# Patient Record
Sex: Male | Born: 1950 | Race: White | Hispanic: No | Marital: Married | Smoking: Former smoker
Health system: Southern US, Community
[De-identification: ages and names within clinical notes are randomized; demographics above are authoritative.]

## PROBLEM LIST (undated history)

## (undated) DIAGNOSIS — I714 Abdominal aortic aneurysm, without rupture, unspecified: Secondary | ICD-10-CM

## (undated) DIAGNOSIS — I2510 Atherosclerotic heart disease of native coronary artery without angina pectoris: Secondary | ICD-10-CM

## (undated) DIAGNOSIS — I10 Essential (primary) hypertension: Secondary | ICD-10-CM

## (undated) DIAGNOSIS — K7460 Unspecified cirrhosis of liver: Secondary | ICD-10-CM

## (undated) DIAGNOSIS — F329 Major depressive disorder, single episode, unspecified: Secondary | ICD-10-CM

## (undated) DIAGNOSIS — K859 Acute pancreatitis without necrosis or infection, unspecified: Secondary | ICD-10-CM

## (undated) DIAGNOSIS — F32A Depression, unspecified: Secondary | ICD-10-CM

## (undated) HISTORY — PX: CORONARY ARTERY BYPASS GRAFT: SHX141

## (undated) HISTORY — PX: HERNIA REPAIR: SHX51

---

## 2014-12-16 ENCOUNTER — Inpatient Hospital Stay (HOSPITAL_COMMUNITY): Payer: BLUE CROSS/BLUE SHIELD | Admitting: Critical Care Medicine

## 2014-12-16 ENCOUNTER — Inpatient Hospital Stay (HOSPITAL_COMMUNITY): Payer: BLUE CROSS/BLUE SHIELD

## 2014-12-16 ENCOUNTER — Encounter (HOSPITAL_COMMUNITY): Admission: EM | Disposition: E | Payer: Self-pay | Source: Other Acute Inpatient Hospital | Attending: Pulmonary Disease

## 2014-12-16 ENCOUNTER — Encounter (HOSPITAL_COMMUNITY): Payer: Self-pay | Admitting: Adult Health

## 2014-12-16 ENCOUNTER — Other Ambulatory Visit: Payer: Self-pay | Admitting: *Deleted

## 2014-12-16 ENCOUNTER — Inpatient Hospital Stay (HOSPITAL_COMMUNITY)
Admission: EM | Admit: 2014-12-16 | Discharge: 2015-02-04 | DRG: 853 | Disposition: E | Payer: BLUE CROSS/BLUE SHIELD | Source: Other Acute Inpatient Hospital | Attending: Pulmonary Disease | Admitting: Pulmonary Disease

## 2014-12-16 DIAGNOSIS — D689 Coagulation defect, unspecified: Secondary | ICD-10-CM | POA: Diagnosis present

## 2014-12-16 DIAGNOSIS — R6521 Severe sepsis with septic shock: Secondary | ICD-10-CM | POA: Diagnosis present

## 2014-12-16 DIAGNOSIS — E872 Acidosis: Secondary | ICD-10-CM | POA: Diagnosis present

## 2014-12-16 DIAGNOSIS — R0602 Shortness of breath: Secondary | ICD-10-CM

## 2014-12-16 DIAGNOSIS — K767 Hepatorenal syndrome: Secondary | ICD-10-CM | POA: Diagnosis present

## 2014-12-16 DIAGNOSIS — E785 Hyperlipidemia, unspecified: Secondary | ICD-10-CM | POA: Diagnosis present

## 2014-12-16 DIAGNOSIS — F329 Major depressive disorder, single episode, unspecified: Secondary | ICD-10-CM | POA: Diagnosis present

## 2014-12-16 DIAGNOSIS — R109 Unspecified abdominal pain: Secondary | ICD-10-CM | POA: Diagnosis present

## 2014-12-16 DIAGNOSIS — G9340 Encephalopathy, unspecified: Secondary | ICD-10-CM | POA: Diagnosis not present

## 2014-12-16 DIAGNOSIS — A419 Sepsis, unspecified organism: Principal | ICD-10-CM | POA: Diagnosis present

## 2014-12-16 DIAGNOSIS — N179 Acute kidney failure, unspecified: Secondary | ICD-10-CM | POA: Diagnosis present

## 2014-12-16 DIAGNOSIS — E43 Unspecified severe protein-calorie malnutrition: Secondary | ICD-10-CM | POA: Diagnosis present

## 2014-12-16 DIAGNOSIS — R451 Restlessness and agitation: Secondary | ICD-10-CM | POA: Diagnosis not present

## 2014-12-16 DIAGNOSIS — K550 Acute vascular disorders of intestine: Secondary | ICD-10-CM | POA: Diagnosis present

## 2014-12-16 DIAGNOSIS — G9341 Metabolic encephalopathy: Secondary | ICD-10-CM | POA: Diagnosis not present

## 2014-12-16 DIAGNOSIS — E870 Hyperosmolality and hypernatremia: Secondary | ICD-10-CM | POA: Diagnosis not present

## 2014-12-16 DIAGNOSIS — T8144XA Sepsis following a procedure, initial encounter: Secondary | ICD-10-CM

## 2014-12-16 DIAGNOSIS — I9581 Postprocedural hypotension: Secondary | ICD-10-CM | POA: Diagnosis not present

## 2014-12-16 DIAGNOSIS — Z452 Encounter for adjustment and management of vascular access device: Secondary | ICD-10-CM

## 2014-12-16 DIAGNOSIS — Z885 Allergy status to narcotic agent status: Secondary | ICD-10-CM | POA: Diagnosis not present

## 2014-12-16 DIAGNOSIS — K913 Postprocedural intestinal obstruction: Secondary | ICD-10-CM | POA: Diagnosis not present

## 2014-12-16 DIAGNOSIS — I129 Hypertensive chronic kidney disease with stage 1 through stage 4 chronic kidney disease, or unspecified chronic kidney disease: Secondary | ICD-10-CM | POA: Diagnosis present

## 2014-12-16 DIAGNOSIS — D62 Acute posthemorrhagic anemia: Secondary | ICD-10-CM | POA: Diagnosis not present

## 2014-12-16 DIAGNOSIS — T8132XA Disruption of internal operation (surgical) wound, not elsewhere classified, initial encounter: Secondary | ICD-10-CM | POA: Diagnosis not present

## 2014-12-16 DIAGNOSIS — R188 Other ascites: Secondary | ICD-10-CM | POA: Diagnosis present

## 2014-12-16 DIAGNOSIS — E871 Hypo-osmolality and hyponatremia: Secondary | ICD-10-CM | POA: Diagnosis not present

## 2014-12-16 DIAGNOSIS — G8918 Other acute postprocedural pain: Secondary | ICD-10-CM | POA: Diagnosis not present

## 2014-12-16 DIAGNOSIS — R5381 Other malaise: Secondary | ICD-10-CM | POA: Diagnosis not present

## 2014-12-16 DIAGNOSIS — R0600 Dyspnea, unspecified: Secondary | ICD-10-CM

## 2014-12-16 DIAGNOSIS — N183 Chronic kidney disease, stage 3 (moderate): Secondary | ICD-10-CM | POA: Diagnosis present

## 2014-12-16 DIAGNOSIS — R1310 Dysphagia, unspecified: Secondary | ICD-10-CM | POA: Diagnosis not present

## 2014-12-16 DIAGNOSIS — J9811 Atelectasis: Secondary | ICD-10-CM | POA: Diagnosis not present

## 2014-12-16 DIAGNOSIS — K7460 Unspecified cirrhosis of liver: Secondary | ICD-10-CM | POA: Diagnosis present

## 2014-12-16 DIAGNOSIS — Z951 Presence of aortocoronary bypass graft: Secondary | ICD-10-CM

## 2014-12-16 DIAGNOSIS — D638 Anemia in other chronic diseases classified elsewhere: Secondary | ICD-10-CM | POA: Diagnosis present

## 2014-12-16 DIAGNOSIS — J9690 Respiratory failure, unspecified, unspecified whether with hypoxia or hypercapnia: Secondary | ICD-10-CM

## 2014-12-16 DIAGNOSIS — I2510 Atherosclerotic heart disease of native coronary artery without angina pectoris: Secondary | ICD-10-CM | POA: Diagnosis present

## 2014-12-16 DIAGNOSIS — Z87891 Personal history of nicotine dependence: Secondary | ICD-10-CM | POA: Diagnosis not present

## 2014-12-16 DIAGNOSIS — I480 Paroxysmal atrial fibrillation: Secondary | ICD-10-CM | POA: Diagnosis not present

## 2014-12-16 DIAGNOSIS — I714 Abdominal aortic aneurysm, without rupture, unspecified: Secondary | ICD-10-CM

## 2014-12-16 DIAGNOSIS — K7581 Nonalcoholic steatohepatitis (NASH): Secondary | ICD-10-CM | POA: Diagnosis present

## 2014-12-16 DIAGNOSIS — G4733 Obstructive sleep apnea (adult) (pediatric): Secondary | ICD-10-CM | POA: Diagnosis present

## 2014-12-16 DIAGNOSIS — Z4659 Encounter for fitting and adjustment of other gastrointestinal appliance and device: Secondary | ICD-10-CM

## 2014-12-16 DIAGNOSIS — E611 Iron deficiency: Secondary | ICD-10-CM | POA: Diagnosis present

## 2014-12-16 DIAGNOSIS — Z66 Do not resuscitate: Secondary | ICD-10-CM | POA: Diagnosis not present

## 2014-12-16 DIAGNOSIS — R680 Hypothermia, not associated with low environmental temperature: Secondary | ICD-10-CM | POA: Diagnosis not present

## 2014-12-16 DIAGNOSIS — K7290 Hepatic failure, unspecified without coma: Secondary | ICD-10-CM | POA: Diagnosis not present

## 2014-12-16 DIAGNOSIS — D696 Thrombocytopenia, unspecified: Secondary | ICD-10-CM | POA: Diagnosis present

## 2014-12-16 DIAGNOSIS — J9601 Acute respiratory failure with hypoxia: Secondary | ICD-10-CM | POA: Diagnosis not present

## 2014-12-16 DIAGNOSIS — Y848 Other medical procedures as the cause of abnormal reaction of the patient, or of later complication, without mention of misadventure at the time of the procedure: Secondary | ICD-10-CM | POA: Diagnosis not present

## 2014-12-16 DIAGNOSIS — E1165 Type 2 diabetes mellitus with hyperglycemia: Secondary | ICD-10-CM | POA: Diagnosis present

## 2014-12-16 DIAGNOSIS — J9600 Acute respiratory failure, unspecified whether with hypoxia or hypercapnia: Secondary | ICD-10-CM

## 2014-12-16 DIAGNOSIS — K7031 Alcoholic cirrhosis of liver with ascites: Secondary | ICD-10-CM | POA: Diagnosis not present

## 2014-12-16 DIAGNOSIS — K659 Peritonitis, unspecified: Secondary | ICD-10-CM | POA: Diagnosis not present

## 2014-12-16 DIAGNOSIS — K559 Vascular disorder of intestine, unspecified: Secondary | ICD-10-CM | POA: Diagnosis not present

## 2014-12-16 DIAGNOSIS — K6389 Other specified diseases of intestine: Secondary | ICD-10-CM | POA: Diagnosis present

## 2014-12-16 DIAGNOSIS — I4891 Unspecified atrial fibrillation: Secondary | ICD-10-CM | POA: Diagnosis not present

## 2014-12-16 DIAGNOSIS — I4892 Unspecified atrial flutter: Secondary | ICD-10-CM | POA: Diagnosis not present

## 2014-12-16 DIAGNOSIS — Z781 Physical restraint status: Secondary | ICD-10-CM | POA: Diagnosis not present

## 2014-12-16 DIAGNOSIS — I959 Hypotension, unspecified: Secondary | ICD-10-CM | POA: Diagnosis not present

## 2014-12-16 HISTORY — DX: Abdominal aortic aneurysm, without rupture: I71.4

## 2014-12-16 HISTORY — DX: Acute pancreatitis without necrosis or infection, unspecified: K85.90

## 2014-12-16 HISTORY — DX: Unspecified cirrhosis of liver: K74.60

## 2014-12-16 HISTORY — DX: Atherosclerotic heart disease of native coronary artery without angina pectoris: I25.10

## 2014-12-16 HISTORY — PX: LAPAROTOMY: SHX154

## 2014-12-16 HISTORY — DX: Depression, unspecified: F32.A

## 2014-12-16 HISTORY — PX: BOWEL RESECTION: SHX1257

## 2014-12-16 HISTORY — DX: Essential (primary) hypertension: I10

## 2014-12-16 HISTORY — PX: APPLICATION OF WOUND VAC: SHX5189

## 2014-12-16 HISTORY — DX: Major depressive disorder, single episode, unspecified: F32.9

## 2014-12-16 HISTORY — DX: Abdominal aortic aneurysm, without rupture, unspecified: I71.40

## 2014-12-16 LAB — BASIC METABOLIC PANEL
Anion gap: 14 (ref 5–15)
BUN: 34 mg/dL — AB (ref 6–23)
CHLORIDE: 102 mmol/L (ref 96–112)
CO2: 18 mmol/L — AB (ref 19–32)
Calcium: 8.2 mg/dL — ABNORMAL LOW (ref 8.4–10.5)
Creatinine, Ser: 1.75 mg/dL — ABNORMAL HIGH (ref 0.50–1.35)
GFR calc Af Amer: 46 mL/min — ABNORMAL LOW (ref >90)
GFR calc non Af Amer: 39 mL/min — ABNORMAL LOW (ref >90)
Glucose, Bld: 163 mg/dL — ABNORMAL HIGH (ref 70–99)
POTASSIUM: 4.5 mmol/L (ref 3.5–5.1)
SODIUM: 134 mmol/L — AB (ref 135–145)

## 2014-12-16 LAB — GLUCOSE, CAPILLARY
GLUCOSE-CAPILLARY: 107 mg/dL — AB (ref 70–99)
Glucose-Capillary: 102 mg/dL — ABNORMAL HIGH (ref 70–99)
Glucose-Capillary: 139 mg/dL — ABNORMAL HIGH (ref 70–99)
Glucose-Capillary: 142 mg/dL — ABNORMAL HIGH (ref 70–99)

## 2014-12-16 LAB — AMYLASE: Amylase: 112 U/L — ABNORMAL HIGH (ref 0–105)

## 2014-12-16 LAB — CBC
HEMATOCRIT: 31.2 % — AB (ref 39.0–52.0)
HEMOGLOBIN: 10.5 g/dL — AB (ref 13.0–17.0)
MCH: 35.7 pg — AB (ref 26.0–34.0)
MCHC: 33.7 g/dL (ref 30.0–36.0)
MCV: 106.1 fL — AB (ref 78.0–100.0)
Platelets: 85 10*3/uL — ABNORMAL LOW (ref 150–400)
RBC: 2.94 MIL/uL — ABNORMAL LOW (ref 4.22–5.81)
RDW: 16 % — ABNORMAL HIGH (ref 11.5–15.5)
WBC: 9.3 10*3/uL (ref 4.0–10.5)

## 2014-12-16 LAB — PROTIME-INR
INR: 1.53 — ABNORMAL HIGH (ref 0.00–1.49)
PROTHROMBIN TIME: 18.5 s — AB (ref 11.6–15.2)

## 2014-12-16 LAB — COMPREHENSIVE METABOLIC PANEL
ALK PHOS: 110 U/L (ref 39–117)
ALT: 26 U/L (ref 0–53)
ANION GAP: 17 — AB (ref 5–15)
AST: 74 U/L — ABNORMAL HIGH (ref 0–37)
Albumin: 2.6 g/dL — ABNORMAL LOW (ref 3.5–5.2)
BUN: 29 mg/dL — AB (ref 6–23)
CALCIUM: 8.7 mg/dL (ref 8.4–10.5)
CO2: 18 mmol/L — ABNORMAL LOW (ref 19–32)
CREATININE: 1.58 mg/dL — AB (ref 0.50–1.35)
Chloride: 104 mmol/L (ref 96–112)
GFR calc non Af Amer: 45 mL/min — ABNORMAL LOW (ref >90)
GFR, EST AFRICAN AMERICAN: 52 mL/min — AB (ref >90)
GLUCOSE: 111 mg/dL — AB (ref 70–99)
POTASSIUM: 4.4 mmol/L (ref 3.5–5.1)
SODIUM: 139 mmol/L (ref 135–145)
TOTAL PROTEIN: 5.5 g/dL — AB (ref 6.0–8.3)
Total Bilirubin: 2.9 mg/dL — ABNORMAL HIGH (ref 0.3–1.2)

## 2014-12-16 LAB — URINALYSIS, ROUTINE W REFLEX MICROSCOPIC
Bilirubin Urine: NEGATIVE
GLUCOSE, UA: NEGATIVE mg/dL
HGB URINE DIPSTICK: NEGATIVE
Ketones, ur: NEGATIVE mg/dL
Leukocytes, UA: NEGATIVE
Nitrite: NEGATIVE
PROTEIN: NEGATIVE mg/dL
SPECIFIC GRAVITY, URINE: 1.02 (ref 1.005–1.030)
Urobilinogen, UA: 0.2 mg/dL (ref 0.0–1.0)
pH: 5 (ref 5.0–8.0)

## 2014-12-16 LAB — POCT I-STAT 3, ART BLOOD GAS (G3+)
Acid-base deficit: 11 mmol/L — ABNORMAL HIGH (ref 0.0–2.0)
Bicarbonate: 16.1 mEq/L — ABNORMAL LOW (ref 20.0–24.0)
O2 Saturation: 92 %
TCO2: 17 mmol/L (ref 0–100)
pCO2 arterial: 41.5 mmHg (ref 35.0–45.0)
pH, Arterial: 7.196 — CL (ref 7.350–7.450)
pO2, Arterial: 79 mmHg — ABNORMAL LOW (ref 80.0–100.0)

## 2014-12-16 LAB — TRIGLYCERIDES: Triglycerides: 48 mg/dL (ref <150)

## 2014-12-16 LAB — PROCALCITONIN: Procalcitonin: 4.9 ng/mL

## 2014-12-16 LAB — SURGICAL PCR SCREEN
MRSA, PCR: NEGATIVE
Staphylococcus aureus: NEGATIVE

## 2014-12-16 LAB — LACTIC ACID, PLASMA
LACTIC ACID, VENOUS: 7.2 mmol/L — AB (ref 0.5–2.0)
Lactic Acid, Venous: 6.4 mmol/L (ref 0.5–2.0)

## 2014-12-16 LAB — ABO/RH: ABO/RH(D): O POS

## 2014-12-16 LAB — LIPASE, BLOOD: LIPASE: 51 U/L (ref 11–59)

## 2014-12-16 SURGERY — LAPAROTOMY, EXPLORATORY
Anesthesia: General | Site: Abdomen

## 2014-12-16 MED ORDER — SODIUM CHLORIDE 0.9 % IV BOLUS (SEPSIS)
500.0000 mL | Freq: Once | INTRAVENOUS | Status: AC
  Administered 2014-12-16: 500 mL via INTRAVENOUS

## 2014-12-16 MED ORDER — ETOMIDATE 2 MG/ML IV SOLN
INTRAVENOUS | Status: DC | PRN
  Administered 2014-12-16: 20 mg via INTRAVENOUS

## 2014-12-16 MED ORDER — LIDOCAINE HCL (CARDIAC) 20 MG/ML IV SOLN
INTRAVENOUS | Status: AC
  Filled 2014-12-16: qty 5

## 2014-12-16 MED ORDER — SODIUM CHLORIDE 0.9 % IV SOLN
Freq: Once | INTRAVENOUS | Status: DC
Start: 2014-12-16 — End: 2014-12-20

## 2014-12-16 MED ORDER — LIDOCAINE HCL (CARDIAC) 20 MG/ML IV SOLN
INTRAVENOUS | Status: DC | PRN
  Administered 2014-12-16: 70 mg via INTRAVENOUS

## 2014-12-16 MED ORDER — PHENYLEPHRINE HCL 10 MG/ML IJ SOLN
10.0000 mg | INTRAMUSCULAR | Status: DC | PRN
  Administered 2014-12-16: 50 ug/min via INTRAVENOUS

## 2014-12-16 MED ORDER — PHENYLEPHRINE HCL 10 MG/ML IJ SOLN
0.0000 ug/min | INTRAVENOUS | Status: DC
  Administered 2014-12-16: 80 ug/min via INTRAVENOUS
  Administered 2014-12-17: 200 ug/min via INTRAVENOUS
  Administered 2014-12-17: 65 ug/min via INTRAVENOUS
  Administered 2014-12-17: 200 ug/min via INTRAVENOUS
  Administered 2014-12-18: 45 ug/min via INTRAVENOUS
  Administered 2014-12-18 (×2): 200 ug/min via INTRAVENOUS
  Administered 2014-12-19: 130 ug/min via INTRAVENOUS
  Administered 2014-12-19: 120 ug/min via INTRAVENOUS
  Administered 2014-12-20: 5 ug/min via INTRAVENOUS
  Administered 2014-12-20: 120 ug/min via INTRAVENOUS
  Administered 2014-12-20: 115 ug/min via INTRAVENOUS
  Filled 2014-12-16 (×16): qty 4

## 2014-12-16 MED ORDER — FENTANYL CITRATE 0.05 MG/ML IJ SOLN
INTRAMUSCULAR | Status: AC
  Filled 2014-12-16: qty 5

## 2014-12-16 MED ORDER — FENTANYL CITRATE 0.05 MG/ML IJ SOLN
25.0000 ug | INTRAMUSCULAR | Status: DC | PRN
  Administered 2014-12-16 – 2014-12-17 (×5): 100 ug via INTRAVENOUS
  Administered 2014-12-20 – 2014-12-21 (×3): 50 ug via INTRAVENOUS
  Administered 2014-12-21 – 2014-12-23 (×4): 100 ug via INTRAVENOUS
  Administered 2014-12-23 (×2): 25 ug via INTRAVENOUS
  Administered 2014-12-23: 50 ug via INTRAVENOUS
  Administered 2014-12-24 – 2014-12-30 (×22): 100 ug via INTRAVENOUS
  Filled 2014-12-16 (×37): qty 2

## 2014-12-16 MED ORDER — ONDANSETRON HCL 4 MG/2ML IJ SOLN: 4.0000 mg | Freq: Four times a day (QID) | INTRAMUSCULAR | Status: DC | PRN

## 2014-12-16 MED ORDER — FENTANYL CITRATE 0.05 MG/ML IJ SOLN: 100.0000 ug | INTRAMUSCULAR | Status: DC | PRN

## 2014-12-16 MED ORDER — CETYLPYRIDINIUM CHLORIDE 0.05 % MT LIQD: 7.0000 mL | Freq: Four times a day (QID) | OROMUCOSAL | Status: DC

## 2014-12-16 MED ORDER — NOREPINEPHRINE BITARTRATE 1 MG/ML IV SOLN
2.0000 ug/min | INTRAVENOUS | Status: DC
  Administered 2014-12-16: 5 ug/min via INTRAVENOUS
  Administered 2014-12-17: 25 ug/min via INTRAVENOUS
  Filled 2014-12-16 (×3): qty 16

## 2014-12-16 MED ORDER — SODIUM CHLORIDE 0.9 % IV SOLN: 250.0000 mL | INTRAVENOUS | Status: DC | PRN

## 2014-12-16 MED ORDER — SODIUM CHLORIDE 0.9 % IV SOLN
INTRAVENOUS | Status: DC | PRN
  Administered 2014-12-16: 17:00:00 via INTRAVENOUS

## 2014-12-16 MED ORDER — CHLORHEXIDINE GLUCONATE 0.12 % MT SOLN: 15.0000 mL | Freq: Two times a day (BID) | OROMUCOSAL | Status: DC

## 2014-12-16 MED ORDER — ROCURONIUM BROMIDE 100 MG/10ML IV SOLN
INTRAVENOUS | Status: DC | PRN
  Administered 2014-12-16: 10 mg via INTRAVENOUS
  Administered 2014-12-16: 20 mg via INTRAVENOUS

## 2014-12-16 MED ORDER — NOREPINEPHRINE BITARTRATE 1 MG/ML IV SOLN: 2.0000 ug/min | INTRAVENOUS | Status: DC

## 2014-12-16 MED ORDER — SUCCINYLCHOLINE CHLORIDE 20 MG/ML IJ SOLN
INTRAMUSCULAR | Status: AC
  Filled 2014-12-16: qty 1

## 2014-12-16 MED ORDER — PIPERACILLIN-TAZOBACTAM 3.375 G IVPB
3.3750 g | Freq: Three times a day (TID) | INTRAVENOUS | Status: AC
  Administered 2014-12-16 – 2014-12-23 (×23): 3.375 g via INTRAVENOUS
  Filled 2014-12-16 (×24): qty 50

## 2014-12-16 MED ORDER — 0.9 % SODIUM CHLORIDE (POUR BTL) OPTIME
TOPICAL | Status: DC | PRN
  Administered 2014-12-16: 2000 mL

## 2014-12-16 MED ORDER — ARTIFICIAL TEARS OP OINT
TOPICAL_OINTMENT | OPHTHALMIC | Status: DC | PRN
  Administered 2014-12-16: 1 via OPHTHALMIC

## 2014-12-16 MED ORDER — LACTATED RINGERS IV SOLN
INTRAVENOUS | Status: DC | PRN
  Administered 2014-12-16: 16:00:00 via INTRAVENOUS

## 2014-12-16 MED ORDER — INSULIN ASPART 100 UNIT/ML ~~LOC~~ SOLN
0.0000 [IU] | SUBCUTANEOUS | Status: DC
  Administered 2014-12-16 – 2014-12-19 (×12): 1 [IU] via SUBCUTANEOUS
  Administered 2014-12-19 (×2): 2 [IU] via SUBCUTANEOUS
  Administered 2014-12-19: 1 [IU] via SUBCUTANEOUS
  Administered 2014-12-19: 2 [IU] via SUBCUTANEOUS
  Administered 2014-12-20 – 2014-12-21 (×7): 1 [IU] via SUBCUTANEOUS
  Administered 2014-12-21: 2 [IU] via SUBCUTANEOUS
  Administered 2014-12-21 – 2014-12-22 (×4): 1 [IU] via SUBCUTANEOUS
  Administered 2014-12-22: 2 [IU] via SUBCUTANEOUS
  Administered 2014-12-22: 1 [IU] via SUBCUTANEOUS
  Administered 2014-12-23: 2 [IU] via SUBCUTANEOUS
  Administered 2014-12-23 (×2): 1 [IU] via SUBCUTANEOUS

## 2014-12-16 MED ORDER — SODIUM CHLORIDE 0.9 % IV BOLUS (SEPSIS)
500.0000 mL | INTRAVENOUS | Status: DC | PRN
  Administered 2014-12-16 – 2014-12-18 (×7): 500 mL via INTRAVENOUS
  Filled 2014-12-16 (×7): qty 500

## 2014-12-16 MED ORDER — ALBUTEROL SULFATE (2.5 MG/3ML) 0.083% IN NEBU: 2.5000 mg | INHALATION_SOLUTION | RESPIRATORY_TRACT | Status: DC | PRN

## 2014-12-16 MED ORDER — PROPOFOL 10 MG/ML IV BOLUS
INTRAVENOUS | Status: AC
  Filled 2014-12-16: qty 20

## 2014-12-16 MED ORDER — FENTANYL CITRATE 0.05 MG/ML IJ SOLN
INTRAMUSCULAR | Status: DC | PRN
  Administered 2014-12-16 (×2): 50 ug via INTRAVENOUS

## 2014-12-16 MED ORDER — ARTIFICIAL TEARS OP OINT
TOPICAL_OINTMENT | OPHTHALMIC | Status: AC
  Filled 2014-12-16: qty 3.5

## 2014-12-16 MED ORDER — KCL IN DEXTROSE-NACL 20-5-0.45 MEQ/L-%-% IV SOLN
INTRAVENOUS | Status: DC
  Administered 2014-12-16: 15:00:00 via INTRAVENOUS
  Administered 2014-12-17: 100 mL via INTRAVENOUS
  Filled 2014-12-16 (×5): qty 1000

## 2014-12-16 MED ORDER — SUCCINYLCHOLINE CHLORIDE 20 MG/ML IJ SOLN
INTRAMUSCULAR | Status: DC | PRN
  Administered 2014-12-16: 120 mg via INTRAVENOUS

## 2014-12-16 MED ORDER — LACTATED RINGERS IV SOLN
INTRAVENOUS | Status: DC | PRN
  Administered 2014-12-16: 17:00:00 via INTRAVENOUS

## 2014-12-16 MED ORDER — PANTOPRAZOLE SODIUM 40 MG IV SOLR
40.0000 mg | INTRAVENOUS | Status: DC
Start: 2014-12-16 — End: 2014-12-23
  Administered 2014-12-16 – 2014-12-22 (×7): 40 mg via INTRAVENOUS
  Filled 2014-12-16 (×10): qty 40

## 2014-12-16 MED ORDER — HYDROMORPHONE HCL 1 MG/ML IJ SOLN: 0.2500 mg | INTRAMUSCULAR | Status: DC | PRN

## 2014-12-16 MED ORDER — SODIUM CHLORIDE 0.9 % IV BOLUS (SEPSIS)
1000.0000 mL | Freq: Once | INTRAVENOUS | Status: AC
  Administered 2014-12-16: 1000 mL via INTRAVENOUS

## 2014-12-16 MED ORDER — PROPOFOL 10 MG/ML IV EMUL
0.0000 ug/kg/min | INTRAVENOUS | Status: DC
  Administered 2014-12-16: 10 ug/kg/min via INTRAVENOUS
  Administered 2014-12-17: 20 ug/kg/min via INTRAVENOUS
  Filled 2014-12-16 (×4): qty 100

## 2014-12-16 MED ORDER — ROCURONIUM BROMIDE 50 MG/5ML IV SOLN
INTRAVENOUS | Status: AC
  Filled 2014-12-16: qty 1

## 2014-12-16 SURGICAL SUPPLY — 54 items
BLADE SURG ROTATE 9660 (MISCELLANEOUS) IMPLANT
CANISTER SUCTION 2500CC (MISCELLANEOUS) ×3 IMPLANT
CANISTER WOUND CARE 500ML ATS (WOUND CARE) ×3 IMPLANT
CHLORAPREP W/TINT 26ML (MISCELLANEOUS) ×3 IMPLANT
COVER MAYO STAND STRL (DRAPES) IMPLANT
COVER SURGICAL LIGHT HANDLE (MISCELLANEOUS) ×3 IMPLANT
DRAPE LAPAROSCOPIC ABDOMINAL (DRAPES) ×3 IMPLANT
DRAPE PROXIMA HALF (DRAPES) IMPLANT
DRAPE UTILITY XL STRL (DRAPES) IMPLANT
DRAPE WARM FLUID 44X44 (DRAPE) ×3 IMPLANT
DRSG OPSITE POSTOP 4X10 (GAUZE/BANDAGES/DRESSINGS) IMPLANT
DRSG OPSITE POSTOP 4X8 (GAUZE/BANDAGES/DRESSINGS) IMPLANT
ELECT BLADE 6.5 EXT (BLADE) IMPLANT
ELECT CAUTERY BLADE 6.4 (BLADE) ×3 IMPLANT
ELECT REM PT RETURN 9FT ADLT (ELECTROSURGICAL) ×3
ELECTRODE REM PT RTRN 9FT ADLT (ELECTROSURGICAL) ×1 IMPLANT
GEL ULTRASOUND 20GR AQUASONIC (MISCELLANEOUS) ×3 IMPLANT
GLOVE BIO SURGEON STRL SZ8 (GLOVE) ×3 IMPLANT
GLOVE BIOGEL PI IND STRL 6.5 (GLOVE) ×1 IMPLANT
GLOVE BIOGEL PI IND STRL 8 (GLOVE) ×1 IMPLANT
GLOVE BIOGEL PI INDICATOR 6.5 (GLOVE) ×2
GLOVE BIOGEL PI INDICATOR 8 (GLOVE) ×2
GLOVE ECLIPSE 6.5 STRL STRAW (GLOVE) ×3 IMPLANT
GOWN STRL REUS W/ TWL LRG LVL3 (GOWN DISPOSABLE) ×1 IMPLANT
GOWN STRL REUS W/ TWL XL LVL3 (GOWN DISPOSABLE) ×1 IMPLANT
GOWN STRL REUS W/TWL LRG LVL3 (GOWN DISPOSABLE) ×2
GOWN STRL REUS W/TWL XL LVL3 (GOWN DISPOSABLE) ×2
KIT BASIN OR (CUSTOM PROCEDURE TRAY) ×3 IMPLANT
KIT ROOM TURNOVER OR (KITS) ×3 IMPLANT
LIGASURE IMPACT 36 18CM CVD LR (INSTRUMENTS) ×3 IMPLANT
NS IRRIG 1000ML POUR BTL (IV SOLUTION) ×6 IMPLANT
PACK GENERAL/GYN (CUSTOM PROCEDURE TRAY) ×3 IMPLANT
PAD ARMBOARD 7.5X6 YLW CONV (MISCELLANEOUS) ×3 IMPLANT
PENCIL BUTTON HOLSTER BLD 10FT (ELECTRODE) IMPLANT
RELOAD PROXIMATE 75MM BLUE (ENDOMECHANICALS) ×3 IMPLANT
SPECIMEN JAR LARGE (MISCELLANEOUS) IMPLANT
SPONGE ABDOMINAL VAC ABTHERA (MISCELLANEOUS) ×3 IMPLANT
SPONGE LAP 18X18 X RAY DECT (DISPOSABLE) ×6 IMPLANT
STAPLER PROXIMATE 75MM BLUE (STAPLE) ×3 IMPLANT
STAPLER VISISTAT 35W (STAPLE) ×3 IMPLANT
SUCTION POOLE TIP (SUCTIONS) ×3 IMPLANT
SUT PDS AB 1 TP1 96 (SUTURE) IMPLANT
SUT VIC AB 2-0 CT1 27 (SUTURE) ×2
SUT VIC AB 2-0 CT1 TAPERPNT 27 (SUTURE) ×1 IMPLANT
SUT VIC AB 2-0 SH 18 (SUTURE) ×3 IMPLANT
SUT VIC AB 3-0 SH 18 (SUTURE) ×3 IMPLANT
SUT VICRYL AB 2 0 TIES (SUTURE) ×3 IMPLANT
SUT VICRYL AB 3 0 TIES (SUTURE) ×3 IMPLANT
TOWEL OR 17X24 6PK STRL BLUE (TOWEL DISPOSABLE) ×3 IMPLANT
TOWEL OR 17X26 10 PK STRL BLUE (TOWEL DISPOSABLE) ×3 IMPLANT
TRAY FOLEY CATH 16FRSI W/METER (SET/KITS/TRAYS/PACK) ×3 IMPLANT
TUBE CONNECTING 12'X1/4 (SUCTIONS)
TUBE CONNECTING 12X1/4 (SUCTIONS) IMPLANT
YANKAUER SUCT BULB TIP NO VENT (SUCTIONS) IMPLANT

## 2014-12-16 NOTE — H&P (Signed)
PULMONARY / CRITICAL CARE MEDICINE   Name: Kyle West MRN: [id number] DOB: [birth year]    ADMISSION DATE:  12/30/2014   REFERRING MD :  OSH  CHIEF COMPLAINT:  abd pain   INITIAL PRESENTATION: 64yo male with hx HTN, CAD s/p CABG, cirrhosis of unclear etiology (followed at [facility]), reported recent hx pancreatitis.  Presented to OSH 2/11 with 2 day hx worsening abd pain, nausea and vomiting.  CT at outside hospital revealed pneumatosis and infrarenal AAA.  He was tx to [facility] for further eval with concern for ischemic bowel.    STUDIES:  CT abd/pelvis 2/11 (osh)>>> dilation and pneumatosis of mid and distal small bowel with moderate ascites.  Air in smv and mpv and extending into hepatic veins.  No gi obstruction.  Infrarenal AAA.  Cirrhotic changes in liver.    SIGNIFICANT EVENTS:    HISTORY OF PRESENT ILLNESS:  64yo male with hx HTN, CAD s/p CABG, cirrhosis, reported recent hx pancreatitis.  Presented to OSH 2/11 with 2 day hx worsening abd pain, nausea and vomiting.  CT at outside hospital revealed pneumatosis and infrarenal AAA.  He was tx to [facility] for further eval with concern for ischemic bowel.  Has had intermittent generalized abdominal pain, lasting about 1.5 hours each time, then would resolve on its own associated with nausea and vomiting.    PAST MEDICAL HISTORY :   has a past medical history of Cirrhosis; CAD (coronary artery disease); Hypertension; Pancreatitis; Depression; and AAA (abdominal aortic aneurysm) without rupture.  has past surgical history that includes Hernia repair and Coronary artery bypass graft. Prior to Admission medications   Not on File   Allergies  Allergen Reactions  . Oxycodone Hives and Other (See Comments)    Hallucinations    FAMILY HISTORY:  has no family status information on file.  SOCIAL HISTORY:  reports that he has quit smoking. His smoking use included Cigarettes. He smoked 1.50 packs per day. He quit smokeless tobacco use about 33  years ago. He reports that he does not drink alcohol.  REVIEW OF SYSTEMS:  As per HPI - All other systems reviewed and were neg.    SUBJECTIVE:   VITAL SIGNS: Pulse Rate:  [103-116] 107 (02/11 1400) Resp:  [23-26] 25 (02/11 1400) BP: (91-116)/(55-62) 91/61 mmHg (02/11 1400) SpO2:  [90 %-95 %] 95 % (02/11 1400) Weight:  [233 lb 11 oz (106 kg)] 233 lb 11 oz (106 kg) (02/11 1200) HEMODYNAMICS:   VENTILATOR SETTINGS:   INTAKE / OUTPUT:  Intake/Output Summary (Last 24 hours) at 12/07/2014 1441 Last data filed at 12/18/2014 1430  Gross per 24 hour  Intake      0 ml  Output    100 ml  Net   -100 ml    PHYSICAL EXAMINATION: General:  Pleasant, chronically ill appearing male, NAD  Neuro:  Awake, alert, appropriate, MAE  HEENT:  Mm moist, no JVD  Cardiovascular:  s1s2 rrr Lungs:  resps even non labored on [place], diminished bases  Abdomen:  Round, mildly distended, hypoactive BS, only mildly tender diffusely and more so in RUQ  Musculoskeletal:  Warm and dry, no edema   LABS:  CBC No results for input(s): WBC, HGB, HCT, PLT in the last 168 hours. Coag's  Recent Labs Lab 12/15/2014 1325  INR 1.53*   BMET  Recent Labs Lab 12/31/2014 1325  NA 139  K 4.4  CL 104  CO2 18*  BUN 29*  CREATININE 1.58*  GLUCOSE 111*   Electrolytes  Recent Labs Lab 02/05/2015 1325  CALCIUM 8.7   Sepsis Markers No results for input(s): LATICACIDVEN, PROCALCITON, O2SATVEN in the last 168 hours. ABG No results for input(s): PHART, PCO2ART, PO2ART in the last 168 hours. Liver Enzymes  Recent Labs Lab 02/05/2015 1325  AST 74*  ALT 26  ALKPHOS 110  BILITOT 2.9*  ALBUMIN 2.6*   Cardiac Enzymes No results for input(s): TROPONINI, PROBNP in the last 168 hours. Glucose No results for input(s): GLUCAP in the last 168 hours.  Imaging No results found.   ASSESSMENT / PLAN:   Cirrhosis  Small bowel pneumatosis  Portal venous and mesenteric vein gas  Ascites   Plan:  Surgery following   No acute indication for urgent surgical intervention  NPO  IV abx  - Zosyn  LFT's pending  F/u coags  F/u lactate, pct  Holding all PO's for now - need to resume rifaximin home med once able  Cbc, bmet in am    AAA - 8cm(length) x4cm x4cm -  Plan:  Seen by [facility] - doubt aneurysm is contributing to clinical picture or abd pain, suspect chronic. No evidence rupture outpt f/u    AKI - mild.  ??Baseline Scr.  Plan:  Gentle volume  Chem pending now  Chem in am   Hx HTN  CAD s/p CABG  Hyperlipidemia  Plan:  Hold home lopressor, lasix, aldactone, statin    DM Plan:  SSI     FAMILY  - Updates: no family available 2/11.  Pt updated at length   - Inter-disciplinary family meet or Palliative Care meeting due by:  2/18   Will monitor overnight in ICU, trend lactate, wbc and watch BP and abd exam.  If remains stable could likely tx out in am and back to [facility] (who originally accepted pt in transfer).   Dirk DressKaty Whiteheart, NP 07-18-15  2:41 PM Pager: [phone] or [phone]    PCCM ATTENDING: I have reviewed pt's initial presentation, consultants notes and hospital database in detail.  The above assessment and plan was formulated under my direction.  In summary: Bowel ischemia/infarction AAA Initially, he was admitted with a plan for expectant mgmt but with rising lactate and was taken to OR for ex lap PCCM will see post op  Billy Fischeravid Simonds, MD;  PCCM service; [place] [phone]

## 2014-12-16 NOTE — Progress Notes (Signed)
CRITICAL VALUE ALERT  Critical value received:  Lactic Acid 7.2  Date of notification:  12/16/13  Time of notification:  1450  Critical value read back:Yes.    Nurse who received alert:  Viviano SimasLauren Ladye Macnaughton RN  MD notified (1st page):  Simonds  Time of first page:  1450  MD notified (2nd page):  Time of second page:  Responding MD:  Sung AmabileSimonds  Time MD responded:  1455

## 2014-12-16 NOTE — Progress Notes (Signed)
Patient ID: Kyle West, male   DOB: 02-19-1951, 64 y.o.   MRN: [id number]  Increased abdominal pain, lactic acid 7.2.  To OR urgently for exploration.  Risks of surgery discussed with the patient, son and wife.  These include infection, bleeding, injury to surrounding structures, possible ostomy.  Verbalizes understanding and wishes to proceed.  Type and screen Type and hold 2 units of FFP On zosyn No chemical anticoagulation. INR 1.5  Lavina Resor, ANP-BC

## 2014-12-16 NOTE — H&P (View-Only) (Signed)
Patient ID: Kyle West, male   DOB: 10/08/1951, 63 y.o.   MRN: [id number]  Increased abdominal pain, lactic acid 7.2.  To OR urgently for exploration.  Risks of surgery discussed with the patient, son and wife.  These include infection, bleeding, injury to surrounding structures, possible ostomy.  Verbalizes understanding and wishes to proceed.  Type and screen Type and hold 2 units of FFP On zosyn No chemical anticoagulation. INR 1.5  Khyran Riera, ANP-BC  

## 2014-12-16 NOTE — Progress Notes (Signed)
Called by RN for increasing abd pain, borderline hypotension and worsening lactate (2.4->7.2).  Surgery notified.  Fluid bolus ordered.  Anticipate OR for exp lap.  Will f/u post op.    Katy July Nickson, NP PaDirk Dressger: [phone] or [phone]) [phone]

## 2014-12-16 NOTE — Interval H&P Note (Signed)
History and Physical Interval Note:  12/17/2014 3:54 PM  Kyle West  has presented today for surgery, with the diagnosis of ischemic bowel  The various methods of treatment have been discussed with the patient and family. After consideration of risks, benefits and other options for treatment, the patient has consented to  Procedure(s): EXPLORATORY LAPAROTOMY (N/A) as a surgical intervention .  The patient's history has been reviewed, patient examined, no change in status, stable for surgery.  I have reviewed the patient's chart and labs.  Questions were answered to the patient's satisfaction.   Pt with worsening metabolic acidosis and lactic acidosis and worsen organ failure.  Recommend ex lap to evaluated and potentially treat intraabdominal pathology.  May need extensive small bowel resection. The procedure has been discussed with the patient.  Alternative therapies have been discussed with the patient. Bowel resection,  Ostomy.   Operative risks include bleeding,  Infection,  Organ injury,  Nerve injury,  Blood vessel injury,  DVT,  Pulmonary embolism,  Death,  And possible reoperation.  Medical management risks include worsening of present situation.  The success of the procedure is 50 -90 % at treating patients symptoms.  The patient understands and agrees to proceed.  Gerritt Galentine A.

## 2014-12-16 NOTE — Consult Note (Signed)
Consult Note  Patient name: Kyle West MRN: [id number] DOB: 30-Jan-1951 Sex: male  Consulting Physician:  Critical care medicine  Reason for Consult: No chief complaint on file.   HISTORY OF PRESENT ILLNESS: This is a 64 year old gentleman who arrives in transfer from [facility].  He had a CT scan which revealed portal venous gas as well as pneumatosis.  In addition, there was reports of a 8 cm aneurysm.  Has been having intermittent abdominal pain for a prolonged period of time but it got worse yesterday.  This was associated with nausea and vomiting.  Patient has a history of cirrhosis.  He has a hepatologist at [facility]  The patient is medically managed for hypertension and coronary artery disease.  He is a former smoker.      Past Medical History  Diagnosis Date  . Cirrhosis   . CAD (coronary artery disease)   . Hypertension   . Pancreatitis   . Depression     Past Surgical History  Procedure Laterality Date  . Hernia repair    . Coronary artery bypass graft      History   Social History  . Marital Status: Married    Spouse Name: N/A  . Number of Children: N/A  . Years of Education: N/A   Occupational History  . Not on file.   Social History Main Topics  . Smoking status: Former Smoker -- 1.50 packs/day    Types: Cigarettes  . Smokeless tobacco: Former Neurosurgeon    Quit date: 12/16/1981  . Alcohol Use: No  . Drug Use: Not on file  . Sexual Activity: Not on file   Other Topics Concern  . Not on file   Social History Narrative  . No narrative on file    History reviewed. No pertinent family history.  Allergies as of Dec 25, 2014 - Review Complete 12/25/14  Allergen Reaction Noted  . Oxycodone Hives and Other (See Comments) Dec 25, 2014    No current facility-administered medications on file prior to encounter.   No current outpatient prescriptions on file prior to encounter.     REVIEW OF SYSTEMS: Cardiovascular: No chest pain,  chest pressure, palpitations, orthopnea, or dyspnea on exertion. No claudication or rest pain,  No history of DVT or phlebitis. Pulmonary: No productive cough, asthma or wheezing. Neurologic: No weakness, paresthesias, aphasia, or amaurosis. No dizziness. Hematologic: No bleeding problems or clotting disorders. Musculoskeletal: No joint pain or joint swelling. Gastrointestinal:  right upper quadrant pain  Genitourinary: No dysuria or hematuria. Psychiatric:: No history of major depression. Integumentary: No rashes or ulcers. Constitutional: No fever or chills.  PHYSICAL EXAMINATION: General: The patient appears their stated age.  Vital signs are BP 103/57 mmHg  Pulse 103  Resp 26  Ht  (1.854 m)  Wt 233 lb 11 oz (106 kg)  BMI 30.84 kg/m2  SpO2 93% Pulmonary: Respirations are non-labored HEENT:  No gross abnormalities Abdomen:  mild discomfort with deep palpation particularly in the right upper quadrant.   Musculoskeletal: There are no major deformities.   Neurologic: No focal weakness or paresthesias are detected, Skin: There are no ulcer or rashes noted. Psychiatric: The patient has normal affect. Cardiovascular: There is a regular rate and rhythm without significant murmur appreciated. palpable femoral pulses.  I cannot palpate pedal pulses   Diagnostic Studies: I have personally reviewed his outside CT scan.  The vascular findings include a 4.3 cm infrarenal abdominal aortic aneurysm with no signs of  rupture or pending rupture.    Assessment:  Abdominal aortic aneurysm Plan I do not think that the patient's aneurysm is contributing to the patient's symptoms or current clinical picture.  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  There is no evidence of an embolic process within the mesenteric vascular, distally.  Should the patient require exploration for the pneumatosis and portal venous  gas, I would be available to evaluate blood flow to his intestines.  However with the patient's cirrhosis, major vascular reconstruction would likely have significant morbidity.  Please contact me if I can be of further assistance.  I will schedule the patient for follow-up evaluation of his aneurysm in 6 months, however I suppose it would be best for him to have this followed closer to home.     Jorge NyV. Wells Burgandy Hackworth IV, M.D. [facility] Office: [phone] Pager:  [phone]

## 2014-12-16 NOTE — Progress Notes (Signed)
PCCM Progress Note  Pt back from OR after ex-lap with small bowel resection and application of wound vac.  Still on ventilator and now starting to wake up.  Vent and sedation orders entered.  Central line placed as pt currently on vasopressors through PIV.   Rutherford Guysahul Joie Hipps, [place] - C [facility] Pgr: [phone]  or [phone] 07-06-15, 6:27 PM

## 2014-12-16 NOTE — Consult Note (Signed)
Reason for Consult: pneumatosis, portal venous and mesenteric vein gas Referring Physician: Dr. Billy Fischer   HPI: This is a 64 year old male with a history of CAD s/p CABG, DM II, hypertension, right inguinal hernia repair, necrotizing pancreatitis and cirrhosis who was transferred from outside facility with reported pneumatosis, moderate ascites, hepatic portal venous gas and infrarenal AAA.  We have been asked to evaluate for above CT findings and concerns for ischemic bowel.  The patient reports intermittent abdominal pain at HS that would last about 1.5 hours and would resolve on its own.  Yesterday, he developed worsening abdominal pain associated with nausea and vomiting.  Location is general.  No aggravating or alleviating factors.  Modifying factors include; rolaids.  He has reported pancreatitis, however, etiology is unclear and the patient cannot tell us much more about that.  His work up shows; CT of abdomen and pelvis shows small bowel pneumatosis and portal venous and mesenteric vein gas along with a 8cm(in length) x4x4cm AAA, moderate amount of ascites with cirrhotic changes noted, otherwise, unremarkable.  White count is normal.  Lactic acid is mildly elevated at 2.4.  LFTs are mildly elevated likely 2/2 his cirrhosis.    He has a hepatologist at [facility], etiology of cirrhosis is unclear.    Past Medical History  Diagnosis Date  . Cirrhosis   . CAD (coronary artery disease)   . Hypertension   . Pancreatitis   . Depression     Past Surgical History  Procedure Laterality Date  . Hernia repair    . Coronary artery bypass graft      History reviewed. No pertinent family history.  Social History:  reports that he has quit smoking. His smoking use included Cigarettes. He smoked 1.50 packs per day. He quit smokeless tobacco use about 33 years ago. He reports that he does not drink alcohol. His drug history is not on file.  Allergies:  Allergies  Allergen Reactions  . Oxycodone      Medications:  Prior to Admission medications   Not on File     No results found for this or any previous visit (from the past 48 hour(s)).  No results found.  Review of Systems  All other systems reviewed and are negative.  Blood pressure 103/57, pulse 103, resp. rate 26, height  (1.854 m), weight 233 lb 11 oz (106 kg), SpO2 93 %. Physical Exam  Constitutional: He is oriented to person, place, and time. He appears well-developed and well-nourished. No distress.  HENT:  Head: Normocephalic and atraumatic.  Eyes: Scleral icterus is present.  Cardiovascular: Normal rate, regular rhythm, normal heart sounds and intact distal pulses.  Exam reveals no gallop and no friction rub.   No murmur heard. Respiratory: Effort normal and breath sounds normal. No respiratory distress. He has no wheezes. He has no rales. He exhibits no tenderness.  GI: Soft. Bowel sounds are normal. He exhibits no distension and no mass. There is no rebound and no guarding.  Mild TTP to RUQ  Musculoskeletal: Normal range of motion. He exhibits no edema or tenderness.  Neurological: He is alert and oriented to person, place, and time.  Skin: Skin is warm and dry. He is not diaphoretic.  Psychiatric: He has a normal mood and affect. His behavior is normal. Judgment and thought content normal.    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  He does not have an acute abdomen and no surgical indications.  Recommend NPO, IV antibiotics.  We will closely follow his abdominal exam.  If he worsens, he may need an operation.  Thank you for the consult.  We will follow along.    Hazelgrace Bonham ANP-BC 09-03-15, 12:35 PM

## 2014-12-16 NOTE — Anesthesia Preprocedure Evaluation (Addendum)
Anesthesia Evaluation  Patient identified by MRN, date of birth, ID band Patient awake    Reviewed: Allergy & Precautions, NPO status , Patient's Chart, lab work & pertinent test results  Airway Mallampati: II   Neck ROM: full    Dental  (+) Dental Advisory Given   Pulmonary former smoker,  breath sounds clear to auscultation        Cardiovascular hypertension, + CAD, + CABG and + Peripheral Vascular Disease Rhythm:regular Rate:Normal     Neuro/Psych Depression    GI/Hepatic   Endo/Other    Renal/GU      Musculoskeletal   Abdominal   Peds  Hematology   Anesthesia Other Findings   Reproductive/Obstetrics                            Anesthesia Physical Anesthesia Plan  ASA: III and emergent  Anesthesia Plan: General   Post-op Pain Management:    Induction: Intravenous  Airway Management Planned: Oral ETT  Additional Equipment:   Intra-op Plan:   Post-operative Plan: Possible Post-op intubation/ventilation  Informed Consent: I have reviewed the patients History and Physical, chart, labs and discussed the procedure including the risks, benefits and alternatives for the proposed anesthesia with the patient or authorized representative who has indicated his/her understanding and acceptance.   Dental advisory given  Plan Discussed with: Anesthesiologist and Surgeon  Anesthesia Plan Comments:         Anesthesia Quick Evaluation

## 2014-12-16 NOTE — Progress Notes (Signed)
CRITICAL VALUE ALERT  Critical value received:  Lactic acid 6.4  Date of notification:  12/23/2014  Time of notification:  2230  Critical value read back:Yes.    Nurse who received alert:  Gennie AlmaHeather Keldric Poyer  MD notified (1st page):  Simonds  Time of first page:  2250

## 2014-12-16 NOTE — Brief Op Note (Signed)
01/01/2015  5:38 PM  PATIENT:  Kyle West  64 y.o. male  PRE-OPERATIVE DIAGNOSIS:  ischemic bowel  POST-OPERATIVE DIAGNOSIS:  ischemic bowel  PROCEDURE:  Procedure(s): EXPLORATORY LAPAROTOMY (N/A) SMALL BOWEL RESECTION (N/A) APPLICATION OF WOUND VAC (N/A)  SURGEON:  Surgeon(s) and Role:    * Harriette Bouillonhomas Anabel Lykins, MD - Primary      ANESTHESIA:   general  EBL:  Total I/O In: 232 [Blood:232] Out: 450 [Urine:450]  BLOOD ADMINISTERED:2 u FFP  DRAINS: vac pack dressing   LOCAL MEDICATIONS USED:  NONE  SPECIMEN:  Source of Specimen:  ileum   DISPOSITION OF SPECIMEN:  PATHOLOGY  COUNTS:  YES  TOURNIQUET:  * No tourniquets in log *  DICTATION: .Other Dictation: Dictation Number [id number]  PLAN OF CARE: Admit to inpatient   PATIENT DISPOSITION:  ICU - intubated and hemodynamically stable.   Delay start of Pharmacological VTE agent (>24hrs) due to surgical blood loss or risk of bleeding: no

## 2014-12-16 NOTE — Transfer of Care (Signed)
Immediate Anesthesia Transfer of Care Note  Patient: Kyle West  Procedure(s) Performed: Procedure(s): EXPLORATORY LAPAROTOMY (N/A) SMALL BOWEL RESECTION (N/A) APPLICATION OF WOUND VAC (N/A)  Patient Location: PACU and SICU  Anesthesia Type:General  Level of Consciousness: Patient remains intubated per anesthesia plan  Airway & Oxygen Therapy: Patient remains intubated per anesthesia plan and Patient placed on Ventilator (see vital sign flow sheet for setting)  Post-op Assessment: Report given to RN and Post -op Vital signs reviewed and stable  Post vital signs: Reviewed and stable  Last Vitals:  Filed Vitals:   12/15/2014 1803  BP: 158/68  Pulse: 83  Temp:   Resp: 20    Complications: No apparent anesthesia complications

## 2014-12-16 NOTE — Procedures (Signed)
Central Venous Catheter Insertion Procedure Note Kyle West [id number] [birth year]  Procedure: Insertion of Central Venous Catheter Indications: Assessment of intravascular volume, Drug and/or fluid administration and Frequent blood sampling  Procedure Details Consent: Risks of procedure as well as the alternatives and risks of each were explained to the (patient/caregiver).  Consent for procedure obtained. Time Out: Verified patient identification, verified procedure, site/side was marked, verified correct patient position, special equipment/implants available, medications/allergies/relevent history reviewed, required imaging and test results available.  Performed  Maximum sterile technique was used including antiseptics, cap, gloves, gown, hand hygiene, mask and sheet. Skin prep: Chlorhexidine; local anesthetic administered A antimicrobial bonded/coated triple lumen catheter was placed in the left internal jugular vein using the Seldinger technique.  Evaluation Blood flow good Complications: No apparent complications Patient did tolerate procedure well. Chest X-ray ordered to verify placement.  CXR: pending.  Procedure performed under direct ultrasound guidance for real time vessel cannulation.      Kyle West, [place] - C [place] Pgr: [phone]  or [phone] 12/07/2014, 6:58 PM

## 2014-12-16 NOTE — Progress Notes (Signed)
Dr. Sung AmabileSimonds notified of decreased SBP in the 80's, output from the abd wound vac 650cc [place] received, will continue to monitor.

## 2014-12-17 ENCOUNTER — Encounter (HOSPITAL_COMMUNITY): Payer: Self-pay | Admitting: Surgery

## 2014-12-17 ENCOUNTER — Inpatient Hospital Stay (HOSPITAL_COMMUNITY): Payer: BLUE CROSS/BLUE SHIELD

## 2014-12-17 ENCOUNTER — Telehealth: Payer: Self-pay | Admitting: Surgery

## 2014-12-17 DIAGNOSIS — K559 Vascular disorder of intestine, unspecified: Secondary | ICD-10-CM | POA: Diagnosis present

## 2014-12-17 LAB — PREPARE FRESH FROZEN PLASMA
Unit division: 0
Unit division: 0

## 2014-12-17 LAB — CBC
HCT: 32.4 % — ABNORMAL LOW (ref 39.0–52.0)
Hemoglobin: 11 g/dL — ABNORMAL LOW (ref 13.0–17.0)
MCH: 36.5 pg — AB (ref 26.0–34.0)
MCHC: 34 g/dL (ref 30.0–36.0)
MCV: 107.6 fL — AB (ref 78.0–100.0)
PLATELETS: 101 10*3/uL — AB (ref 150–400)
RBC: 3.01 MIL/uL — ABNORMAL LOW (ref 4.22–5.81)
RDW: 16 % — AB (ref 11.5–15.5)
WBC: 15.3 10*3/uL — ABNORMAL HIGH (ref 4.0–10.5)

## 2014-12-17 LAB — LACTIC ACID, PLASMA
Lactic Acid, Venous: 3.7 mmol/L (ref 0.5–2.0)
Lactic Acid, Venous: 1.6 mmol/L (ref 0.5–2.0)

## 2014-12-17 LAB — BLOOD GAS, ARTERIAL
ACID-BASE DEFICIT: 4.9 mmol/L — AB (ref 0.0–2.0)
Bicarbonate: 19.6 mEq/L — ABNORMAL LOW (ref 20.0–24.0)
Drawn by: 31996
FIO2: 0.4 %
O2 SAT: 97.3 %
PATIENT TEMPERATURE: 98.6
PEEP: 5 cmH2O
RATE: 15 resp/min
TCO2: 20.7 mmol/L (ref 0–100)
VT: 600 mL
pCO2 arterial: 35.9 mmHg (ref 35.0–45.0)
pH, Arterial: 7.357 (ref 7.350–7.450)
pO2, Arterial: 100 mmHg (ref 80.0–100.0)

## 2014-12-17 LAB — GLUCOSE, CAPILLARY
GLUCOSE-CAPILLARY: 141 mg/dL — AB (ref 70–99)
GLUCOSE-CAPILLARY: 127 mg/dL — AB (ref 70–99)
Glucose-Capillary: 136 mg/dL — ABNORMAL HIGH (ref 70–99)
Glucose-Capillary: 132 mg/dL — ABNORMAL HIGH (ref 70–99)
Glucose-Capillary: 133 mg/dL — ABNORMAL HIGH (ref 70–99)

## 2014-12-17 LAB — BASIC METABOLIC PANEL
Anion gap: 8 (ref 5–15)
Anion gap: 3 — ABNORMAL LOW (ref 5–15)
BUN: 36 mg/dL — ABNORMAL HIGH (ref 6–23)
BUN: 34 mg/dL — ABNORMAL HIGH (ref 6–23)
CALCIUM: 7.7 mg/dL — AB (ref 8.4–10.5)
CALCIUM: 7.3 mg/dL — AB (ref 8.4–10.5)
CO2: 19 mmol/L (ref 19–32)
CO2: 24 mmol/L (ref 19–32)
CREATININE: 1.48 mg/dL — AB (ref 0.50–1.35)
Chloride: 106 mmol/L (ref 96–112)
Chloride: 105 mmol/L (ref 96–112)
Creatinine, Ser: 1.25 mg/dL (ref 0.50–1.35)
GFR calc Af Amer: 69 mL/min — ABNORMAL LOW (ref >90)
GFR calc non Af Amer: 48 mL/min — ABNORMAL LOW (ref >90)
GFR, EST AFRICAN AMERICAN: 56 mL/min — AB (ref >90)
GFR, EST NON AFRICAN AMERICAN: 59 mL/min — AB (ref >90)
Glucose, Bld: 143 mg/dL — ABNORMAL HIGH (ref 70–99)
Glucose, Bld: 122 mg/dL — ABNORMAL HIGH (ref 70–99)
Potassium: 4.8 mmol/L (ref 3.5–5.1)
Potassium: 4.4 mmol/L (ref 3.5–5.1)
SODIUM: 133 mmol/L — AB (ref 135–145)
Sodium: 132 mmol/L — ABNORMAL LOW (ref 135–145)

## 2014-12-17 MED ORDER — ALBUMIN HUMAN 5 % IV SOLN
INTRAVENOUS | Status: AC
  Filled 2014-12-17: qty 250

## 2014-12-17 MED ORDER — AMIODARONE LOAD VIA INFUSION
150.0000 mg | Freq: Once | INTRAVENOUS | Status: AC
  Administered 2014-12-17: 150 mg via INTRAVENOUS

## 2014-12-17 MED ORDER — ALBUMIN HUMAN 5 % IV SOLN
25.0000 g | Freq: Once | INTRAVENOUS | Status: AC
  Administered 2014-12-17: 25 g via INTRAVENOUS
  Filled 2014-12-17: qty 500

## 2014-12-17 MED ORDER — AMIODARONE HCL IN DEXTROSE 360-4.14 MG/200ML-% IV SOLN
60.0000 mg/h | INTRAVENOUS | Status: AC
  Administered 2014-12-17 (×2): 60 mg/h via INTRAVENOUS
  Filled 2014-12-17: qty 200

## 2014-12-17 MED ORDER — ALBUMIN HUMAN 5 % IV SOLN
12.5000 g | Freq: Once | INTRAVENOUS | Status: AC
  Administered 2014-12-17: 12.5 g via INTRAVENOUS

## 2014-12-17 MED ORDER — AMIODARONE HCL IN DEXTROSE 360-4.14 MG/200ML-% IV SOLN
INTRAVENOUS | Status: AC
Start: 2014-12-17 — End: 2014-12-17
  Administered 2014-12-17: 60 mg/h via INTRAVENOUS
  Filled 2014-12-17: qty 200

## 2014-12-17 MED ORDER — KCL IN DEXTROSE-NACL 20-5-0.9 MEQ/L-%-% IV SOLN
INTRAVENOUS | Status: DC
  Filled 2014-12-17 (×2): qty 1000

## 2014-12-17 MED ORDER — TRACE MINERALS CR-CU-F-FE-I-MN-MO-SE-ZN IV SOLN
INTRAVENOUS | Status: AC
  Administered 2014-12-17: 17:00:00 via INTRAVENOUS
  Filled 2014-12-17: qty 960

## 2014-12-17 MED ORDER — METOPROLOL TARTRATE 1 MG/ML IV SOLN
2.5000 mg | INTRAVENOUS | Status: DC | PRN
  Administered 2014-12-17 (×2): 2.5 mg via INTRAVENOUS
  Filled 2014-12-17: qty 5

## 2014-12-17 MED ORDER — AMIODARONE HCL IN DEXTROSE 360-4.14 MG/200ML-% IV SOLN
30.0000 mg/h | INTRAVENOUS | Status: DC
  Administered 2014-12-18 – 2014-12-20 (×6): 30 mg/h via INTRAVENOUS
  Filled 2014-12-17 (×16): qty 200

## 2014-12-17 MED ORDER — METOPROLOL TARTRATE 1 MG/ML IV SOLN
INTRAVENOUS | Status: AC
  Administered 2014-12-17: 2.5 mg via INTRAVENOUS
  Filled 2014-12-17: qty 5

## 2014-12-17 MED ORDER — ALBUMIN HUMAN 5 % IV SOLN
INTRAVENOUS | Status: AC
Start: 2014-12-17 — End: 2014-12-17
  Filled 2014-12-17: qty 250

## 2014-12-17 MED ORDER — CHLORHEXIDINE GLUCONATE 0.12 % MT SOLN
15.0000 mL | Freq: Two times a day (BID) | OROMUCOSAL | Status: DC
  Administered 2014-12-17 – 2014-12-31 (×28): 15 mL via OROMUCOSAL
  Filled 2014-12-17 (×32): qty 15

## 2014-12-17 MED ORDER — KCL IN DEXTROSE-NACL 20-5-0.9 MEQ/L-%-% IV SOLN
INTRAVENOUS | Status: DC
  Administered 2014-12-17: 11:00:00 via INTRAVENOUS
  Filled 2014-12-17 (×2): qty 1000

## 2014-12-17 MED ORDER — FENTANYL CITRATE 0.05 MG/ML IJ SOLN
0.0000 ug/h | INTRAMUSCULAR | Status: DC
  Administered 2014-12-17: 50 ug/h via INTRAVENOUS
  Administered 2014-12-18: 200 ug/h via INTRAVENOUS
  Administered 2014-12-18: 100 ug/h via INTRAVENOUS
  Administered 2014-12-19 (×2): 125 ug/h via INTRAVENOUS
  Filled 2014-12-17 (×5): qty 50

## 2014-12-17 MED ORDER — ALBUMIN HUMAN 5 % IV SOLN
INTRAVENOUS | Status: AC
Start: 2014-12-17 — End: 2014-12-17
  Administered 2014-12-17: 12.5 g via INTRAVENOUS
  Filled 2014-12-17: qty 250

## 2014-12-17 NOTE — Op Note (Signed)
NAMESHAMIR, TUZZOLINO NO.:  [id number]  MEDICAL RECORD NO.:  [id number]  LOCATION:  2S16C                        FACILITY:  [facility]  PHYSICIAN:  Maisie Fus A. Khai Torbert, M.D.DATE OF BIRTH:  06-20-1951  DATE OF PROCEDURE:  12/08/2014 DATE OF DISCHARGE:                              OPERATIVE REPORT   PREOPERATIVE DIAGNOSIS:  Worsening metabolic acidosis, concerning for mesenteric ischemia.  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  PROCEDURES: 1. Exploratory laparotomy with resection of ileum. 2. Placement of abdominal vacuum pack dressing.  SURGEON:  Maisie Fus A. Deshaun Weisinger, M.D.  ANESTHESIA:  General endotracheal anesthesia.  ESTIMATED BLOOD LOSS:  About 100 mL.  IV FLUIDS:  1 L crystalloid and 2 units of FFP administered.  SPECIMEN:  Ileum to Pathology.  INDICATIONS FOR PROCEDURE:  The patient is a 64 year old male, [place] [facility], and accepted by the medical service due to a one-day history of abdominal pain.  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  He had a normal white count at initial evaluation he arrived here and a relatively normal abdominal examination, though the next couple of hours, he developed more abdominal pain and a worsening metabolic acidosis.  We recommended exploratory laparotomy to exclude ischemic bowel disease.  Risk, benefits, and alternative therapies discussed and are outlined in my History and Physical.  Given the critical nature of the process, he wished to proceed as soon as possible.  DESCRIPTION OF PROCEDURE:  The patient was brought to the operating room and placed supine on the OR table.  After induction of general anesthesia, his abdomen was prepped and draped in a sterile fashion and a Foley catheter was placed.  He was already on preoperative Zosyn. Time-out was  done.  A midline incision was made.  Dissection was carried down to the midline.  This was opened.  The abdominal cavity was entered.  We suctioned out about 2.5 L of ascites.  We put our retractor in and ran the small bowel from the ligament of Treitz, where it was relatively a normal jejunum.  As we ran the small bowel and got to the ileum, there were multiple ischemic, almost embolic changes to the ileum, which persisted within 15 cm of the ileocecal valve.  This appeared viable.  A GIA stapler was used to resect the ischemic appearing bowel from the distal jejunum until we were then 15 cm of the ileocecal valve.  This was all ischemic but not necrotic yet.  A Doppler was used to verify a very strong triphasic signal in the superior mesenteric artery and even the smaller branches had relatively good signals throughout.  The mesentery was taken down with a LigaSure. Given the condition of the patient, I felt best at this point to replace the small bowel and do a second-look operation in 48 hours.  I have elected to place an abdominal vacuum pack dressing in the standard fashion.  The patient was taken intubated back to the ICU in critical but stable condition.  All final counts were found to be correct.  Delshon Blanchfield A. Vershawn Westrup, M.D.     TAC/MEDQ  D:  12/06/2014  T:  12/17/2014  Job:  [id number]

## 2014-12-17 NOTE — Progress Notes (Signed)
INITIAL NUTRITION ASSESSMENT  DOCUMENTATION CODES Per approved criteria  -Obesity Unspecified   INTERVENTION: TPN per pharmacy; see energy/protein recommendations  Diet advancement per MD RD to continue to monitor  NUTRITION DIAGNOSIS: Inadequate oral intake related to inability to eat as evidenced by recent bowel surgery and NPO status.   Goal: Enteral nutrition to provide 60-70% of estimated calorie needs (22-25 kcals/kg ideal body weight) and 100% of estimated protein needs, based on ASPEN guidelines for hypocaloric, high protein feeding in critically ill obese individuals   Monitor:  Vent status, TPN initiation/rate, weight trend, labs  Reason for Assessment: Vent  64 y.o. male  Admitting Dx: <principal problem not specified>  ASSESSMENT: 64yo male with hx HTN, CAD s/p CABG, cirrhosis, reported recent hx pancreatitis. Presented with 2 day hx worsening abd pain, nausea and vomiting.  EXPLORATORY LAPAROTOMY (N/A) SMALL BOWEL RESECTION (N/A) APPLICATION OF WOUND VAC (N/A)   Wound Vac output was 350 ml at time of visit. NGT to suction. Pt appears well-nourished. Per MD note, plan to start TNA.   Patient is currently intubated on ventilator support MV: 10.7 L/min Temp (24hrs), Avg:98.6 F (37 C), Min:97.4 F (36.3 C), Max:99.8 F (37.7 C)  Propofol: 12.7 ml/hr (provides 335 kcal per 24 hours)  Height: Ht Readings from Last 1 Encounters:  12/07/2014 6\' 1"  (1.854 m)    Weight: Wt Readings from Last 1 Encounters:  12/17/14 235 lb 14.3 oz (107 kg)    Ideal Body Weight: 184 lbs  % Ideal Body Weight: 128%  Wt Readings from Last 10 Encounters:  12/17/14 235 lb 14.3 oz (107 kg)    Usual Body Weight: unknown  % Usual Body Weight: NA  BMI:  Body mass index is 31.13 kg/(m^2).  (Obese)  Estimated Nutritional Needs while on Vent: Kcal: 1515-1840  Protein: >/=167 grams Fluid: 2.8 L/day  Estimated nutritional needs when extubated: Kcal:  2200-2400 Protein:  120-135 grams Fluids: 2.8 L/day  Skin: abdominal incision with negative pressure wound therapy  Diet Order: Diet NPO time specified  EDUCATION NEEDS: -No education needs identified at this time   Intake/Output Summary (Last 24 hours) at 12/17/14 1229 Last data filed at 12/17/14 1200  Gross per 24 hour  Intake 7294.27 ml  Output   4815 ml  Net 2479.27 ml    Last BM: PTA   Labs:   Recent Labs Lab 12/19/2014 1325 12/28/2014 2120 12/17/14 0440  NA 139 134* 133*  K 4.4 4.5 4.8  CL 104 102 106  CO2 18* 18* 19  BUN 29* 34* 36*  CREATININE 1.58* 1.75* 1.48*  CALCIUM 8.7 8.2* 7.7*  GLUCOSE 111* 163* 143*    CBG (last 3)   Recent Labs  12/12/2014 2337 12/17/14 0333 12/17/14 0710  GLUCAP 139* 136* 132*    Scheduled Meds: . sodium chloride   Intravenous Once  . chlorhexidine  15 mL Mouth Rinse BID  . insulin aspart  0-9 Units Subcutaneous 6 times per day  . pantoprazole (PROTONIX) IV  40 mg Intravenous Q24H  . piperacillin-tazobactam (ZOSYN)  IV  3.375 g Intravenous 3 times per day    Continuous Infusions: . dextrose 5 % and 0.9 % NaCl with KCl 20 mEq/L 100 mL/hr at 12/17/14 1000  . norepinephrine (LEVOPHED) Adult infusion Stopped (12/17/14 1200)  . phenylephrine (NEO-SYNEPHRINE) Adult infusion 180 mcg/min (12/17/14 1200)  . propofol 20 mcg/kg/min (12/17/14 1200)    Past Medical History  Diagnosis Date  . Cirrhosis   . CAD (coronary artery disease)   .  Hypertension   . Pancreatitis   . Depression   . AAA (abdominal aortic aneurysm) without rupture     Past Surgical History  Procedure Laterality Date  . Hernia repair    . Coronary artery bypass graft    . Laparotomy N/A 12/10/2014    Procedure: EXPLORATORY LAPAROTOMY;  Surgeon: Harriette Bouillon, MD;  Location: [facility] OR;  Service: General;  Laterality: N/A;  . Bowel resection N/A 12/15/2014    Procedure: SMALL BOWEL RESECTION;  Surgeon: Harriette Bouillon, MD;  Location: [facility] OR;  Service: General;  Laterality: N/A;   . Application of wound vac N/A 12/09/2014    Procedure: APPLICATION OF WOUND VAC;  Surgeon: Harriette Bouillon, MD;  Location: [facility] OR;  Service: General;  Laterality: N/A;    Ian Malkin RD, LDN Inpatient Clinical Dietitian Pager: [phone] After Hours Pager: [phone]

## 2014-12-17 NOTE — Progress Notes (Signed)
eLink Physician-Brief Progress Note Patient Name: Kyle West DOB: Jun 25, 1951 MRN: [id number]   Date of Service  12/17/2014  HPI/Events of Note  Septic shock, ARDS,  Ex lap  eICU Interventions  F/u lactate and bmet>>lactate now < 2.0 improved , bmet oK     Intervention Category Major Interventions: Acid-Base disturbance - evaluation and management;Sepsis - evaluation and management  Shan Levansatrick Faelynn Wynder 12/17/2014, 11:34 PM

## 2014-12-17 NOTE — Anesthesia Postprocedure Evaluation (Signed)
  Anesthesia Post-op Note  Patient: Kyle West  Procedure(s) Performed: Procedure(s): EXPLORATORY LAPAROTOMY (N/A) SMALL BOWEL RESECTION (N/A) APPLICATION OF WOUND VAC (N/A)  Patient Location: ICU  Anesthesia Type:General  Level of Consciousness: sedated and Patient remains intubated per anesthesia plan  Airway and Oxygen Therapy: Patient remains intubated per anesthesia plan and Patient placed on Ventilator (see vital sign flow sheet for setting)  Post-op Pain: none  Post-op Assessment: Post-op Vital signs reviewed  Post-op Vital Signs: Reviewed  Last Vitals:  Filed Vitals:   12/17/14 0700  BP: 140/40  Pulse: 50  Temp:   Resp: 21    Complications: No apparent anesthesia complications

## 2014-12-17 NOTE — Progress Notes (Signed)
Patient ID: Kyle West, male   DOB: 11-Mar-1951, 65 y.o.   MRN: [id number]     [address] [place], [place] [address]    Phone: [phone] FAX: [phone]     Subjective: Neo and levo for low BP.  Awake.    Objective:  Vital signs:  Filed Vitals:   12/17/14 0600 12/17/14 0630 12/17/14 0700 12/17/14 0713  BP: 148/48 131/44 140/40   Pulse: 88  50   Temp:    98.3 F (36.8 C)  TempSrc:    Axillary  Resp: 19 16 21    Height:      Weight:      SpO2: 100% 100% 100%        Intake/Output   Yesterday:  02/11 0701 - 02/12 0700 In: 5892.7 [I.V.:3010.7; Blood:232; IV XBMWUXLKG:4010] Out: 2725 [Urine:1280; Emesis/NG output:600; Drains:1550; Blood:150] This shift:     Physical Exam: General: Pt awake on vent Abdomen: Soft.  Nondistended.  Wound vac to mid abdomen--draining serosanguinous output.  No evidence of peritonitis.  No incarcerated hernias. Ext:  SCDs BLE.  No mjr edema.  No cyanosis Skin: No petechiae / purpura   Problem List:   Active Problems:   Abdominal pain    Results:   Labs: Results for orders placed or performed during the hospital encounter of 12/28/2014 (from the past 48 hour(s))  Triglycerides     Status: None   Collection Time: 12/17/2014 10:57 AM  Result Value Ref Range   Triglycerides 48 <150 mg/dL  Urinalysis, Routine w reflex microscopic     Status: None   Collection Time: 12/22/2014  1:03 PM  Result Value Ref Range   Color, Urine YELLOW YELLOW   APPearance CLEAR CLEAR   Specific Gravity, Urine 1.020 1.005 - 1.030   pH 5.0 5.0 - 8.0   Glucose, UA NEGATIVE NEGATIVE mg/dL   Hgb urine dipstick NEGATIVE NEGATIVE   Bilirubin Urine NEGATIVE NEGATIVE   Ketones, ur NEGATIVE NEGATIVE mg/dL   Protein, ur NEGATIVE NEGATIVE mg/dL   Urobilinogen, UA 0.2 0.0 - 1.0 mg/dL   Nitrite NEGATIVE NEGATIVE   Leukocytes, UA NEGATIVE NEGATIVE    Comment: MICROSCOPIC NOT DONE ON URINES WITH  NEGATIVE PROTEIN, BLOOD, LEUKOCYTES, NITRITE, OR GLUCOSE <1000 mg/dL.  Amylase     Status: Abnormal   Collection Time: 12/15/2014  1:25 PM  Result Value Ref Range   Amylase 112 (H) 0 - 105 U/L  Lipase, blood     Status: None   Collection Time: 12/15/2014  1:25 PM  Result Value Ref Range   Lipase 51 11 - 59 U/L  Procalcitonin     Status: None   Collection Time: 12/15/2014  1:25 PM  Result Value Ref Range   Procalcitonin 4.90 ng/mL    Comment:        Interpretation: PCT > 2 ng/mL: Systemic infection (sepsis) is likely, unless other causes are known. (NOTE)         ICU PCT Algorithm               Non ICU PCT Algorithm    ----------------------------     ------------------------------         PCT < 0.25 ng/mL                 PCT < 0.1 ng/mL     Stopping of antibiotics            Stopping of  antibiotics       strongly encouraged.               strongly encouraged.    ----------------------------     ------------------------------       PCT level decrease by               PCT < 0.25 ng/mL       >= 80% from peak PCT       OR PCT 0.25 - 0.5 ng/mL          Stopping of antibiotics                                             encouraged.     Stopping of antibiotics           encouraged.    ----------------------------     ------------------------------       PCT level decrease by              PCT >= 0.25 ng/mL       < 80% from peak PCT        AND PCT >= 0.5 ng/mL            Continuing antibiotics                                               encouraged.       Continuing antibiotics            encouraged.    ----------------------------     ------------------------------     PCT level increase compared          PCT > 0.5 ng/mL         with peak PCT AND          PCT >= 0.5 ng/mL             Escalation of antibiotics                                          strongly encouraged.      Escalation of antibiotics        strongly encouraged.   Protime-INR     Status: Abnormal   Collection Time:  12/08/2014  1:25 PM  Result Value Ref Range   Prothrombin Time 18.5 (H) 11.6 - 15.2 seconds   INR 1.53 (H) 0.00 - 1.49  Comprehensive metabolic panel     Status: Abnormal   Collection Time: 12/22/2014  1:25 PM  Result Value Ref Range   Sodium 139 135 - 145 mmol/L   Potassium 4.4 3.5 - 5.1 mmol/L    Comment: HEMOLYSIS AT THIS LEVEL MAY AFFECT RESULT   Chloride 104 96 - 112 mmol/L   CO2 18 (L) 19 - 32 mmol/L   Glucose, Bld 111 (H) 70 - 99 mg/dL   BUN 29 (H) 6 - 23 mg/dL   Creatinine, Ser 1.58 (H) 0.50 - 1.35 mg/dL   Calcium 8.7 8.4 - 10.5 mg/dL   Total Protein 5.5 (L) 6.0 - 8.3 g/dL   Albumin 2.6 (L) 3.5 - 5.2 g/dL   AST 74 (H) 0 -  37 U/L   ALT 26 0 - 53 U/L   Alkaline Phosphatase 110 39 - 117 U/L   Total Bilirubin 2.9 (H) 0.3 - 1.2 mg/dL   GFR calc non Af Amer 45 (L) >90 mL/min   GFR calc Af Amer 52 (L) >90 mL/min    Comment: (NOTE) The eGFR has been calculated using the CKD EPI equation. This calculation has not been validated in all clinical situations. eGFR's persistently <90 mL/min signify possible Chronic Kidney Disease.    Anion gap 17 (H) 5 - 15  Lactic acid, plasma     Status: Abnormal   Collection Time: 12/22/2014  1:35 PM  Result Value Ref Range   Lactic Acid, Venous 7.2 (HH) 0.5 - 2.0 mmol/L    Comment: REPEATED TO VERIFY CRITICAL RESULT CALLED TO, READ BACK BY AND VERIFIED WITH: L ROBERTSON,RN 1448 12/25/2014 WBOND   Glucose, capillary     Status: Abnormal   Collection Time: 12/22/2014  1:42 PM  Result Value Ref Range   Glucose-Capillary 107 (H) 70 - 99 mg/dL  Type and screen     Status: None   Collection Time: 12/08/2014  3:08 PM  Result Value Ref Range   ABO/RH(D) O POS    Antibody Screen NEG    Sample Expiration 12/19/2014   Prepare fresh frozen plasma     Status: None   Collection Time: 12/07/2014  3:12 PM  Result Value Ref Range   Unit Number [id number]    Blood Component Type THW PLS APHR    Unit division 00    Status of Unit ISSUED,FINAL    Transfusion  Status OK TO TRANSFUSE    Unit Number [id number]    Blood Component Type THAWED PLASMA    Unit division 00    Status of Unit ISSUED,FINAL    Transfusion Status OK TO TRANSFUSE   Surgical pcr screen     Status: None   Collection Time: 01/02/2015  3:13 PM  Result Value Ref Range   MRSA, PCR NEGATIVE NEGATIVE   Staphylococcus aureus NEGATIVE NEGATIVE    Comment:        The Xpert SA Assay (FDA approved for NASAL specimens in patients over 59 years of age), is one component of a comprehensive surveillance program.  Test performance has been validated by [facility] for patients greater than or equal to 80 year old. It is not intended to diagnose infection nor to guide or monitor treatment.   Glucose, capillary     Status: Abnormal   Collection Time: 01/01/2015  3:22 PM  Result Value Ref Range   Glucose-Capillary 102 (H) 70 - 99 mg/dL   Comment 1 Notify RN   ABO/Rh     Status: None   Collection Time: 12/14/2014  4:05 PM  Result Value Ref Range   ABO/RH(D) O POS   Glucose, capillary     Status: Abnormal   Collection Time: 01/01/2015  7:06 PM  Result Value Ref Range   Glucose-Capillary 142 (H) 70 - 99 mg/dL   Comment 1 Capillary Specimen    Comment 2 Notify RN    Comment 3 Document in Chart   I-STAT 3, arterial blood gas (G3+)     Status: Abnormal   Collection Time: 01/01/2015  7:17 PM  Result Value Ref Range   pH, Arterial 7.196 (LL) 7.350 - 7.450   pCO2 arterial 41.5 35.0 - 45.0 mmHg   pO2, Arterial 79.0 (L) 80.0 - 100.0 mmHg   Bicarbonate 16.1 (L) 20.0 -  24.0 mEq/L   TCO2 17 0 - 100 mmol/L   O2 Saturation 92.0 %   Acid-base deficit 11.0 (H) 0.0 - 2.0 mmol/L   Collection site RADIAL, ALLEN'S TEST ACCEPTABLE    Sample type ARTERIAL   CBC     Status: Abnormal   Collection Time: 01/01/2015  9:20 PM  Result Value Ref Range   WBC 9.3 4.0 - 10.5 K/uL   RBC 2.94 (L) 4.22 - 5.81 MIL/uL   Hemoglobin 10.5 (L) 13.0 - 17.0 g/dL   HCT 31.2 (L) 39.0 - 52.0 %   MCV 106.1 (H) 78.0 - 100.0  fL   MCH 35.7 (H) 26.0 - 34.0 pg   MCHC 33.7 30.0 - 36.0 g/dL   RDW 16.0 (H) 11.5 - 15.5 %   Platelets 85 (L) 150 - 400 K/uL    Comment: PLATELET COUNT CONFIRMED BY SMEAR REPEATED TO VERIFY SPECIMEN CHECKED FOR CLOTS   Basic metabolic panel     Status: Abnormal   Collection Time: 12/27/2014  9:20 PM  Result Value Ref Range   Sodium 134 (L) 135 - 145 mmol/L   Potassium 4.5 3.5 - 5.1 mmol/L   Chloride 102 96 - 112 mmol/L   CO2 18 (L) 19 - 32 mmol/L   Glucose, Bld 163 (H) 70 - 99 mg/dL   BUN 34 (H) 6 - 23 mg/dL   Creatinine, Ser 1.75 (H) 0.50 - 1.35 mg/dL   Calcium 8.2 (L) 8.4 - 10.5 mg/dL   GFR calc non Af Amer 39 (L) >90 mL/min   GFR calc Af Amer 46 (L) >90 mL/min    Comment: (NOTE) The eGFR has been calculated using the CKD EPI equation. This calculation has not been validated in all clinical situations. eGFR's persistently <90 mL/min signify possible Chronic Kidney Disease.    Anion gap 14 5 - 15  Lactic acid, plasma     Status: Abnormal   Collection Time: 12/27/2014  9:45 PM  Result Value Ref Range   Lactic Acid, Venous 6.4 (HH) 0.5 - 2.0 mmol/L    Comment: REPEATED TO VERIFY CRITICAL RESULT CALLED TO, READ BACK BY AND VERIFIED WITH: H WOLF,RN 2230 12/22/2014 WBOND   Glucose, capillary     Status: Abnormal   Collection Time: 12/22/2014 11:37 PM  Result Value Ref Range   Glucose-Capillary 139 (H) 70 - 99 mg/dL   Comment 1 Capillary Specimen    Comment 2 Notify RN    Comment 3 Document in Chart   Glucose, capillary     Status: Abnormal   Collection Time: 12/17/14  3:33 AM  Result Value Ref Range   Glucose-Capillary 136 (H) 70 - 99 mg/dL   Comment 1 Capillary Specimen    Comment 2 Notify RN    Comment 3 Document in Chart   CBC     Status: Abnormal   Collection Time: 12/17/14  4:40 AM  Result Value Ref Range   WBC 15.3 (H) 4.0 - 10.5 K/uL   RBC 3.01 (L) 4.22 - 5.81 MIL/uL   Hemoglobin 11.0 (L) 13.0 - 17.0 g/dL   HCT 32.4 (L) 39.0 - 52.0 %   MCV 107.6 (H) 78.0 - 100.0  fL   MCH 36.5 (H) 26.0 - 34.0 pg   MCHC 34.0 30.0 - 36.0 g/dL   RDW 16.0 (H) 11.5 - 15.5 %   Platelets 101 (L) 150 - 400 K/uL    Comment: CONSISTENT WITH PREVIOUS RESULT  Basic metabolic panel     Status: Abnormal  Collection Time: 12/17/14  4:40 AM  Result Value Ref Range   Sodium 133 (L) 135 - 145 mmol/L   Potassium 4.8 3.5 - 5.1 mmol/L   Chloride 106 96 - 112 mmol/L   CO2 19 19 - 32 mmol/L   Glucose, Bld 143 (H) 70 - 99 mg/dL   BUN 36 (H) 6 - 23 mg/dL   Creatinine, Ser 1.48 (H) 0.50 - 1.35 mg/dL   Calcium 7.7 (L) 8.4 - 10.5 mg/dL   GFR calc non Af Amer 48 (L) >90 mL/min   GFR calc Af Amer 56 (L) >90 mL/min    Comment: (NOTE) The eGFR has been calculated using the CKD EPI equation. This calculation has not been validated in all clinical situations. eGFR's persistently <90 mL/min signify possible Chronic Kidney Disease.    Anion gap 8 5 - 15  Lactic acid, plasma     Status: Abnormal   Collection Time: 12/17/14  4:40 AM  Result Value Ref Range   Lactic Acid, Venous 3.7 (HH) 0.5 - 2.0 mmol/L    Comment: CRITICAL RESULT CALLED TO, READ BACK BY AND VERIFIED WITH: [facility] RN 12/17/2014 0601 JORDANS REPEATED TO VERIFY     Imaging / Studies: Dg Chest Port 1 View  12/17/2014   CLINICAL DATA:  Respiratory failure.  EXAM: PORTABLE CHEST - 1 VIEW  COMPARISON:  December 16, 2014.  FINDINGS: Stable cardiomediastinal silhouette. Endotracheal and nasogastric tubes are unchanged in position. Left internal jugular catheter is also noted with distal tip overlying expected position of the SVC. Status post coronary artery bypass graft. Bibasilar opacities are noted most consistent with subsegmental atelectasis. No pneumothorax is noted. No significant pleural effusion is noted.  IMPRESSION: Stable support apparatus. Stable bibasilar subsegmental atelectasis.   Electronically Signed   By: Marijo Conception, M.D.   On: 12/17/2014 07:53   Dg Chest Port 1 View  12/19/2014   CLINICAL DATA:  Status  post central line placement  EXAM: PORTABLE CHEST - 1 VIEW  COMPARISON:  Chest film from earlier in the same day  FINDINGS: Postsurgical changes are again seen. An endotracheal tube is now noted 5.2 cm above the carina. A nasogastric catheter is seen extending into the stomach. A left jugular central line is noted with the tip in the mid superior vena cava. No pneumothorax is seen. Bibasilar atelectatic changes are again noted.  IMPRESSION: Tubes and lines as described above. No pneumothorax is noted. Stable bibasilar atelectatic changes are seen.   Electronically Signed   By: Inez Catalina M.D.   On: 12/08/2014 19:11   Dg Chest Port 1 View  12/06/2014   CLINICAL DATA:  Abdominal aortic aneurysm. Shortness of breath. Coronary artery disease. Pre-op respiratory exam  EXAM: PORTABLE CHEST - 1 VIEW  COMPARISON:  None.  FINDINGS: Low lung volumes are seen. Streaky opacity lung bases is likely due to atelectasis, although pneumonia cannot definitely be excluded. No evidence of pleural effusion. Heart size is within normal limits allowing for low lung volumes. Previous CABG noted.  IMPRESSION: Low lung volumes with probable bibasilar atelectasis, although pneumonia cannot definitely be excluded.   Electronically Signed   By: Earle Gell M.D.   On: 12/12/2014 13:16    Medications / Allergies:  Scheduled Meds: . sodium chloride   Intravenous Once  . insulin aspart  0-9 Units Subcutaneous 6 times per day  . pantoprazole (PROTONIX) IV  40 mg Intravenous Q24H  . piperacillin-tazobactam (ZOSYN)  IV  3.375 g Intravenous 3 times  per day   Continuous Infusions: . dextrose 5 % and 0.45 % NaCl with KCl 20 mEq/L 100 mL (12/17/14 0500)  . norepinephrine (LEVOPHED) Adult infusion 25 mcg/min (12/17/14 0700)  . phenylephrine (NEO-SYNEPHRINE) Adult infusion 70 mcg/min (12/17/14 0700)  . 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 Cornett -To OR in AM for re-exploration if okay with CCM -monitor VAC output, serosanguinous(1558m out) -NGT -zosyn -IVF -pain control I spoke with his wife.  Agreeable to surgery.  Will obtain consent Hypotension -on pressors  Cirrhosis-child's class B, check INR in AM.  Type and crossed ABL anemia-stable PCM-start TNA, DC IVF once started per CCM, orders written Coagulopathy  CAD AAA    EErby Pian [facility] Pager [phone](7A-4:30P) For consults and floor pages call [phone](7A-4:30P)  12/17/2014 8:15 AM

## 2014-12-17 NOTE — Progress Notes (Signed)
eLink Physician-Brief Progress Note Patient Name: Kyle West DOB: 03-15-1951 MRN: [id number]   Date of Service  12/17/2014  HPI/Events of Note  AF-RVR Worsening shock  eICU Interventions  Neo gtt amio gtt chk ABG for acidosis volume     Intervention Category Major Interventions: Arrhythmia - evaluation and management  [place] V. 12/17/2014, 3:35 PM

## 2014-12-17 NOTE — Progress Notes (Signed)
PULMONARY / CRITICAL CARE MEDICINE   Name: Kyle West MRN: [id number] DOB: [birth year]    ADMISSION DATE:  12/17/2014   REFERRING MD :  OSH  CHIEF COMPLAINT:  abd pain   INITIAL PRESENTATION:  64yo male with hx HTN, CAD s/p CABG, cirrhosis of unclear etiology (followed at [facility]), reported recent hx pancreatitis.  Presented to OSH 2/11 with 2 day hx worsening abd pain, nausea and vomiting.  CT at outside hospital revealed pneumatosis, hepatic and portal venous gas and infrarenal AAA.  He was tx to [facility] for further eval with concern for ischemic bowel. Underwent laparotomy shortly after admission  MAJOR EVENTS/TEST RESULTS: 2/11 CT abd/pelvis: dilation and pneumatosis of mid and distal small bowel with moderate ascites.  Air in smv and mpv and extending into hepatic veins.  No gi obstruction.  Infrarenal AAA.  Cirrhotic changes in liver.   2/11 exploratory laparotomy, small bowel resection, application of wound vac (Dr Luisa Hart). Left intubated post op. Post op hypotension requiring CVL placement, volume resuscitation, vasopressors 2/12 sinus tachycardia, new onset AFRVR. Vasopressors changed from NE to PE. Low dose metoprolol initiated   INDWELLING DEVICES:: ETT 2/11 >>  L IJ CVL 2/11 >>   MICRO DATA: MRSA PCR 2/11 >> NEG  ANTIMICROBIALS:  Pip-tazo 2/11 >>    SUBJECTIVE:  RASS -2. Not F/C but no formal WUA performed  VITAL SIGNS: Temp:  [97.4 F (36.3 C)-99.8 F (37.7 C)] 97.4 F (36.3 C) (02/12 1138) Pulse Rate:  [48-139] 136 (02/12 1300) Resp:  [0-181] 19 (02/12 1300) BP: (76-158)/(28-74) 78/54 mmHg (02/12 1300) SpO2:  [95 %-100 %] 97 % (02/12 1300) FiO2 (%):  [40 %-50 %] 40 % (02/12 1300) Weight:  [107 kg (235 lb 14.3 oz)] 107 kg (235 lb 14.3 oz) (02/12 0500) HEMODYNAMICS: CVP:  [5 mmHg-16 mmHg] 9 mmHg VENTILATOR SETTINGS: Vent Mode:  [-] PRVC FiO2 (%):  [40 %-50 %] 40 % Set Rate:  [14 bmp-18 bmp] 18 bmp Vt Set:  [620 mL] 620 mL PEEP:  [5 cmH20] 5  cmH20 Plateau Pressure:  [14 cmH20-17 cmH20] 14 cmH20 INTAKE / OUTPUT:  Intake/Output Summary (Last 24 hours) at 12/17/14 1324 Last data filed at 12/17/14 1300  Gross per 24 hour  Intake 8024.47 ml  Output   4950 ml  Net 3074.47 ml    PHYSICAL EXAMINATION: General:  RASS -2, not F/C  Neuro:  No focal deficits HEENT:  WNL Cardiovascular: IRIR, no M noted Lungs: slightly coarse throughout Abdomen:  Soft, absent BS, wound vac Ext: warm, no edema  LABS:  CBC  Recent Labs Lab 01/02/2015 2120 12/17/14 0440  WBC 9.3 15.3*  HGB 10.5* 11.0*  HCT 31.2* 32.4*  PLT 85* 101*   Coag's  Recent Labs Lab 12/06/2014 1325  INR 1.53*   BMET  Recent Labs Lab 12/09/2014 1325 12/27/2014 2120 12/17/14 0440  NA 139 134* 133*  K 4.4 4.5 4.8  CL 104 102 106  CO2 18* 18* 19  BUN 29* 34* 36*  CREATININE 1.58* 1.75* 1.48*  GLUCOSE 111* 163* 143*   Electrolytes  Recent Labs Lab 01/02/2015 1325 12/13/2014 2120 12/17/14 0440  CALCIUM 8.7 8.2* 7.7*   Sepsis Markers  Recent Labs Lab 12/17/2014 1325 12/11/2014 1335 12/28/2014 2145 12/17/14 0440  LATICACIDVEN  --  7.2* 6.4* 3.7*  PROCALCITON 4.90  --   --   --    ABG  Recent Labs Lab 12/11/2014 1917  PHART 7.196*  PCO2ART 41.5  PO2ART 79.0*   Liver Enzymes  Recent Labs Lab 12/12/2014 1325  AST 74*  ALT 26  ALKPHOS 110  BILITOT 2.9*  ALBUMIN 2.6*   Cardiac Enzymes No results for input(s): TROPONINI, PROBNP in the last 168 hours. Glucose  Recent Labs Lab 12/21/2014 1522 12/06/2014 1906 01/01/2015 2337 12/17/14 0333 12/17/14 0710 12/17/14 1131  GLUCAP 102* 142* 139* 136* 132* 141*    CXR: bibasilar atelectasis   ASSESSMENT / PLAN:  PULMONARY  A: VDRF P:   Cont full vent support - settings reviewed and/or adjusted Cont vent bundle Daily SBT if/when meets criteria Leave intubated today for planned OR tomorrow  CARDIOVASCULAR A:  Shock - likely hypovolemic and septic PAF with RVR AAA - nondissecting P:   CVP goal 10-14. NS boluses ordered MAP goal < 65 mmHg.  Transition from norepi to phenylephrine Low dose metoprolol to maintain HR < 115/min Consider amiodarone if unable to get HR under control  RENAL A:   AKI, nonoliguric - Cr improving Mild hyponatremia Resolving lactic acidosis P:   Monitor BMET intermittently Monitor I/Os Correct electrolytes as indicated  GASTROINTESTINAL A:   Post laparotomy with bowel resection Post op ileus H/O cirrhosis documented - details unknown Elevated LFTs P:   SUP: IV pantoprazole Timing and route of nutrition per CCS  TPN ordered 2/12 Planned return to OR 2/13  HEMATOLOGIC A:   Mild anemia without acute blood loss Mild thrombocytopenia P:  DVT px: SCDs Monitor CBC intermittently Transfuse per usual ICU guidelines  INFECTIOUS A:   Severe sepsis - abdominal source Elevated PCT (4.90 2/11) P:   Monitor temp, WBC count Micro and abx as above  ENDOCRINE A:   Stress induced hyperglycemia P:   Cont SSI Might need to adjust scale once TPN initiated  NEUROLOGIC A:   ICU associated discomfort Ppost op pain P:   RASS goal: -1, -2 Cont propofol Cont PRN fentanyl   FAMILY  - Updates: Wife updated @ bedside 2/12  40 mins CCM time    Billy Fischeravid Azekiel Cremer, MD ; [facility] service Mobile [phone].  After 5:30 PM or weekends, call [phone]  Pulmonary and Critical Care Medicine [facility] Pager: [phone]  12/17/2014, 1:32 PM

## 2014-12-17 NOTE — Progress Notes (Signed)
UR Completed.  [phone]  

## 2014-12-17 NOTE — Progress Notes (Addendum)
Wasted 43ml propofol, flushed down sink, witnessed by second [employer] Nicolae Vasek Z.  I witnessed the waste - 43ml propofol Pryor MontesNicole Sharisse Rantz,RN

## 2014-12-17 NOTE — Progress Notes (Addendum)
Spoke with Dr. Vassie LollAlva regarding CVP 8. Updated on pressor requirements. Order received for albumin. Will continue to closely monitor. Thresa RossA. Annaleigh Steinmeyer RN

## 2014-12-17 NOTE — Progress Notes (Signed)
CRITICAL VALUE ALERT  Critical value received:Lactic acid 3.7  Date of notification:  12/17/2014  Time of notification:  0601  Critical value read back:Yes.    Nurse who received alert:  Gennie AlmaHeather Bazil Dhanani, RN  MD notified (1st page):  [place]  Time of first page:  [phone]

## 2014-12-17 NOTE — Telephone Encounter (Signed)
No answer, no vm- mailed letter, dpm °

## 2014-12-17 NOTE — Progress Notes (Signed)
PARENTERAL NUTRITION CONSULT NOTE - INITIAL  Pharmacy Consult for TPN Indication: Prolonged Ileus  Allergies  Allergen Reactions  . Oxycodone Hives and Other (See Comments)    Hallucinations    Patient Measurements: Height: 6\' 1"  (185.4 cm) Weight: 235 lb 14.3 oz (107 kg) IBW/kg (Calculated) : 79.9   Vital Signs: Temp: 97.4 F (36.3 C) (02/12 1138) Temp Source: Axillary (02/12 1138) BP: 78/54 mmHg (02/12 1300) Pulse Rate: 136 (02/12 1300) Intake/Output from previous day: 02/11 0701 - 02/12 0700 In: 5948.8 [I.V.:3066.8; Blood:232; IV Piggyback:2650] Out: 3580 [Urine:1280; Emesis/NG output:600; Drains:1550; Blood:150] Intake/Output from this shift: Total I/O In: 2075.7 [I.V.:995.7; NG/GT:30; IV Piggyback:1050] Out: 1370 [Urine:370; Drains:1000]  Labs:  Recent Labs  12/07/2014 1325 12/31/2014 2120 12/17/14 0440  WBC  --  9.3 15.3*  HGB  --  10.5* 11.0*  HCT  --  31.2* 32.4*  PLT  --  85* 101*  INR 1.53*  --   --      Recent Labs  12/15/2014 1057 12/26/2014 1325 12/31/2014 2120 12/17/14 0440  NA  --  139 134* 133*  K  --  4.4 4.5 4.8  CL  --  104 102 106  CO2  --  18* 18* 19  GLUCOSE  --  111* 163* 143*  BUN  --  29* 34* 36*  CREATININE  --  1.58* 1.75* 1.48*  CALCIUM  --  8.7 8.2* 7.7*  PROT  --  5.5*  --   --   ALBUMIN  --  2.6*  --   --   AST  --  74*  --   --   ALT  --  26  --   --   ALKPHOS  --  110  --   --   BILITOT  --  2.9*  --   --   TRIG 48  --   --   --    Estimated Creatinine Clearance: 64.7 mL/min (by C-G formula based on Cr of 1.48).    Recent Labs  12/17/14 0333 12/17/14 0710 12/17/14 1131  GLUCAP 136* 132* 141*    Medical History: Past Medical History  Diagnosis Date  . Cirrhosis   . CAD (coronary artery disease)   . Hypertension   . Pancreatitis   . Depression   . AAA (abdominal aortic aneurysm) without rupture     Medications:  Scheduled:  . sodium chloride   Intravenous Once  . chlorhexidine  15 mL Mouth Rinse BID  .  insulin aspart  0-9 Units Subcutaneous 6 times per day  . pantoprazole (PROTONIX) IV  40 mg Intravenous Q24H  . piperacillin-tazobactam (ZOSYN)  IV  3.375 g Intravenous 3 times per day   . Insulin Requirements in the past 24 hours:  5 units SSI  Current Nutrition:  NPO  Admit:  64 year old man transferred from outside hospital for concern of ischemic bowel.  He underwent surgery on 2/11 - exploratory laparotomy, small bowel resection and application of a wound vac.  TPN to start in anticipation of prolonged bowel rest/ileus.    Nutritional Goals:  2200 kCal, 120 grams of protein per day   GI: S/p ex. Lap with SBR on 2/11.  Also history of cirrhosis child pugh B.  Lactulose, PPI, Rifaximin, spironolactione, Ursodiol  PTA.  Receiving propofol at 12.237mL/hr  Endo: No history of diabetes.  CBG < 150 so far  Lytes: K 4.8, Na 133  Renal: Scr 1.48 today.  CrCl 65 mL/min  Pulm:  Intubated post op - 40% FiO2  Cards: History of CAD on simvastatin and metoprolol pta.  Hypotensive and on pressors now.  Hepatobil: history of cirrhosis.  AST 74, ALT 26, Tbili 2.9, INR 1.53  Neuro: Zoloft PTA for depression  ID:   Zosyn for intra-abdominal coverage (2/11>>)  Best Practices: SCD on  TPN Access: Triple Lumen IJ (placed 2/11)  TPN day#: 1 (12/17/14>>)   Plan:  Start Clinimix E 5/15 at 77mL/hr with MVI and Trace elements.  This will provide 48g protein and 682 Kcal. Will not start lipids at this time as he is already on propofol at 12.50mL/hr. Stop IV fluids when TPN starts this evening per MD request. TPN labs in AM Continue SSI Advance TPN to goal as tolerated Follow up RD assessment and nutritional goals  Mickeal Skinner 12/17/2014,1:23 PM

## 2014-12-17 NOTE — Telephone Encounter (Signed)
-----   Message from Sharee PimpleMarilyn K McChesney, RN sent at 01/02/2015  2:23 PM EST ----- Regarding: Schedule   ----- Message -----    From: Nada LibmanVance W Brabham, MD    Sent: 12/10/2014   1:25 PM      To: [facility] Charge Pool  Needs follow-up in 6 months with an abdominal ultrasound for aneurysm

## 2014-12-18 ENCOUNTER — Inpatient Hospital Stay (HOSPITAL_COMMUNITY): Payer: BLUE CROSS/BLUE SHIELD

## 2014-12-18 ENCOUNTER — Encounter (HOSPITAL_COMMUNITY): Admission: EM | Disposition: E | Payer: Self-pay | Source: Other Acute Inpatient Hospital | Attending: Pulmonary Disease

## 2014-12-18 ENCOUNTER — Inpatient Hospital Stay (HOSPITAL_COMMUNITY): Payer: BLUE CROSS/BLUE SHIELD | Admitting: Certified Registered Nurse Anesthetist

## 2014-12-18 HISTORY — PX: LAPAROTOMY: SHX154

## 2014-12-18 LAB — CBC
HCT: 26.2 % — ABNORMAL LOW (ref 39.0–52.0)
HEMATOCRIT: 30.1 % — AB (ref 39.0–52.0)
HEMOGLOBIN: 10.2 g/dL — AB (ref 13.0–17.0)
Hemoglobin: 8.8 g/dL — ABNORMAL LOW (ref 13.0–17.0)
MCH: 35.9 pg — ABNORMAL HIGH (ref 26.0–34.0)
MCH: 34.7 pg — ABNORMAL HIGH (ref 26.0–34.0)
MCHC: 33.6 g/dL (ref 30.0–36.0)
MCHC: 33.9 g/dL (ref 30.0–36.0)
MCV: 106.9 fL — AB (ref 78.0–100.0)
MCV: 102.4 fL — AB (ref 78.0–100.0)
PLATELETS: 80 10*3/uL — AB (ref 150–400)
Platelets: 74 10*3/uL — ABNORMAL LOW (ref 150–400)
RBC: 2.45 MIL/uL — ABNORMAL LOW (ref 4.22–5.81)
RBC: 2.94 MIL/uL — ABNORMAL LOW (ref 4.22–5.81)
RDW: 15.9 % — ABNORMAL HIGH (ref 11.5–15.5)
RDW: 18.6 % — AB (ref 11.5–15.5)
WBC: 8.6 10*3/uL (ref 4.0–10.5)
WBC: 5.5 10*3/uL (ref 4.0–10.5)

## 2014-12-18 LAB — DIFFERENTIAL
Basophils Absolute: 0 10*3/uL (ref 0.0–0.1)
Basophils Relative: 0 % (ref 0–1)
EOS PCT: 2 % (ref 0–5)
Eosinophils Absolute: 0.2 10*3/uL (ref 0.0–0.7)
LYMPHS PCT: 17 % (ref 12–46)
Lymphs Abs: 1.4 10*3/uL (ref 0.7–4.0)
MONO ABS: 1.4 10*3/uL — AB (ref 0.1–1.0)
MONOS PCT: 16 % — AB (ref 3–12)
NEUTROS ABS: 5.6 10*3/uL (ref 1.7–7.7)
Neutrophils Relative %: 64 % (ref 43–77)

## 2014-12-18 LAB — PREPARE RBC (CROSSMATCH)

## 2014-12-18 LAB — COMPREHENSIVE METABOLIC PANEL
ALBUMIN: 2.5 g/dL — AB (ref 3.5–5.2)
ALT: 21 U/L (ref 0–53)
AST: 52 U/L — ABNORMAL HIGH (ref 0–37)
Alkaline Phosphatase: 39 U/L (ref 39–117)
Anion gap: 3 — ABNORMAL LOW (ref 5–15)
BUN: 30 mg/dL — AB (ref 6–23)
CALCIUM: 7.5 mg/dL — AB (ref 8.4–10.5)
CO2: 26 mmol/L (ref 19–32)
Chloride: 105 mmol/L (ref 96–112)
Creatinine, Ser: 1.06 mg/dL (ref 0.50–1.35)
GFR calc Af Amer: 84 mL/min — ABNORMAL LOW (ref >90)
GFR calc non Af Amer: 72 mL/min — ABNORMAL LOW (ref >90)
GLUCOSE: 127 mg/dL — AB (ref 70–99)
POTASSIUM: 4.3 mmol/L (ref 3.5–5.1)
Sodium: 134 mmol/L — ABNORMAL LOW (ref 135–145)
Total Bilirubin: 1.8 mg/dL — ABNORMAL HIGH (ref 0.3–1.2)
Total Protein: 4.2 g/dL — ABNORMAL LOW (ref 6.0–8.3)

## 2014-12-18 LAB — GLUCOSE, CAPILLARY
Glucose-Capillary: 119 mg/dL — ABNORMAL HIGH (ref 70–99)
Glucose-Capillary: 123 mg/dL — ABNORMAL HIGH (ref 70–99)
Glucose-Capillary: 137 mg/dL — ABNORMAL HIGH (ref 70–99)
Glucose-Capillary: 113 mg/dL — ABNORMAL HIGH (ref 70–99)
Glucose-Capillary: 126 mg/dL — ABNORMAL HIGH (ref 70–99)
Glucose-Capillary: 150 mg/dL — ABNORMAL HIGH (ref 70–99)

## 2014-12-18 LAB — TROPONIN I: Troponin I: <0.03 ng/mL (ref <0.031)

## 2014-12-18 LAB — PROTIME-INR
INR: 2.02 — ABNORMAL HIGH (ref 0.00–1.49)
INR: 1.67 — ABNORMAL HIGH (ref 0.00–1.49)
PROTHROMBIN TIME: 19.9 s — AB (ref 11.6–15.2)
Prothrombin Time: 23 seconds — ABNORMAL HIGH (ref 11.6–15.2)

## 2014-12-18 LAB — LACTIC ACID, PLASMA
LACTIC ACID, VENOUS: 1.3 mmol/L (ref 0.5–2.0)
Lactic Acid, Venous: 1.4 mmol/L (ref 0.5–2.0)

## 2014-12-18 LAB — TRIGLYCERIDES: TRIGLYCERIDES: 30 mg/dL (ref <150)

## 2014-12-18 LAB — MAGNESIUM: MAGNESIUM: 1.7 mg/dL (ref 1.5–2.5)

## 2014-12-18 LAB — PHOSPHORUS: PHOSPHORUS: 2.4 mg/dL (ref 2.3–4.6)

## 2014-12-18 SURGERY — LAPAROTOMY, EXPLORATORY
Anesthesia: General | Site: Abdomen

## 2014-12-18 MED ORDER — MIDAZOLAM HCL 2 MG/2ML IJ SOLN
INTRAMUSCULAR | Status: AC
  Filled 2014-12-18: qty 2

## 2014-12-18 MED ORDER — MAGNESIUM SULFATE 2 GM/50ML IV SOLN
2.0000 g | Freq: Once | INTRAVENOUS | Status: AC
  Administered 2014-12-18: 2 g via INTRAVENOUS
  Filled 2014-12-18: qty 50

## 2014-12-18 MED ORDER — SODIUM CHLORIDE 0.9 % IV SOLN: 10.0000 mL/h | Freq: Once | INTRAVENOUS | Status: DC

## 2014-12-18 MED ORDER — FENTANYL BOLUS VIA INFUSION
INTRAVENOUS | Status: DC | PRN
  Administered 2014-12-18: 100 ug via INTRAVENOUS
  Administered 2014-12-18: 150 ug via INTRAVENOUS
  Administered 2014-12-18: 100 ug via INTRAVENOUS

## 2014-12-18 MED ORDER — VECURONIUM BROMIDE 10 MG IV SOLR
INTRAVENOUS | Status: AC
  Filled 2014-12-18: qty 10

## 2014-12-18 MED ORDER — SODIUM CHLORIDE 0.9 % IJ SOLN
INTRAMUSCULAR | Status: AC
  Filled 2014-12-18: qty 10

## 2014-12-18 MED ORDER — MIDAZOLAM HCL 2 MG/2ML IJ SOLN
INTRAMUSCULAR | Status: DC | PRN
  Administered 2014-12-18: 4 mg via INTRAVENOUS
  Administered 2014-12-18: 2 mg via INTRAVENOUS

## 2014-12-18 MED ORDER — VECURONIUM BROMIDE 10 MG IV SOLR
INTRAVENOUS | Status: DC | PRN
  Administered 2014-12-18 (×2): 2 mg via INTRAVENOUS

## 2014-12-18 MED ORDER — NOREPINEPHRINE BITARTRATE 1 MG/ML IV SOLN
2.0000 ug/min | INTRAVENOUS | Status: DC
  Administered 2014-12-18: 2 ug/min via INTRAVENOUS
  Filled 2014-12-18 (×2): qty 4

## 2014-12-18 MED ORDER — 0.9 % SODIUM CHLORIDE (POUR BTL) OPTIME
TOPICAL | Status: DC | PRN
  Administered 2014-12-18 (×2): 1000 mL

## 2014-12-18 MED ORDER — LEVALBUTEROL HCL 0.63 MG/3ML IN NEBU
0.6300 mg | INHALATION_SOLUTION | RESPIRATORY_TRACT | Status: DC | PRN
  Administered 2014-12-25: 0.63 mg via RESPIRATORY_TRACT
  Filled 2014-12-18: qty 3

## 2014-12-18 MED ORDER — TRACE MINERALS CR-CU-F-FE-I-MN-MO-SE-ZN IV SOLN
INTRAVENOUS | Status: AC
  Administered 2014-12-18: 18:00:00 via INTRAVENOUS
  Filled 2014-12-18: qty 1992

## 2014-12-18 MED ORDER — SODIUM CHLORIDE 0.9 % IV SOLN
INTRAVENOUS | Status: DC | PRN
  Administered 2014-12-18: 09:00:00 via INTRAVENOUS

## 2014-12-18 MED ORDER — FAT EMULSION 20 % IV EMUL
250.0000 mL | INTRAVENOUS | Status: AC
  Administered 2014-12-18: 250 mL via INTRAVENOUS
  Filled 2014-12-18: qty 250

## 2014-12-18 MED ORDER — METOPROLOL TARTRATE 1 MG/ML IV SOLN
2.5000 mg | Freq: Four times a day (QID) | INTRAVENOUS | Status: DC
  Administered 2014-12-18 – 2014-12-23 (×16): 2.5 mg via INTRAVENOUS
  Filled 2014-12-18 (×22): qty 5

## 2014-12-18 MED ORDER — AMIODARONE IV BOLUS ONLY 150 MG/100ML
150.0000 mg | Freq: Once | INTRAVENOUS | Status: AC
  Administered 2014-12-18: 150 mg via INTRAVENOUS

## 2014-12-18 MED ORDER — ALBUMIN HUMAN 5 % IV SOLN
INTRAVENOUS | Status: DC | PRN
  Administered 2014-12-18: 08:00:00 via INTRAVENOUS

## 2014-12-18 MED ORDER — PNEUMOCOCCAL VAC POLYVALENT 25 MCG/0.5ML IJ INJ
0.5000 mL | INJECTION | Freq: Once | INTRAMUSCULAR | Status: AC | PRN
  Filled 2014-12-18: qty 0.5

## 2014-12-18 SURGICAL SUPPLY — 49 items
BLADE SURG ROTATE 9660 (MISCELLANEOUS) IMPLANT
CANISTER SUCTION 2500CC (MISCELLANEOUS) ×3 IMPLANT
CANISTER WOUND CARE 500ML ATS (WOUND CARE) ×3 IMPLANT
CHLORAPREP W/TINT 26ML (MISCELLANEOUS) ×3 IMPLANT
COVER MAYO STAND STRL (DRAPES) IMPLANT
COVER SURGICAL LIGHT HANDLE (MISCELLANEOUS) ×3 IMPLANT
DRAPE LAPAROSCOPIC ABDOMINAL (DRAPES) ×3 IMPLANT
DRAPE PROXIMA HALF (DRAPES) IMPLANT
DRAPE UTILITY XL STRL (DRAPES) ×6 IMPLANT
DRAPE WARM FLUID 44X44 (DRAPE) ×3 IMPLANT
DRSG OPSITE POSTOP 4X10 (GAUZE/BANDAGES/DRESSINGS) IMPLANT
DRSG OPSITE POSTOP 4X8 (GAUZE/BANDAGES/DRESSINGS) IMPLANT
DRSG VAC ATS LRG SENSATRAC (GAUZE/BANDAGES/DRESSINGS) ×3 IMPLANT
ELECT BLADE 6.5 EXT (BLADE) IMPLANT
ELECT CAUTERY BLADE 6.4 (BLADE) ×3 IMPLANT
ELECT REM PT RETURN 9FT ADLT (ELECTROSURGICAL) ×3
ELECTRODE REM PT RTRN 9FT ADLT (ELECTROSURGICAL) ×1 IMPLANT
GLOVE BIO SURGEON STRL SZ8 (GLOVE) ×3 IMPLANT
GLOVE BIOGEL PI IND STRL 8 (GLOVE) ×1 IMPLANT
GLOVE BIOGEL PI INDICATOR 8 (GLOVE) ×2
GOWN STRL REUS W/ TWL LRG LVL3 (GOWN DISPOSABLE) ×2 IMPLANT
GOWN STRL REUS W/ TWL XL LVL3 (GOWN DISPOSABLE) ×1 IMPLANT
GOWN STRL REUS W/TWL LRG LVL3 (GOWN DISPOSABLE) ×4
GOWN STRL REUS W/TWL XL LVL3 (GOWN DISPOSABLE) ×2
KIT BASIN OR (CUSTOM PROCEDURE TRAY) ×3 IMPLANT
KIT ROOM TURNOVER OR (KITS) ×3 IMPLANT
LIGASURE IMPACT 36 18CM CVD LR (INSTRUMENTS) IMPLANT
NS IRRIG 1000ML POUR BTL (IV SOLUTION) ×6 IMPLANT
PACK GENERAL/GYN (CUSTOM PROCEDURE TRAY) ×3 IMPLANT
PAD ARMBOARD 7.5X6 YLW CONV (MISCELLANEOUS) ×3 IMPLANT
PENCIL BUTTON HOLSTER BLD 10FT (ELECTRODE) IMPLANT
SPECIMEN JAR LARGE (MISCELLANEOUS) IMPLANT
SPONGE LAP 18X18 X RAY DECT (DISPOSABLE) IMPLANT
STAPLER GUN LINEAR PROX 60 (STAPLE) ×3 IMPLANT
STAPLER PROXIMATE 75MM BLUE (STAPLE) ×3 IMPLANT
STAPLER VISISTAT 35W (STAPLE) ×3 IMPLANT
SUCTION POOLE TIP (SUCTIONS) ×3 IMPLANT
SUT NOVA 1 T20/GS 25DT (SUTURE) ×15 IMPLANT
SUT PDS AB 1 TP1 96 (SUTURE) ×6 IMPLANT
SUT VIC AB 2-0 SH 18 (SUTURE) ×3 IMPLANT
SUT VIC AB 3-0 SH 18 (SUTURE) ×15 IMPLANT
SUT VICRYL AB 2 0 TIES (SUTURE) ×3 IMPLANT
SUT VICRYL AB 3 0 TIES (SUTURE) ×3 IMPLANT
TOWEL OR 17X24 6PK STRL BLUE (TOWEL DISPOSABLE) ×3 IMPLANT
TOWEL OR 17X26 10 PK STRL BLUE (TOWEL DISPOSABLE) ×3 IMPLANT
TRAY FOLEY CATH 16FRSI W/METER (SET/KITS/TRAYS/PACK) IMPLANT
TUBE CONNECTING 12'X1/4 (SUCTIONS)
TUBE CONNECTING 12X1/4 (SUCTIONS) IMPLANT
YANKAUER SUCT BULB TIP NO VENT (SUCTIONS) IMPLANT

## 2014-12-18 NOTE — Anesthesia Preprocedure Evaluation (Addendum)
Anesthesia Evaluation  Patient identified by MRN, date of birth, ID band Patient awake    Reviewed: Allergy & Precautions, NPO status , Patient's Chart, lab work & pertinent test results  Airway Mallampati: II   Neck ROM: full   Comment: Intubated Dental  (+) Dental Advisory Given   Pulmonary former smoker,  breath sounds clear to auscultation        Cardiovascular hypertension, + CAD, + CABG and + Peripheral Vascular Disease Rhythm:regular Rate:Normal  TTE 6/15- Normal LV function. Mild RV systolic dysfunction, Trivial valvular disease.   Neuro/Psych Depression negative neurological ROS     GI/Hepatic Elevated AST Ischemic bowel pancreatitis   Endo/Other  negative endocrine ROS  Renal/GU ARFRenal disease     Musculoskeletal   Abdominal   Peds  Hematology  (+) anemia , Thrombocytopenic- plts 80 INR 2.02   Anesthesia Other Findings   Reproductive/Obstetrics                            Anesthesia Physical  Anesthesia Plan  ASA: IV  Anesthesia Plan: General   Post-op Pain Management:    Induction: Intravenous  Airway Management Planned: Oral ETT  Additional Equipment:   Intra-op Plan:   Post-operative Plan: Possible Post-op intubation/ventilation  Informed Consent: I have reviewed the patients History and Physical, chart, labs and discussed the procedure including the risks, benefits and alternatives for the proposed anesthesia with the patient or authorized representative who has indicated his/her understanding and acceptance.   Dental advisory given  Plan Discussed with: CRNA  Anesthesia Plan Comments:        Anesthesia Quick Evaluation

## 2014-12-18 NOTE — Anesthesia Postprocedure Evaluation (Signed)
  Anesthesia Post-op Note  Patient: Kyle West  Procedure(s) Performed: Procedure(s): EXPLORATORY LAPAROTOMY (N/A)  Patient Location: ICU  Anesthesia Type:General  Level of Consciousness: sedated, unresponsive and Patient remains intubated per anesthesia plan  Airway and Oxygen Therapy: Patient remains intubated per anesthesia plan  Post-op Pain: not able to evaluate due to sedation  Post-op Assessment: Post-op Vital signs reviewed, Patient's Cardiovascular Status Stable and Respiratory Function Stable  Post-op Vital Signs: Reviewed and stable  Last Vitals:  Filed Vitals:   12/30/2014 0745  BP: 115/66  Pulse: 136  Temp:   Resp: 15    Complications: No apparent anesthesia complications

## 2014-12-18 NOTE — Progress Notes (Signed)
eLink Physician-Brief Progress Note Patient Name: Kyle West DOB: 04/30/1951 MRN: [id number]   Date of Service  12/11/2014  HPI/Events of Note  Falling Bp  eICU Interventions  Add levophed     Intervention Category Major Interventions: Shock - evaluation and management  [name] 12/07/2014, 9:10 PM

## 2014-12-18 NOTE — Op Note (Signed)
Preoperative diagnosis: Open abdomen with ischemia of small intestine  Postoperative diagnosis: Same  Procedure: Exploratory laparotomy with creation of a side-to-side small bowel anastomosis and closure of abdomen  Surgeon: Harriette Bouillonhomas Kessler Kopinski M.D.  Anesthesia: Gen.  Blood loss: Minimal  IV fluids: 2 units of FFP 2 units of platelets and 1 unit packed RBC  Drains: None  Indications for procedure: Patient returns to the operating room for second-look operation secondary to ischemia involving his ileum and small bowel resection 2 days ago. He has remained critically ill on the ventilator but his metabolic acidosis has improved. He returns for possible anastomosis versus ostomy and closure today. Procedure discussed with family. Remains critically ill but stable at this point an appropriate return to the operating room. He is opening his eyes morning and interacting on the ventilator. Blood pressure has been stable on inotropes are being weaned.  Description of procedure: Patient brought directly back to the operating room. He was already on a ventilator. Anesthesia placed an arterial line. Gen. anesthesia initiated. Timeout done to verify proper patient and procedure. Abdomen prepped and draped in a sterile fashion and wound VAC removed. Retractors placed. Small bowel examined and viable with no signs of ischemia the ligament of Treitz to the staple line in the distal jejunum. 15 cm of terminal ileum remained and this is viable with no signs of ischemia. A sending, transverse, descending, and sigmoid colons all appeared normal. Rectum normal. Liver severe cirrhosis. Stomach normal. Given the findings on examination today and his improvement overall, I elected to restore his gastrointestinal continuity. A side-to-side functional end anastomosis was created using a GIA-75 stapling device and a TA 60 to close the common enterotomy. Staple line reinforced with 3-0 Vicryl.  Stitch placed in the crotch of the  anastomosis. Anastomosis had no tension was widely patent. Mesenteric defect closed with 2  0 with 3-0 Vicryl. Abdominal cavity irrigated this was clear. Fascia closed with interrupted #1 Novafil sutures. Wound VAC placed in subcutaneous fat. All final counts the sponge, needles and attachments found to be correct at this portion of the case. Patient was returned to the ICU in critical but stable condition.

## 2014-12-18 NOTE — Progress Notes (Signed)
PARENTERAL NUTRITION CONSULT NOTE - INITIAL  Pharmacy Consult for TPN Indication: Prolonged Ileus  Patient Measurements: Height: 6\' 1"  (185.4 cm) Weight: 233 lb 11 oz (106 kg) IBW/kg (Calculated) : 79.9   Vital Signs: Temp: 98.7 F (37.1 C) (02/13 0400) Temp Source: Axillary (02/13 0400) BP: 104/57 mmHg (02/13 0645) Pulse Rate: 135 (02/13 0645) Intake/Output from previous day: 02/12 0701 - 02/13 0700 In: 6569.8 [I.V.:3071.8; NG/GT:90; IV Piggyback:2900; TPN:508] Out: 6265 [Urine:2215; Emesis/NG output:400; Drains:3650] Intake/Output from this shift: Total I/O In: 2342 [I.V.:1272; NG/GT:30; IV Piggyback:600; TPN:440] Out: 2885 [Urine:1435; Emesis/NG output:50; Drains:1400]  Labs:  Recent Labs  01/01/2015 1325 12/22/2014 2120 12/17/14 0440 12/10/2014 0405  WBC  --  9.3 15.3*  --   HGB  --  10.5* 11.0*  --   HCT  --  31.2* 32.4*  --   PLT  --  85* 101*  --   INR 1.53*  --   --  2.02*     Recent Labs  12/24/2014 1057 12/31/2014 1325  12/17/14 0440 12/17/14 2222 12/10/2014 0405  NA  --  139  < > 133* 132* 134*  K  --  4.4  < > 4.8 4.4 4.3  CL  --  104  < > 106 105 105  CO2  --  18*  < > 19 24 26   GLUCOSE  --  111*  < > 143* 122* 127*  BUN  --  29*  < > 36* 34* 30*  CREATININE  --  1.58*  < > 1.48* 1.25 1.06  CALCIUM  --  8.7  < > 7.7* 7.3* 7.5*  MG  --   --   --   --   --  1.7  PHOS  --   --   --   --   --  2.4  PROT  --  5.5*  --   --   --  4.2*  ALBUMIN  --  2.6*  --   --   --  2.5*  AST  --  74*  --   --   --  52*  ALT  --  26  --   --   --  21  ALKPHOS  --  110  --   --   --  39  BILITOT  --  2.9*  --   --   --  1.8*  TRIG 48  --   --   --   --  30  < > = values in this interval not displayed. Estimated Creatinine Clearance: 89.9 mL/min (by C-G formula based on Cr of 1.06).    Recent Labs  12/17/14 2032 12/17/14 2310 12/06/2014 0427  GLUCAP 133* 119* 123*    Medications:  Scheduled:  . sodium chloride   Intravenous Once  . albumin human      .  chlorhexidine  15 mL Mouth Rinse BID  . insulin aspart  0-9 Units Subcutaneous 6 times per day  . pantoprazole (PROTONIX) IV  40 mg Intravenous Q24H  . piperacillin-tazobactam (ZOSYN)  IV  3.375 g Intravenous 3 times per day   . Insulin Requirements in the past 24 hours:  3 units SSI  Current Nutrition:  NPO  Admit:  64 year old man transferred from outside hospital for concern of ischemic bowel.  He underwent surgery on 2/11 - exploratory laparotomy, small bowel resection and application of a wound vac.  TPN to start in anticipation of prolonged bowel rest/ileus.  Nutritional Goals: (per RD note on 2/12 - Thanks!)  1515-1840 kcal, >= 167g protein while on vent  2200-2400 kcal, 120-135g  of protein per day when off vent  GI: S/p ex. Lap with SBR on 2/11.  Also history of pancreatitis and cirrhosis child pugh B.  Lactulose, PPI, Rifaximin, spironolactione, Ursodiol  PTA.  Propofol discontinue 12/17/14.  Receiving IV PPI.  Back to OR today (2/13)  Endo: No history of diabetes.  CBG < 150 so far  Lytes: K 4.3, Na 134, Mg 1.7, Phos 2.4, Ca 7.5 (corrected 8.7)  Renal: Scr 1.06 today.  CrCl 90 mL/min.  UOP 0.9 mL/kg/hr  Pulm: Intubated post op - 40% FiO2  Cards: History of CAD on simvastatin and metoprolol pta.  Hypotensive and on pressors now.  Hepatobil: history of cirrhosis.  AST 52, ALT 21, Tbili 1.8, INR 2.02  Neuro: Zoloft PTA for depression  ID:   Zosyn for intra-abdominal coverage (2/11>>).  Afebrile.    Best Practices: SCD on  TPN Access: Triple Lumen IJ (placed 2/11)  TPN day#: 2 (12/17/14>>)   Plan:  Increase Clinimix E 5/15 to 73mL/hr with MVI and Trace elements and lipids at 45mL/hr.  This will provide 100g protein and 1914 Kcal. Will continue to increase to goal as tolerated. Magnesium 2g IV x 1 dose BMET, Mg, Phos in AM Continue SSI  Kyle West 12/24/2014,6:53 AM

## 2014-12-18 NOTE — Transfer of Care (Signed)
Immediate Anesthesia Transfer of Care Note  Patient: Kyle West  Procedure(s) Performed: Procedure(s): EXPLORATORY LAPAROTOMY (N/A)  Patient Location: ICU  Anesthesia Type:General  Level of Consciousness: sedated, unresponsive and Patient remains intubated per anesthesia plan  Airway & Oxygen Therapy: Patient remains intubated per anesthesia plan and Patient placed on Ventilator (see vital sign flow sheet for setting)  Post-op Assessment: Report given to RN and Post -op Vital signs reviewed and stable  Post vital signs: Reviewed and stable  Last Vitals:  Filed Vitals:   2015/06/09 0745  BP: 115/66  Pulse: 136  Temp:   Resp: 15    Complications: No apparent anesthesia complications

## 2014-12-18 NOTE — Progress Notes (Signed)
PULMONARY / CRITICAL CARE MEDICINE   Name: Kyle West MRN: [id number] DOB: [birth year]    ADMISSION DATE:  12/10/2014   REFERRING MD :  OSH  CHIEF COMPLAINT:  abd pain   INITIAL PRESENTATION:  64yo male with hx HTN, CAD s/p CABG, cirrhosis of unclear etiology (followed at [facility]), reported recent hx pancreatitis.  Presented to OSH 2/11 with 2 day hx worsening abd pain, nausea and vomiting.  CT at outside hospital revealed pneumatosis, hepatic and portal venous gas and infrarenal AAA.  He was tx to [facility] for further eval with concern for ischemic bowel. Underwent laparotomy shortly after admission  MAJOR EVENTS/TEST RESULTS: 2/11 CT abd/pelvis: dilation and pneumatosis of mid and distal small bowel with moderate ascites.  Air in smv and mpv and extending into hepatic veins.  No gi obstruction.  Infrarenal AAA.  Cirrhotic changes in liver.   2/11 exploratory laparotomy, small bowel resection, application of wound vac (Dr Luisa Hart). Left intubated post op. Post op hypotension requiring CVL placement, volume resuscitation, vasopressors 2/12 sinus tachycardia, new onset AFRVR. Vasopressors changed from NE to PE. Low dose metoprolol initiated 2/13>> back to OR, side-to-side small bowel anastomosis & closure of fascia.  Persistent AFib with RVR.    SUBJECTIVE:  To OR this am with fascial closure, VAC remains.  AFib with RVR on return from OR with rate 140's.  Mult blood products given in OR r/t anemia.   VITAL SIGNS: Temp:  [97.8 F (36.6 C)-99.4 F (37.4 C)] 97.8 F (36.6 C) (02/13 1100) Pulse Rate:  [111-137] 129 (02/13 1115) Resp:  [0-21] 15 (02/13 1115) BP: (74-160)/(38-81) 106/62 mmHg (02/13 1115) SpO2:  [97 %-100 %] 99 % (02/13 1115) Arterial Line BP: (148)/(50) 148/50 mmHg (02/13 1030) FiO2 (%):  [40 %] 40 % (02/13 1115) Weight:  [233 lb 11 oz (106 kg)] 233 lb 11 oz (106 kg) (02/13 0215) HEMODYNAMICS: CVP:  [8 mmHg-14 mmHg] 14 mmHg VENTILATOR SETTINGS: Vent Mode:  [-]  PRVC FiO2 (%):  [40 %] 40 % Set Rate:  [15 bmp] 15 bmp Vt Set:  [600 mL] 600 mL PEEP:  [5 cmH20] 5 cmH20 Plateau Pressure:  [9 cmH20-17 cmH20] 9 cmH20 INTAKE / OUTPUT:  Intake/Output Summary (Last 24 hours) at 12/17/2014 1220 Last data filed at 12/07/2014 1030  Gross per 24 hour  Intake 8232.26 ml  Output   5290 ml  Net 2942.26 ml    PHYSICAL EXAMINATION: General:  RASS -2, post op, NAD  Neuro:  Sedated post op, no focal deficit  HEENT:  WNL Cardiovascular: IRIR, no M noted Lungs: resps even non labored on vent, slightly coarse throughout Abdomen:  Soft, absent BS, small wound vac midline  Ext: warm, no edema  LABS:  CBC  Recent Labs Lab 12/06/2014 2120 12/17/14 0440 12/23/2014 0405  WBC 9.3 15.3* 8.6  HGB 10.5* 11.0* 8.8*  HCT 31.2* 32.4* 26.2*  PLT 85* 101* 80*   Coag's  Recent Labs Lab 01/01/2015 1325 01/01/2015 0405  INR 1.53* 2.02*   BMET  Recent Labs Lab 12/17/14 0440 12/17/14 2222 12/29/2014 0405  NA 133* 132* 134*  K 4.8 4.4 4.3  CL 106 105 105  CO2 BUN 36* 34* 30*  CREATININE 1.48* 1.25 1.06  GLUCOSE 143* 122* 127*   Electrolytes  Recent Labs Lab 12/17/14 0440 12/17/14 2222 12/30/2014 0405  CALCIUM 7.7* 7.3* 7.5*  MG  --   --  1.7  PHOS  --   --  2.4  Sepsis Markers  Recent Labs Lab 01/02/2015 1325  12/17/14 0440 12/17/14 2219 12/17/2014 0057  LATICACIDVEN  --   < > 3.7* 1.6 1.4  PROCALCITON 4.90  --   --   --   --   < > = values in this interval not displayed. ABG  Recent Labs Lab 12/26/2014 1917 12/17/14 1602  PHART 7.196* 7.357  PCO2ART 41.5 35.9  PO2ART 79.0* 100.0   Liver Enzymes  Recent Labs Lab 12/13/2014 1325 12/17/2014 0405  AST 74* 52*  ALT 26 21  ALKPHOS 110 39  BILITOT 2.9* 1.8*  ALBUMIN 2.6* 2.5*   Cardiac Enzymes No results for input(s): TROPONINI, PROBNP in the last 168 hours. Glucose  Recent Labs Lab 12/17/14 1131 12/17/14 1524 12/17/14 2032 12/17/14 2310 12/27/2014 0427 12/14/2014 0734  GLUCAP  141* 127* 133* 119* 123* 137*     ASSESSMENT / PLAN:  PULMONARY A: VDRF - in setting sepsis, post op exp lap r/t bowel ischemia  P:   Cont full vent support Cont vent bundle Daily SBT  No definite plans further OR   CARDIOVASCULAR A:  ETT 2/11 >>  L IJ CVL 2/11 >>  Shock - likely hypovolemic and septic PAF with RVR -- Persistent RVR 2/13 AAA - nondissecting P:  CVP goal 10-14. NS boluses ordered MAP goal < 65 mmHg  Cont neo gtt and titrate as able  Cont amio gtt and re-bolus  Low dose scheduled metoprolol - ? B blocker withdrawal  Consider cards input if remains refractory - not much BP room to add additional Rx  No anticoagulation for now with exp lap  F/u troponin    RENAL A:   AKI, nonoliguric - Cr improving Mild hyponatremia Resolving lactic acidosis P:   Monitor BMET intermittently Monitor I/Os Correct electrolytes as indicated   GASTROINTESTINAL A:   Post laparotomy with bowel resection, repeat OR for fascial closure 2/13 Post op ileus H/O cirrhosis documented - details unknown Elevated LFTs P:   SUP: IV pantoprazole TPN per surgery  No further plans OR for now    HEMATOLOGIC A:   Mild anemia without acute blood loss Mild thrombocytopenia P:  DVT px: SCDs F/u cbc this pm with hgb trend down  Transfuse per usual ICU guidelines F/u INR    INFECTIOUS A:   Pip-tazo 2/11 >>  Severe sepsis - abdominal source Elevated PCT (4.90 2/11) P:   Monitor temp, WBC count Micro and abx as above   ENDOCRINE A:   Stress induced hyperglycemia P:   Cont SSI    NEUROLOGIC A:   ICU associated discomfort Ppost op pain P:   RASS goal: -1, -2 Fentanyl gtt    FAMILY  - Updates: no family available 2/13   Dirk DressKaty Whiteheart, NP 12/27/2014  12:20 PM Pager: [phone] or [phone]   ATTENDING NOTE: I have personally reviewed patient's available data, including medical history, events of note, physical examination and test results as  part of my evaluation. I have discussed with resident/NP and other careteam providers such as pharmacist, RN and RRT & co-ordinated with consultants. In addition, I personally evaluated patient and elicited key history of small bowel ischemia requiring exploratory laparotomy and mechanical ventilation, exam findings of atrial fibrillation-RVR, sedated, clear lungs & labs showing improving renal function, low albumin, blood loss anemia.  Recommend-defer weaning Need better control of heart rate, okay to use low-dose Lopressor-Neo-Synephrine being tapered Rest per NP/medical resident whose note is outlined above and that I agree with  and edited in full.   The patient is critically ill with multiple organ systems failure and requires high complexity decision making for assessment and support, frequent evaluation and titration of therapies, application of advanced monitoring technologies and extensive interpretation of multiple databases. Critical Care Time devoted to patient care services described in this note independent of APP time is 35 minutes.    ALVA,RAKESH V.

## 2014-12-18 NOTE — H&P (Signed)
Pt for OR for second look and possible closure with anastomosis/ ostomy.  Lactate down.  Discussed with family.

## 2014-12-18 NOTE — Addendum Note (Signed)
Addendum  created 07/17/15 1026 by Kennieth Radobert E Alvira Hecht, MD   Modules edited: Anesthesia Attestations

## 2014-12-18 NOTE — Progress Notes (Signed)
RT note- patient taken off ventilator and taken to the OR with anesthesia CRNA

## 2014-12-19 ENCOUNTER — Inpatient Hospital Stay (HOSPITAL_COMMUNITY): Payer: BLUE CROSS/BLUE SHIELD

## 2014-12-19 LAB — GLUCOSE, CAPILLARY
GLUCOSE-CAPILLARY: 131 mg/dL — AB (ref 70–99)
GLUCOSE-CAPILLARY: 153 mg/dL — AB (ref 70–99)
GLUCOSE-CAPILLARY: 129 mg/dL — AB (ref 70–99)
Glucose-Capillary: 153 mg/dL — ABNORMAL HIGH (ref 70–99)
Glucose-Capillary: 159 mg/dL — ABNORMAL HIGH (ref 70–99)
Glucose-Capillary: 123 mg/dL — ABNORMAL HIGH (ref 70–99)

## 2014-12-19 LAB — PREPARE FRESH FROZEN PLASMA
UNIT DIVISION: 0
UNIT DIVISION: 0

## 2014-12-19 LAB — POCT I-STAT 3, ART BLOOD GAS (G3+)
Acid-base deficit: 5 mmol/L — ABNORMAL HIGH (ref 0.0–2.0)
Bicarbonate: 21.8 mEq/L (ref 20.0–24.0)
O2 Saturation: 93 %
PCO2 ART: 49.2 mmHg — AB (ref 35.0–45.0)
PO2 ART: 80 mmHg (ref 80.0–100.0)
Patient temperature: 99.6
TCO2: 23 mmol/L (ref 0–100)
pH, Arterial: 7.258 — ABNORMAL LOW (ref 7.350–7.450)

## 2014-12-19 LAB — CBC
HCT: 28.3 % — ABNORMAL LOW (ref 39.0–52.0)
HEMOGLOBIN: 9.8 g/dL — AB (ref 13.0–17.0)
MCH: 34.6 pg — ABNORMAL HIGH (ref 26.0–34.0)
MCHC: 34.6 g/dL (ref 30.0–36.0)
MCV: 100 fL (ref 78.0–100.0)
Platelets: 91 10*3/uL — ABNORMAL LOW (ref 150–400)
RBC: 2.83 MIL/uL — ABNORMAL LOW (ref 4.22–5.81)
RDW: 18.8 % — AB (ref 11.5–15.5)
WBC: 11.5 10*3/uL — ABNORMAL HIGH (ref 4.0–10.5)

## 2014-12-19 LAB — PHOSPHORUS: Phosphorus: 2.8 mg/dL (ref 2.3–4.6)

## 2014-12-19 LAB — TROPONIN I: Troponin I: 0.03 ng/mL (ref <0.031)

## 2014-12-19 LAB — PREPARE PLATELET PHERESIS: Unit division: 0

## 2014-12-19 LAB — BASIC METABOLIC PANEL
Anion gap: 3 — ABNORMAL LOW (ref 5–15)
BUN: 37 mg/dL — AB (ref 6–23)
CALCIUM: 7.4 mg/dL — AB (ref 8.4–10.5)
CO2: 22 mmol/L (ref 19–32)
Chloride: 107 mmol/L (ref 96–112)
Creatinine, Ser: 1.34 mg/dL (ref 0.50–1.35)
GFR calc Af Amer: 63 mL/min — ABNORMAL LOW (ref >90)
GFR calc non Af Amer: 54 mL/min — ABNORMAL LOW (ref >90)
Glucose, Bld: 152 mg/dL — ABNORMAL HIGH (ref 70–99)
Potassium: 4.3 mmol/L (ref 3.5–5.1)
Sodium: 132 mmol/L — ABNORMAL LOW (ref 135–145)

## 2014-12-19 LAB — PREALBUMIN: PREALBUMIN: 6.4 mg/dL — AB (ref 17.0–34.0)

## 2014-12-19 LAB — MAGNESIUM: MAGNESIUM: 2.2 mg/dL (ref 1.5–2.5)

## 2014-12-19 MED ORDER — TRACE MINERALS CR-CU-F-FE-I-MN-MO-SE-ZN IV SOLN
INTRAVENOUS | Status: AC
  Administered 2014-12-19: 18:00:00 via INTRAVENOUS
  Filled 2014-12-19: qty 2400

## 2014-12-19 MED ORDER — FAT EMULSION 20 % IV EMUL
250.0000 mL | INTRAVENOUS | Status: AC
  Administered 2014-12-19: 250 mL via INTRAVENOUS
  Filled 2014-12-19: qty 250

## 2014-12-19 NOTE — Progress Notes (Signed)
1 Day Post-Op  Subjective: Awake on the vent, comfortable  Objective: Vital signs in last 24 hours: Temp:  [97.8 F (36.6 C)-99.7 F (37.6 C)] 98.9 F (37.2 C) (02/14 0400) Pulse Rate:  [75-159] 79 (02/14 0700) Resp:  [12-20] 17 (02/14 0700) BP: (78-140)/(37-66) 124/45 mmHg (02/14 0700) SpO2:  [94 %-99 %] 96 % (02/14 0700) Arterial Line BP: (74-148)/(40-50) 127/49 mmHg (02/14 0700) FiO2 (%):  [40 %] 40 % (02/14 0717) Weight:  [245 lb 9.5 oz (111.4 kg)] 245 lb 9.5 oz (111.4 kg) (02/14 0400)    Intake/Output from previous day: 02/13 0701 - 02/14 0700 In: 6838.8 [I.V.:2661.3; ZOXWR:6045Blood:1558; NG/GT:30; IV Piggyback:950; TPN:1639.5] Out: 1360 [Urine:960; Emesis/NG output:400] Intake/Output this shift:    Abdomen soft, wound VAC in place  Lab Results:   Recent Labs  12/13/2014 0405 12/31/2014 1306  WBC 8.6 5.5  HGB 8.8* 10.2*  HCT 26.2* 30.1*  PLT 80* 74*   BMET  Recent Labs  12/15/2014 0405 12/19/14 0430  NA 134* 132*  K 4.3 4.3  CL 105 107  CO2 26 22  GLUCOSE 127* 152*  BUN 30* 37*  CREATININE 1.06 1.34  CALCIUM 7.5* 7.4*   PT/INR  Recent Labs  12/17/2014 0405 12/29/2014 1306  LABPROT 23.0* 19.9*  INR 2.02* 1.67*   ABG  Recent Labs  06/10/2015 1917 12/17/14 1602  PHART 7.196* 7.357  HCO3 16.1* 19.6*    Studies/Results: Dg Chest Port 1 View  12/08/2014   CLINICAL DATA:  Hypoxia/respiratory failure  EXAM: PORTABLE CHEST - 1 VIEW  COMPARISON:  December 17, 2014  FINDINGS: Endotracheal tube tip is 2.2 cm above the carina. Nasogastric tube tip and side port are in the stomach. Central catheter tip is in the superior vena cava. No pneumothorax. There is left lower lobe consolidation with small left effusion. There is a small right effusion with right base atelectasis. Heart is mildly enlarged. Patient is status post coronary artery bypass grafting. No adenopathy.  IMPRESSION: Tube and catheter positions as described without pneumothorax. Areas of consolidation with  effusions. Heart prominent but stable.   Electronically Signed   By: Bretta BangWilliam  Woodruff III M.D.   On: 12/24/2014 14:48   Dg Chest Port 1 View  12/17/2014   CLINICAL DATA:  Respiratory failure.  EXAM: PORTABLE CHEST - 1 VIEW  COMPARISON:  December 16, 2014.  FINDINGS: Stable cardiomediastinal silhouette. Endotracheal and nasogastric tubes are unchanged in position. Left internal jugular catheter is also noted with distal tip overlying expected position of the SVC. Status post coronary artery bypass graft. Bibasilar opacities are noted most consistent with subsegmental atelectasis. No pneumothorax is noted. No significant pleural effusion is noted.  IMPRESSION: Stable support apparatus. Stable bibasilar subsegmental atelectasis.   Electronically Signed   By: Lupita RaiderJames  Green Jr, M.D.   On: 12/17/2014 07:53    Anti-infectives: Anti-infectives    Start     Dose/Rate Route Frequency Ordered Stop   06/10/2015 1430  piperacillin-tazobactam (ZOSYN) IVPB 3.375 g     3.375 g 12.5 mL/hr over 240 Minutes Intravenous 3 times per day 06/10/2015 1403        Assessment/Plan: s/p Procedure(s): EXPLORATORY LAPAROTOMY (N/A)  Continue NPO, NG and wound VAC  LOS: 3 days    Marcquis Ridlon A 12/19/2014

## 2014-12-19 NOTE — Progress Notes (Signed)
PULMONARY / CRITICAL CARE MEDICINE   Name: Kyle West MRN: [id number] DOB: 1950/12/02    ADMISSION DATE:  12/09/2014   REFERRING MD :  OSH  CHIEF COMPLAINT:  abd pain   INITIAL PRESENTATION:  64yo male with hx HTN, CAD s/p CABG, cirrhosis of unclear etiology ([facility]), reported recent hx pancreatitis.  Presented to OSH 2/11 with 2 day hx worsening abd pain, nausea and vomiting.  CT at outside hospital revealed pneumatosis, hepatic and portal venous gas and infrarenal AAA.  [facility] eval with concern for ischemic bowel. Underwent laparotomy shortly after admission  MAJOR EVENTS/TEST RESULTS: 2/11 CT abd/pelvis: dilation and pneumatosis of mid and distal small bowel with moderate ascites.  Air in smv and mpv and extending into hepatic veins.  No gi obstruction.  Infrarenal AAA.  Cirrhotic changes in liver.   2/11 exploratory laparotomy, small bowel resection, application of wound vac (Dr [name]). Left intubated post op. Post op hypotension requiring CVL placement, volume resuscitation, vasopressors 2/12 sinus tachycardia, new onset AFRVR. Vasopressors changed from NE to PE. Low dose metoprolol initiated 2/13>> back to OR, side-to-side small bowel anastomosis & closure of fascia.  Persistent AFib with RVR.  Levo added   SUBJECTIVE:  Converted to nSR RASS 0, denies pain BP improved with levophed, low diastolic  VITAL SIGNS: Temp:  [30.8[97.8 F (36.6 C)-99.7 F (37.6 C)] 98.9 F (37.2 C) (02/14 0400) Pulse Rate:  [75-159] 79 (02/14 0700) Resp:  [12-20] 17 (02/14 0700) BP: (78-140)/(37-62) 124/45 mmHg (02/14 0700) SpO2:  [94 %-99 %] 96 % (02/14 0700) Arterial Line BP: (74-148)/(40-50) 127/49 mmHg (02/14 0700) FiO2 (%):  [40 %] 40 % (02/14 0717) Weight:  [111.4 kg (245 lb 9.5 oz)] 111.4 kg (245 lb 9.5 oz) (02/14 0400) HEMODYNAMICS: CVP:  [9 mmHg-14 mmHg] 13 mmHg VENTILATOR SETTINGS: Vent Mode:  [-] PSV;CPAP FiO2 (%):  [40 %] 40 % Set Rate:  [15  bmp] 15 bmp Vt Set:  [600 mL] 600 mL PEEP:  [5 cmH20] 5 cmH20 Pressure Support:  [15 cmH20] 15 cmH20 Plateau Pressure:  [12 cmH20-18 cmH20] 12 cmH20 INTAKE / OUTPUT:  Intake/Output Summary (Last 24 hours) at 12/19/14 0749 Last data filed at 12/19/14 0700  Gross per 24 hour  Intake 6758.81 ml  Output   1360 ml  Net 5398.81 ml    PHYSICAL EXAMINATION: General:  RASS 0, post op, NAD  Neuro:  Sedated post op, no focal deficit  HEENT:  WNL Cardiovascular: IRIR, no M noted Lungs: resps even non labored on vent, slightly coarse throughout Abdomen:  Soft, absent BS, small wound vac midline  Ext: warm, no edema  LABS:  CBC  Recent Labs Lab 12/17/14 0440 12/22/2014 0405 12/07/2014 1306  WBC 15.3* 8.6 5.5  HGB 11.0* 8.8* 10.2*  HCT 32.4* 26.2* 30.1*  PLT 101* 80* 74*   Coag's  Recent Labs Lab 12/27/2014 1325 12/20/2014 0405 12/19/2014 1306  INR 1.53* 2.02* 1.67*   BMET  Recent Labs Lab 12/17/14 2222 12/15/2014 0405 12/19/14 0430  NA 132* 134* 132*  K 4.4 4.3 4.3  CL 105 105 107  CO2 24 26 22   BUN 34* 30* 37*  CREATININE 1.25 1.06 1.34  GLUCOSE 122* 127* 152*   Electrolytes  Recent Labs Lab 12/17/14 2222 12/12/2014 0405 12/19/14 0430  CALCIUM 7.3* 7.5* 7.4*  MG  --  1.7 2.2  PHOS  --  2.4 2.8   Sepsis Markers  Recent Labs Lab 12/11/2014 1325  12/17/14 2219 12/21/2014 0057 12/16/2014 1306  LATICACIDVEN  --   < > 1.6 1.4 1.3  PROCALCITON 4.90  --   --   --   --   < > = values in this interval not displayed. ABG  Recent Labs Lab 01-07-2015 1917 12/17/14 1602  PHART 7.196* 7.357  PCO2ART 41.5 35.9  PO2ART 79.0* 100.0   Liver Enzymes  Recent Labs Lab 01/07/15 1325 12/27/2014 0405  AST 74* 52*  ALT 26 21  ALKPHOS 110 39  BILITOT 2.9* 1.8*  ALBUMIN 2.6* 2.5*   Cardiac Enzymes  Recent Labs Lab 12/24/2014 1306 12/11/2014 1815 12/19/14 0015  TROPONINI <0.03 <0.03 0.03   Glucose  Recent Labs Lab 12/16/2014 0734 12/29/2014 1104 12/27/2014 1541  12/15/2014 2046 12/19/14 0011 12/19/14 0429  GLUCAP 137* 113* 126* 150* 153* 159*     ASSESSMENT / PLAN:  PULMONARY A: VDRF - in setting sepsis, post op exp lap r/t bowel ischemia  P:   Daily SBT with goal extubation    CARDIOVASCULAR A:  ETT 2/11 >>  L IJ CVL 2/11 >>  Shock - likely hypovolemic and septic PAF with RVR -- converted to nSR 2/13 AAA - nondissecting P:  CVP goal 10-14.  SBP goal  100-110 Cont neo gtt and titrate as able  Cont amio gtt   Low dose scheduled metoprolol - ? B blocker withdrawal  No anticoagulation for now with exp lap    RENAL A:   AKI, nonoliguric - Cr improving Mild hyponatremia Resolving lactic acidosis P:   Monitor BMET intermittently Monitor I/Os Correct electrolytes as indicated   GASTROINTESTINAL A:   Post laparotomy with bowel resection, repeat OR for fascial closure 2/13 Post op ileus H/O cirrhosis documented - details unknown Elevated LFTs P:   SUP: IV pantoprazole TPN per surgery      HEMATOLOGIC A:   Mild anemia without acute blood loss Mild thrombocytopenia P:  DVT px: SCDs F/u cbc  Transfuse per usual ICU guidelines   INFECTIOUS A:   Pip-tazo 2/11 >>  Severe sepsis - bowel ischemia Elevated PCT (4.90 2/11) P:   Monitor temp, WBC count Micro and abx as above   ENDOCRINE A:   Stress induced hyperglycemia P:   Cont SSI    NEUROLOGIC A:   ICU associated discomfort Ppost op pain P:   RASS goal: -1, -2 Fentanyl gtt    FAMILY  - Updates: no family available 2/13  Summary - weaning well even though on pressors, will aim for sBP rather than MAP  The patient is critically ill with multiple organ systems failure and requires high complexity decision making for assessment and support, frequent evaluation and titration of therapies, application of advanced monitoring technologies and extensive interpretation of multiple databases. Critical Care Time devoted to patient care services described  in this note independent of APP time is 35 minutes.    12/19/2014  7:49 AM   ALVA,RAKESH V.

## 2014-12-19 NOTE — Progress Notes (Signed)
PARENTERAL NUTRITION CONSULT NOTE - INITIAL  Pharmacy Consult for TPN Indication: Prolonged Ileus  Patient Measurements: Height:  (185.4 cm) Weight: 245 lb 9.5 oz (111.4 kg) IBW/kg (Calculated) : 79.9   Vital Signs: Temp: 99.2 F (37.3 C) (02/14 0745) Temp Source: Axillary (02/14 0745) BP: 122/40 mmHg (02/14 0745) Pulse Rate: 85 (02/14 0745) Intake/Output from previous day: 02/13 0701 - 02/14 0700 In: 6838.8 [I.V.:2661.3; Blood:1558; NG/GT:30; IV Piggyback:950; TPN:1639.5] Out: 1360 [Urine:960; Emesis/NG output:400] Intake/Output from this shift:    Labs:  Recent Labs  12/08/2014 1325  12/17/14 0440 01/01/2015 0405 12/06/2014 1306  WBC  --   < > 15.3* 8.6 5.5  HGB  --   < > 11.0* 8.8* 10.2*  HCT  --   < > 32.4* 26.2* 30.1*  PLT  --   < > 101* 80* 74*  INR 1.53*  --   --  2.02* 1.67*  < > = values in this interval not displayed.   Recent Labs  12/24/2014 1057 12/07/2014 1325  12/17/14 2222 12/14/2014 0405 12/19/14 0430  NA  --  139  < > 132* 134* 132*  K  --  4.4  < > 4.4 4.3 4.3  CL  --  104  < > 105 105 107  CO2  --  18*  < > GLUCOSE  --  111*  < > 122* 127* 152*  BUN  --  29*  < > 34* 30* 37*  CREATININE  --  1.58*  < > 1.25 1.06 1.34  CALCIUM  --  8.7  < > 7.3* 7.5* 7.4*  MG  --   --   --   --  1.7 2.2  PHOS  --   --   --   --  2.4 2.8  PROT  --  5.5*  --   --  4.2*  --   ALBUMIN  --  2.6*  --   --  2.5*  --   AST  --  74*  --   --  52*  --   ALT  --  26  --   --  21  --   ALKPHOS  --  110  --   --  39  --   BILITOT  --  2.9*  --   --  1.8*  --   TRIG 48  --   --   --  30  --   < > = values in this interval not displayed. Estimated Creatinine Clearance: 72.9 mL/min (by C-G formula based on Cr of 1.34).    Recent Labs  12/18/14 2046 12/19/14 0011 12/19/14 0429  GLUCAP 150* 153* 159*    Medications:  Scheduled:  . sodium chloride   Intravenous Once  . sodium chloride  10 mL/hr Intravenous Once  . chlorhexidine  15 mL Mouth Rinse BID   . insulin aspart  0-9 Units Subcutaneous 6 times per day  . metoprolol  2.5 mg Intravenous 4 times per day  . pantoprazole (PROTONIX) IV  40 mg Intravenous Q24H  . piperacillin-tazobactam (ZOSYN)  IV  3.375 g Intravenous 3 times per day   . Insulin Requirements in the past 24 hours:  7 units SSI  Current Nutrition:  NPO  Admit:  64 year old man transferred from outside hospital for concern of ischemic bowel.  He underwent surgery on 2/11 - exploratory laparotomy, small bowel resection and application of a wound vac.  TPN to start  in anticipation of prolonged bowel rest/ileus.    Nutritional Goals: (per RD note on 2/12 - Thanks!)  1515-1840 kcal, >= 167g protein while on vent  2200-2400 kcal, 120-135g  of protein per day when off vent  GI: S/p ex. Lap with SBR on 2/11.  Also history of pancreatitis and cirrhosis child pugh B.  Lactulose, PPI, Rifaximin, spironolactione, Ursodiol  PTA.  Propofol discontinue 12/17/14.  Receiving IV PPI.  Back to OR 2/13 (creation of a side-to-side small bowel anastomosis and closure of abdomen)  Endo: No history of diabetes.  CBG 150-159.  Lytes: K 4.3, Na 132, Mg 2.2, Phos 2.4, Ca 7.4 (corrected 8.7)  Renal: Scr 1.34 today.  CrCl 73 mL/min.  UOP 0.4 mL/kg/hr  Pulm: Intubated post op - 40% FiO2  Cards: History of CAD on simvastatin and metoprolol pta.  Hypotensive and on pressors now.  Hepatobil: history of cirrhosis.  AST 52, ALT 21, Tbili 1.8, INR 1.67  Neuro: Zoloft PTA for depression  ID:   Zosyn for intra-abdominal coverage (2/11>>).  Afebrile.    Best Practices: SCD on  TPN Access: Triple Lumen IJ (placed 2/11)  TPN day#: 3 (12/17/14>>)   Plan:  Increase Clinimix E 5/15 to 18900mL/hr with MVI and Trace elements and lipids at 3510mL/hr.  This will provide 120g protein and 2297Kcal. This meets >100% of Kcal needs and 72% of protein needs (based on vent goals).  I am trying to balance the need for increased protein with the lower KCal  requirement while on the vent - this is a challenge as we have the fixed clinimix formulas.  TPN labs in AM Continue SSI Add 15 units of insulin to TPN bag  Mickeal SkinnerFrens, Jniya Madara John 12/19/2014,8:42 AM

## 2014-12-20 ENCOUNTER — Encounter (HOSPITAL_COMMUNITY): Payer: Self-pay | Admitting: Surgery

## 2014-12-20 DIAGNOSIS — J9601 Acute respiratory failure with hypoxia: Secondary | ICD-10-CM

## 2014-12-20 DIAGNOSIS — K559 Vascular disorder of intestine, unspecified: Secondary | ICD-10-CM

## 2014-12-20 LAB — TYPE AND SCREEN
ABO/RH(D): O POS
Antibody Screen: NEGATIVE
Unit division: 0
Unit division: 0
Unit division: 0
Unit division: 0

## 2014-12-20 LAB — CBC
HCT: 27.9 % — ABNORMAL LOW (ref 39.0–52.0)
Hemoglobin: 9.5 g/dL — ABNORMAL LOW (ref 13.0–17.0)
MCH: 34.7 pg — ABNORMAL HIGH (ref 26.0–34.0)
MCHC: 34.1 g/dL (ref 30.0–36.0)
MCV: 101.8 fL — ABNORMAL HIGH (ref 78.0–100.0)
Platelets: 82 10*3/uL — ABNORMAL LOW (ref 150–400)
RBC: 2.74 MIL/uL — AB (ref 4.22–5.81)
RDW: 18.1 % — ABNORMAL HIGH (ref 11.5–15.5)
WBC: 11.7 10*3/uL — ABNORMAL HIGH (ref 4.0–10.5)

## 2014-12-20 LAB — COMPREHENSIVE METABOLIC PANEL
ALBUMIN: 2.2 g/dL — AB (ref 3.5–5.2)
ALK PHOS: 63 U/L (ref 39–117)
ALT: 18 U/L (ref 0–53)
AST: 50 U/L — AB (ref 0–37)
Anion gap: 3 — ABNORMAL LOW (ref 5–15)
BUN: 40 mg/dL — ABNORMAL HIGH (ref 6–23)
CALCIUM: 7.3 mg/dL — AB (ref 8.4–10.5)
CO2: 26 mmol/L (ref 19–32)
Chloride: 104 mmol/L (ref 96–112)
Creatinine, Ser: 1.29 mg/dL (ref 0.50–1.35)
GFR, EST AFRICAN AMERICAN: 66 mL/min — AB (ref >90)
GFR, EST NON AFRICAN AMERICAN: 57 mL/min — AB (ref >90)
Glucose, Bld: 129 mg/dL — ABNORMAL HIGH (ref 70–99)
POTASSIUM: 4.2 mmol/L (ref 3.5–5.1)
SODIUM: 133 mmol/L — AB (ref 135–145)
TOTAL PROTEIN: 4.4 g/dL — AB (ref 6.0–8.3)
Total Bilirubin: 2.4 mg/dL — ABNORMAL HIGH (ref 0.3–1.2)

## 2014-12-20 LAB — GLUCOSE, CAPILLARY
GLUCOSE-CAPILLARY: 124 mg/dL — AB (ref 70–99)
GLUCOSE-CAPILLARY: 131 mg/dL — AB (ref 70–99)
Glucose-Capillary: 110 mg/dL — ABNORMAL HIGH (ref 70–99)
Glucose-Capillary: 119 mg/dL — ABNORMAL HIGH (ref 70–99)
Glucose-Capillary: 133 mg/dL — ABNORMAL HIGH (ref 70–99)
Glucose-Capillary: 142 mg/dL — ABNORMAL HIGH (ref 70–99)

## 2014-12-20 LAB — DIFFERENTIAL
Basophils Absolute: 0 10*3/uL (ref 0.0–0.1)
Basophils Relative: 0 % (ref 0–1)
Eosinophils Absolute: 0.6 10*3/uL (ref 0.0–0.7)
Eosinophils Relative: 5 % (ref 0–5)
LYMPHS PCT: 11 % — AB (ref 12–46)
Lymphs Abs: 1.2 10*3/uL (ref 0.7–4.0)
Monocytes Absolute: 1.6 10*3/uL — ABNORMAL HIGH (ref 0.1–1.0)
Monocytes Relative: 14 % — ABNORMAL HIGH (ref 3–12)
Neutro Abs: 8.2 10*3/uL — ABNORMAL HIGH (ref 1.7–7.7)
Neutrophils Relative %: 70 % (ref 43–77)

## 2014-12-20 LAB — TRIGLYCERIDES: Triglycerides: 54 mg/dL (ref <150)

## 2014-12-20 LAB — PHOSPHORUS: Phosphorus: 3.5 mg/dL (ref 2.3–4.6)

## 2014-12-20 LAB — PREALBUMIN: PREALBUMIN: 5.1 mg/dL — AB (ref 17.0–34.0)

## 2014-12-20 LAB — MAGNESIUM: MAGNESIUM: 2.4 mg/dL (ref 1.5–2.5)

## 2014-12-20 MED ORDER — FAT EMULSION 20 % IV EMUL
240.0000 mL | INTRAVENOUS | Status: AC
  Administered 2014-12-20: 240 mL via INTRAVENOUS
  Filled 2014-12-20: qty 250

## 2014-12-20 MED ORDER — TRACE MINERALS CR-CU-F-FE-I-MN-MO-SE-ZN IV SOLN
INTRAVENOUS | Status: AC
  Administered 2014-12-20: 18:00:00 via INTRAVENOUS
  Filled 2014-12-20: qty 2400

## 2014-12-20 NOTE — Progress Notes (Signed)
PARENTERAL NUTRITION CONSULT NOTE - FOLLOW UP  Pharmacy Consult for TPN Indication: Prolonged ileus  Allergies  Allergen Reactions  . Oxycodone Hives and Other (See Comments)    Hallucinations    Patient Measurements: Height: 6\' 1"  (185.4 cm) Weight: 251 lb 1.7 oz (113.9 kg) IBW/kg (Calculated) : 79.9 Adjusted Body Weight:  Usual Weight:   Vital Signs: Temp: 99.1 F (37.3 C) (02/15 0721) Temp Source: Axillary (02/15 0721) BP: 116/43 mmHg (02/15 0800) Pulse Rate: 81 (02/15 0845) Intake/Output from previous day: 02/14 0701 - 02/15 0700 In: 4755.8 [I.V.:2041.9; NG/GT:120; IV Piggyback:137.5; TPN:2456.4] Out: 1560 [Urine:1210; Emesis/NG output:300; Drains:50] Intake/Output from this shift: Total I/O In: 226.7 [I.V.:74.2; NG/GT:30; IV Piggyback:12.5; TPN:110] Out: 165 [Urine:135; Emesis/NG output:30]  Labs:  Recent Labs  Mar 15, 2015 0405 Mar 15, 2015 1306 12/19/14 0839 12/20/14 0340  WBC 8.6 5.5 11.5* 11.7*  HGB 8.8* 10.2* 9.8* 9.5*  HCT 26.2* 30.1* 28.3* 27.9*  PLT 80* 74* 91* 82*  INR 2.02* 1.67*  --   --      Recent Labs  Mar 15, 2015 0405 12/19/14 0430 12/20/14 0340  NA 134* 132* 133*  K 4.3 4.3 4.2  CL 105 107 104  CO2 26 22 26   GLUCOSE 127* 152* 129*  BUN 30* 37* 40*  CREATININE 1.06 1.34 1.29  CALCIUM 7.5* 7.4* 7.3*  MG 1.7 2.2 2.4  PHOS 2.4 2.8 3.5  PROT 4.2*  --  4.4*  ALBUMIN 2.5*  --  2.2*  AST 52*  --  50*  ALT 21  --  18  ALKPHOS 39  --  63  BILITOT 1.8*  --  2.4*  PREALBUMIN 6.4*  --   --   TRIG 30  --  54   Estimated Creatinine Clearance: 76.5 mL/min (by C-G formula based on Cr of 1.29).    Recent Labs  12/19/14 1949 12/20/14 12/20/14 0343  GLUCAP 123* 119* 110*    Medications:  Scheduled:  . sodium chloride   Intravenous Once  . sodium chloride  10 mL/hr Intravenous Once  . chlorhexidine  15 mL Mouth Rinse BID  . insulin aspart  0-9 Units Subcutaneous 6 times per day  . metoprolol  2.5 mg Intravenous 4 times per day  .  pantoprazole (PROTONIX) IV  40 mg Intravenous Q24H  . piperacillin-tazobactam (ZOSYN)  IV  3.375 g Intravenous 3 times per day    Insulin Requirements in the past 24 hours:  1 units sensitive SSI, 15 units insulin in TPN  Current Nutrition:  NPO Clinimix-E 5/15 at 15600ml/hr and Lipids at 5110ml/hr, providing 120g protein and 2297Kcal. This meets >100% of Kcal needs and 72% of protein needs (based on vent goals).  Admit:  64 year old man transferred from outside hospital for concern of ischemic bowel. He underwent surgery on 2/11 - exploratory laparotomy, small bowel resection and application of a wound vac. TPN to start in anticipation of prolonged bowel rest/ileus.   Nutritional Goals: (per RD note on 2/12 - Thanks!) 1515-1840 kcal, >= 167g protein while on vent  2200-2400 kcal, 120-135g of protein per day when off vent  GI: S/p ex. Lap with SBR on 2/11. Also history of pancreatitis and cirrhosis child pugh B- PTA:  Lactulose, PPI, Rifaximin, spironolactione, Ursodiol. S/P side-to-side small bowel anastomosis and closure of abdomen on 2/13.  NG output 300/24hr and 50mL from drain.  PPI-IV  Endo: No history of diabetes. CBG 110-123, well-controlled with addition of insulin to TPN on 2/14  Lytes: K 4.2, Na 133, Mg  2.4, Phos 3.5, Ca 7.4 (corrected 8.7)  Renal:  AKI- improving.  Scr 1.29 today. UOP 0.4 mL/kg/hr  Pulm: Intubated post op - 40% FiO2.  Considering extubation today  Cards: History of CAD on simvastatin and metoprolol pta. Hypotensive, HR wnl.  Amiodarone, Phenylephrine drips, Metoprolol IV  Hepatobil: history of cirrhosis. AST 50 , ALT wnl, Tbili 2.4, INR 1.67 (2/13).  Baseline palb low at 6.4 & TG 54  Neuro: Zoloft PTA for depression.  Fentanyl drip- to be d/c'd 2/15, Fent prn  ID: Zosyn for intra-abdominal coverage (2/11>>). Afebrile, WBC 11.7 ~stable, no culture data   Best Practices: SCDs, mouthcare  TPN Access: Triple Lumen IJ (placed 2/11) TPN day#:   12/17/14 >>  Plan:  Cont Clinimix E 5/15 125mL/hr with MVI and Trace elements and lipids at 78mL/hr.  Trying to balance the need for inc protein needs with lower KCal requirement while on the vent - will plan to advance 2/16 if pt able to extubated as planned Continue SSI Add 15 units of insulin to TPN bag BMet in AM  Marisue Humble, PharmD Clinical Pharmacist [facility]

## 2014-12-20 NOTE — Progress Notes (Signed)
Patient ID: Kyle West, male   DOB: 1950/12/30, 64 y.o.   MRN: [id number] 2 Days Post-Op  Subjective: Pt waking up for WUA.  No BM. Weaning neo  Objective: Vital signs in last 24 hours: Temp:  [97.4 F (36.3 C)-99.6 F (37.6 C)] 99.1 F (37.3 C) (02/15 0721) Pulse Rate:  [71-86] 81 (02/15 0845) Resp:  [10-21] 14 (02/15 0800) BP: (97-128)/(38-67) 116/43 mmHg (02/15 0800) SpO2:  [94 %-98 %] 96 % (02/15 0800) Arterial Line BP: (74-139)/(39-55) 102/43 mmHg (02/15 0845) FiO2 (%):  [40 %] 40 % (02/15 0800) Weight:  [251 lb 1.7 oz (113.9 kg)] 251 lb 1.7 oz (113.9 kg) (02/15 0500)    Intake/Output from previous day: 02/14 0701 - 02/15 0700 In: 4755.8 [I.V.:2041.9; NG/GT:120; IV Piggyback:137.5; TPN:2456.4] Out: 1560 [Urine:1210; Emesis/NG output:300; Drains:50] Intake/Output this shift: Total I/O In: 226.7 [I.V.:74.2; NG/GT:30; IV Piggyback:12.5; TPN:110] Out: 165 [Urine:135; Emesis/NG output:30]  PE: Abd: soft, NGT with only 150cc of some bilious output, wound VAC in place with serosang drainage.  Hypoactive BS Heart: regular, + murmur  Lab Results:   Recent Labs  12/19/14 0839 12/20/14 0340  WBC 11.5* 11.7*  HGB 9.8* 9.5*  HCT 28.3* 27.9*  PLT 91* 82*   BMET  Recent Labs  12/19/14 0430 12/20/14 0340  NA 132* 133*  K 4.3 4.2  CL 107 104  CO2 22 26  GLUCOSE 152* 129*  BUN 37* 40*  CREATININE 1.34 1.29  CALCIUM 7.4* 7.3*   PT/INR  Recent Labs  14-Sep-2015 0405 14-Sep-2015 1306  LABPROT 23.0* 19.9*  INR 2.02* 1.67*   CMP     Component Value Date/Time   NA 133* 12/20/2014 0340   K 4.2 12/20/2014 0340   CL 104 12/20/2014 0340   CO2 26 12/20/2014 0340   GLUCOSE 129* 12/20/2014 0340   BUN 40* 12/20/2014 0340   CREATININE 1.29 12/20/2014 0340   CALCIUM 7.3* 12/20/2014 0340   PROT 4.4* 12/20/2014 0340   ALBUMIN 2.2* 12/20/2014 0340   AST 50* 12/20/2014 0340   ALT 18 12/20/2014 0340   ALKPHOS 63 12/20/2014 0340   BILITOT 2.4* 12/20/2014 0340   GFRNONAA 57* 12/20/2014 0340   GFRAA 66* 12/20/2014 0340   Lipase     Component Value Date/Time   LIPASE 51 12/27/2014 1325       Studies/Results: Dg Chest Portable 1 View  12/19/2014   CLINICAL DATA:  Respiratory failure.  EXAM: PORTABLE CHEST - 1 VIEW  COMPARISON:  12/22/2014  FINDINGS: Endotracheal tube is in place with tip just above the level of the carina. Nasogastric tube is in place with tip overlying the level of the stomach. Patient has had median sternotomy and CABG. Left IJ central line tip overlies the superior vena cava.  The heart is enlarged. There is dense opacity of the left lung base consistent with atelectasis or infiltrate. Small bilateral pleural effusions are identified. There has been some improvement in aeration of the right lung base.  IMPRESSION: 1. Postoperative lines and tubes. 2. Slightly improved aeration of the right lung base. 3. Persistent left lung base atelectasis and effusion.   Electronically Signed   By: Norva PavlovElizabeth  Brown M.D.   On: 12/19/2014 08:04   Dg Chest Port 1 View  05-21-2015   CLINICAL DATA:  Hypoxia/respiratory failure  EXAM: PORTABLE CHEST - 1 VIEW  COMPARISON:  December 17, 2014  FINDINGS: Endotracheal tube tip is 2.2 cm above the carina. Nasogastric tube tip and side port are in the stomach. Central  catheter tip is in the superior vena cava. No pneumothorax. There is left lower lobe consolidation with small left effusion. There is a small right effusion with right base atelectasis. Heart is mildly enlarged. Patient is status post coronary artery bypass grafting. No adenopathy.  IMPRESSION: Tube and catheter positions as described without pneumothorax. Areas of consolidation with effusions. Heart prominent but stable.   Electronically Signed   By: Bretta Bang III M.D.   On: January 16, 2015 14:48    Anti-infectives: Anti-infectives    Start     Dose/Rate Route Frequency Ordered Stop   01/02/2015 1430  piperacillin-tazobactam (ZOSYN) IVPB 3.375 g      3.375 g 12.5 mL/hr over 240 Minutes Intravenous 3 times per day 12/24/2014 1403         Assessment/Plan  1. POD 4/2, s/p ex lap with open abdomen and SBR, s/p relook and closure 2. Cirrhosis 3. VDRF 4. Septic shock secondary to ischemia bowel, still on neo 5. Post op ileus 6. SPCN/TNA 7. Thrombocytopenia  Plan: 1. Cont to await bowel function prior to TFs vs feeding once extubated 2. VAC changes T,Th, S 3. Cont abx therapy 4. Cont TNA for nutritional support 5. Follow labs     LOS: 4 days    Saman Giddens E 12/20/2014, 9:27 AM Pager: [phone]

## 2014-12-20 NOTE — Progress Notes (Addendum)
100mL of fentanyl drip wasted down sink, witnessed by Alyce PaganAllison Haggard, RN

## 2014-12-20 NOTE — Procedures (Signed)
Extubation Procedure Note  Patient Details:   Name: Kyle West DOB: July 19, 1951 MRN: [id number]   Airway Documentation:     Evaluation  O2 sats: stable throughout Complications: No apparent complications Patient did tolerate procedure well. Bilateral Breath Sounds: Clear Suctioning: Airway Yes   Patient extubated to 4L nasal cannula per MD order.  Sats currently 94%.  Vitals are stable.  Positive cuff leak noted.  No evidence of stridor.  Patient able to speak post extubation.  No apparent complications.  Durwin GlazeBrown, Loyola Santino N 12/20/2014, 12:20 PM

## 2014-12-20 NOTE — Progress Notes (Signed)
UR Completed.  [phone]  

## 2014-12-20 NOTE — Progress Notes (Signed)
PULMONARY / CRITICAL CARE MEDICINE   Name: Kyle West MRN: [id number] DOB: Aug 11, 1951    ADMISSION DATE:  04-Jan-2015   REFERRING MD :  OSH  CHIEF COMPLAINT:  abd pain   INITIAL PRESENTATION:  64yo male with hx HTN, CAD s/p CABG, cirrhosis of unclear etiology (followed at [facility]), reported recent hx pancreatitis.  Presented to OSH 2/11 with 2 day hx worsening abd pain, nausea and vomiting.  CT at outside hospital revealed pneumatosis, hepatic and portal venous gas and infrarenal AAA.  He was tx to [facility] for further eval with concern for ischemic bowel. Underwent laparotomy shortly after admission  MAJOR EVENTS/TEST RESULTS: 2/11 CT abd/pelvis: dilation and pneumatosis of mid and distal small bowel with moderate ascites.  Air in smv and mpv and extending into hepatic veins.  No gi obstruction.  Infrarenal AAA.  Cirrhotic changes in liver.   2/11 exploratory laparotomy, small bowel resection, application of wound vac (Dr Luisa Hart). Left intubated post op. Post op hypotension requiring CVL placement, volume resuscitation, vasopressors 2/12 sinus tachycardia, new onset AFRVR. Vasopressors changed from NE to PE. Low dose metoprolol initiated 2/13>> back to OR, side-to-side small bowel anastomosis & closure of fascia.  Persistent AFib with RVR.  Levo added   SUBJECTIVE:  Now NSR 80's.  Tol PS 5/5.  Remains on pressors but weaning.   VITAL SIGNS: Temp:  [97.4 F (36.3 C)-99.6 F (37.6 C)] 99.1 F (37.3 C) (02/15 0721) Pulse Rate:  [71-86] 81 (02/15 0800) Resp:  [10-21] 14 (02/15 0800) BP: (97-128)/(31-67) 116/43 mmHg (02/15 0800) SpO2:  [94 %-98 %] 96 % (02/15 0800) Arterial Line BP: (74-139)/(39-55) 101/44 mmHg (02/15 0700) FiO2 (%):  [40 %] 40 % (02/15 0731) Weight:  [251 lb 1.7 oz (113.9 kg)] 251 lb 1.7 oz (113.9 kg) (02/15 0500) HEMODYNAMICS: CVP:  [6 mmHg-22 mmHg] 22 mmHg VENTILATOR SETTINGS: Vent Mode:  [-] PSV;CPAP FiO2 (%):  [40 %] 40 % Set Rate:  [15 bmp] 15 bmp Vt Set:   [600 mL] 600 mL PEEP:  [5 cmH20] 5 cmH20 Pressure Support:  [5 cmH20-10 cmH20] 5 cmH20 Plateau Pressure:  [10 cmH20-18 cmH20] 18 cmH20 INTAKE / OUTPUT:  Intake/Output Summary (Last 24 hours) at 12/20/14 0827 Last data filed at 12/20/14 0700  Gross per 24 hour  Intake 4521.03 ml  Output   1510 ml  Net 3011.03 ml    PHYSICAL EXAMINATION: General:  RASS 0, NAD  Neuro:  Awake, alert, follows commands, RASS 0, MAE gen weakness  HEENT:  Mm moist, pink , ETT  Cardiovascular: NSR 80's, no M noted Lungs: resps even non labored PS 5/5, slightly coarse, diminished bases  Abdomen:  Soft, absent BS, small wound vac midline  Ext: warm, no edema  LABS:  CBC  Recent Labs Lab 12/22/2014 1306 12/19/14 0839 12/20/14 0340  WBC 5.5 11.5* 11.7*  HGB 10.2* 9.8* 9.5*  HCT 30.1* 28.3* 27.9*  PLT 74* 91* 82*   Coag's  Recent Labs Lab Jan 04, 2015 1325 01/01/2015 0405 12/16/2014 1306  INR 1.53* 2.02* 1.67*   BMET  Recent Labs Lab 12/19/2014 0405 12/19/14 0430 12/20/14 0340  NA 134* 132* 133*  K 4.3 4.3 4.2  CL 105 107 104  CO2 BUN 30* 37* 40*  CREATININE 1.06 1.34 1.29  GLUCOSE 127* 152* 129*   Electrolytes  Recent Labs Lab 12/20/2014 0405 12/19/14 0430 12/20/14 0340  CALCIUM 7.5* 7.4* 7.3*  MG 1.7 2.2 2.4  PHOS 2.4 2.8 3.5   Sepsis Markers  Recent Labs Lab 12/07/2014 1325  12/17/14 2219 12/10/2014 0057 12/11/2014 1306  LATICACIDVEN  --   < > 1.6 1.4 1.3  PROCALCITON 4.90  --   --   --   --   < > = values in this interval not displayed. ABG  Recent Labs Lab 12/15/2014 1917 12/17/14 1602 12/19/14 1213  PHART 7.196* 7.357 7.258*  PCO2ART 41.5 35.9 49.2*  PO2ART 79.0* 100.0 80.0   Liver Enzymes  Recent Labs Lab 12/24/2014 1325 12/17/2014 0405 12/20/14 0340  AST 74* 52* 50*  ALT 26 21 18   ALKPHOS 110 39 63  BILITOT 2.9* 1.8* 2.4*  ALBUMIN 2.6* 2.5* 2.2*   Cardiac Enzymes  Recent Labs Lab 12/19/2014 1306 12/31/2014 1815 12/19/14 0015  TROPONINI <0.03  <0.03 0.03   Glucose  Recent Labs Lab 12/19/14 0748 12/19/14 1139 12/19/14 1513 12/19/14 1949 12/20/14 12/20/14 0343  GLUCAP 131* 153* 129* 123* 119* 110*     ASSESSMENT / PLAN:  PULMONARY A: VDRF - in setting sepsis, post op exp lap r/t bowel ischemia  P:   Daily SBT with goal extubation Consider extubation 2/15 despite pressors    CARDIOVASCULAR A:  ETT 2/11 >>  L IJ CVL 2/11 >>  Shock - likely hypovolemic and septic PAF with RVR -- converted to NSR 2/13 AAA - nondissecting P:  CVP goal 10-14.  SBP goal  100-110 Continue neo gtt and titrate as able  Continue amio gtt   Continue low dose scheduled metoprolol - ? B blocker withdrawal contibuting No anticoagulation for now with exp lap    RENAL A:   AKI, nonoliguric - Cr improving Mild hyponatremia Resolving lactic acidosis P:   F/u chem  Monitor I/Os Correct electrolytes as indicated   GASTROINTESTINAL A:   Post laparotomy with bowel resection, repeat OR for fascial closure 2/13 Post op ileus H/O cirrhosis documented - details unknown Elevated LFTs P:   SUP: IV pantoprazole TPN per surgery  No plans for further OR    HEMATOLOGIC A:   Mild anemia without acute blood loss Mild thrombocytopenia P:  DVT px: SCDs F/u cbc  Transfuse per usual ICU guidelines   INFECTIOUS A:   Pip-tazo 2/11 >>  Severe sepsis - bowel ischemia Elevated PCT (4.90 2/11) P:   Monitor temp, WBC count Cont abx    ENDOCRINE A:   Stress induced hyperglycemia P:   Cont SSI   NEUROLOGIC A:   ICU associated discomfort Ppost op pain P:   RASS goal: -1 D/c fentanyl gtt  PRN fentanyl    FAMILY  - Updates: no family available 2/15   Dirk DressKaty Whiteheart, NP 12/20/2014  8:27 AM Pager: [phone] or [phone]   Reviewed above, and examined.  He is tolerating SBT.  Denies dyspnea.  Remains on pressors, but BP improved.  Chest with scattered rhonchi.  Will proceed with extubation.  Wean off  pressors as tolerated.  Updated pts family at bedside.  CC time by me independent of APP time 35 minutes.  Coralyn HellingVineet Azariya Freeman, MD [facility] 12/20/2014, 11:21 AM Pager:  [phone] After 3pm call: [phone]

## 2014-12-21 DIAGNOSIS — R6521 Severe sepsis with septic shock: Secondary | ICD-10-CM

## 2014-12-21 DIAGNOSIS — K7031 Alcoholic cirrhosis of liver with ascites: Secondary | ICD-10-CM

## 2014-12-21 DIAGNOSIS — I4891 Unspecified atrial fibrillation: Secondary | ICD-10-CM

## 2014-12-21 DIAGNOSIS — A419 Sepsis, unspecified organism: Principal | ICD-10-CM

## 2014-12-21 LAB — BASIC METABOLIC PANEL
Anion gap: 8 (ref 5–15)
BUN: 39 mg/dL — ABNORMAL HIGH (ref 6–23)
CALCIUM: 7.8 mg/dL — AB (ref 8.4–10.5)
CO2: 21 mmol/L (ref 19–32)
CREATININE: 1.1 mg/dL (ref 0.50–1.35)
Chloride: 104 mmol/L (ref 96–112)
GFR calc Af Amer: 80 mL/min — ABNORMAL LOW (ref >90)
GFR calc non Af Amer: 69 mL/min — ABNORMAL LOW (ref >90)
GLUCOSE: 135 mg/dL — AB (ref 70–99)
Potassium: 3.7 mmol/L (ref 3.5–5.1)
Sodium: 133 mmol/L — ABNORMAL LOW (ref 135–145)

## 2014-12-21 LAB — CBC
HCT: 27.6 % — ABNORMAL LOW (ref 39.0–52.0)
Hemoglobin: 9.7 g/dL — ABNORMAL LOW (ref 13.0–17.0)
MCH: 34.3 pg — AB (ref 26.0–34.0)
MCHC: 35.1 g/dL (ref 30.0–36.0)
MCV: 97.5 fL (ref 78.0–100.0)
PLATELETS: 82 10*3/uL — AB (ref 150–400)
RBC: 2.83 MIL/uL — ABNORMAL LOW (ref 4.22–5.81)
RDW: 17.1 % — ABNORMAL HIGH (ref 11.5–15.5)
WBC: 9 10*3/uL (ref 4.0–10.5)

## 2014-12-21 LAB — GLUCOSE, CAPILLARY
GLUCOSE-CAPILLARY: 136 mg/dL — AB (ref 70–99)
GLUCOSE-CAPILLARY: 140 mg/dL — AB (ref 70–99)
GLUCOSE-CAPILLARY: 132 mg/dL — AB (ref 70–99)
GLUCOSE-CAPILLARY: 143 mg/dL — AB (ref 70–99)
Glucose-Capillary: 129 mg/dL — ABNORMAL HIGH (ref 70–99)
Glucose-Capillary: 169 mg/dL — ABNORMAL HIGH (ref 70–99)
Glucose-Capillary: 146 mg/dL — ABNORMAL HIGH (ref 70–99)
Glucose-Capillary: 135 mg/dL — ABNORMAL HIGH (ref 70–99)

## 2014-12-21 LAB — CLOSTRIDIUM DIFFICILE BY PCR: CDIFFPCR: NEGATIVE

## 2014-12-21 LAB — AMMONIA: Ammonia: 35 umol/L — ABNORMAL HIGH (ref 11–32)

## 2014-12-21 MED ORDER — LACTULOSE 10 GM/15ML PO SOLN
20.0000 g | Freq: Two times a day (BID) | ORAL | Status: DC
Start: 2014-12-21 — End: 2014-12-29
  Administered 2014-12-21 – 2014-12-29 (×15): 20 g via ORAL
  Filled 2014-12-21 (×17): qty 30

## 2014-12-21 MED ORDER — TRACE MINERALS CR-CU-F-FE-I-MN-MO-SE-ZN IV SOLN
INTRAVENOUS | Status: AC
  Administered 2014-12-21: 17:00:00 via INTRAVENOUS
  Filled 2014-12-21: qty 2640

## 2014-12-21 MED ORDER — HEPARIN SODIUM (PORCINE) 5000 UNIT/ML IJ SOLN
5000.0000 [IU] | Freq: Three times a day (TID) | INTRAMUSCULAR | Status: DC
  Administered 2014-12-21 – 2014-12-25 (×13): 5000 [IU] via SUBCUTANEOUS
  Filled 2014-12-21 (×20): qty 1

## 2014-12-21 MED ORDER — FAT EMULSION 20 % IV EMUL
240.0000 mL | INTRAVENOUS | Status: AC
  Administered 2014-12-21: 240 mL via INTRAVENOUS
  Filled 2014-12-21: qty 250

## 2014-12-21 MED ORDER — WHITE PETROLATUM GEL
Status: DC | PRN
  Administered 2014-12-21: 0.2 via TOPICAL
  Filled 2014-12-21 (×5): qty 28.35

## 2014-12-21 NOTE — Progress Notes (Signed)
NUTRITION FOLLOW UP  Intervention:   -TPN per pharmacy -RD to follow for diet advancement as medically appropriate   Nutrition Dx:   Inadequate oral intake related to inability to eat as evidenced by recent bowel surgery and NPO status; ongoing  Goal:   Pt will meet >90% of estimated nutritional needs  Monitor:   TPN tolerance, diet advancement, labs, weight changes, I/O's  Assessment:   64yo male with hx HTN, CAD s/p CABG, cirrhosis, reported recent hx pancreatitis. Presented with 2 day hx worsening abd pain, nausea and vomiting.  S/p procedures on 12/23/2014: EXPLORATORY LAPAROTOMY (N/A) SMALL BOWEL RESECTION (N/A) APPLICATION OF WOUND VAC (N/A)  Pt was extubated on 12/20/14. Nutritional needs recalculated due to change in status.  Spoke with pt RN who reports that she just clamped NGT for trial. She reports 200 ml output from NGT during this shift.  Pt remains on TPN- currently receiving Clinimix-E 5/15 at 166ml/hr and Lipids at 22ml/hr, providing 120g protein and 2297 kcals (which meets 100% of estimated kcal and protein needs). Per pharmacy note, TPN rate will be increased to 110 ml/hr at 1800 today to better meet nutritional goals. RN reports progression to TF vs PO diet will be determined based on pt's progression. Pt had loose stools last night; stool sample negative for C-diff. Noted 17# wt gain since last visit. Per RN, pt with some edema.  Labs reviewed. Na: 133, BUN: 39, Calcium: 7.8, Glucose: 135, CBGS: 129-169. K, Mg, and Phos.   Height: Ht Readings from Last 1 Encounters:  12/08/2014  (1.854 m)    Weight Status:   Wt Readings from Last 1 Encounters:  12/21/14 252 lb 10.4 oz (114.6 kg)   12/17/14 235 lb 14.3 oz (107 kg)       Re-estimated needs:  Kcal: 2200-2400 Protein: 120-135 grams Fluid: >2.2 L  Skin: abdominal incision with negative pressure wound therapy, stage II pressure ulcer on rt nose due to NGT per nurse  Diet Order: Diet NPO time  specified TPN (CLINIMIX-E) Adult TPN (CLINIMIX-E) Adult   Intake/Output Summary (Last 24 hours) at 12/21/14 1129 Last data filed at 12/21/14 1000  Gross per 24 hour  Intake 3645.31 ml  Output   2505 ml  Net 1140.31 ml    Last BM: 12/21/14   Labs:   Recent Labs Lab 2015/01/01 0405 12/19/14 0430 12/20/14 0340 12/21/14 0355  NA 134* 132* 133* 133*  K 4.3 4.3 4.2 3.7  CL 105 107 104 104  CO2 BUN 30* 37* 40* 39*  CREATININE 1.06 1.34 1.29 1.10  CALCIUM 7.5* 7.4* 7.3* 7.8*  MG 1.7 2.2 2.4  --   PHOS 2.4 2.8 3.5  --   GLUCOSE 127* 152* 129* 135*    CBG (last 3)   Recent Labs  12/20/14 2320 12/21/14 0328 12/21/14 0734  GLUCAP 142* 129* 169*    Scheduled Meds: . chlorhexidine  15 mL Mouth Rinse BID  . heparin subcutaneous  5,000 Units Subcutaneous 3 times per day  . insulin aspart  0-9 Units Subcutaneous 6 times per day  . metoprolol  2.5 mg Intravenous 4 times per day  . pantoprazole (PROTONIX) IV  40 mg Intravenous Q24H  . piperacillin-tazobactam (ZOSYN)  IV  3.375 g Intravenous 3 times per day    Continuous Infusions: . Marland KitchenTPN (CLINIMIX-E) Adult 100 mL/hr at 12/21/14 0700   And  . fat emulsion 480 kcal (12/21/14 0700)  . Marland KitchenTPN (CLINIMIX-E) Adult  And  . fat emulsion      Ediel Unangst A. Mayford KnifeWilliams, RD, LDN, CDE Pager: [phone] After hours Pager: [phone]

## 2014-12-21 NOTE — Progress Notes (Signed)
PULMONARY / CRITICAL CARE MEDICINE   Name: Kyle West MRN: [id number] DOB: 1951/02/10    ADMISSION DATE:  Jul 05, 2015   REFERRING MD :  OSH  CHIEF COMPLAINT:  abd pain   INITIAL PRESENTATION:  64yo male with hx HTN, CAD s/p CABG, cirrhosis of unclear etiology ([facility]), reported recent hx pancreatitis.  Presented to OSH 2/11 with 2 day hx worsening abd pain, nausea and vomiting.  CT at outside hospital revealed pneumatosis, hepatic and portal venous gas and infrarenal AAA.  [facility] with concern for ischemic bowel. Underwent laparotomy shortly after admission  MAJOR EVENTS: 2/11 exploratory laparotomy, small bowel resection, application of wound vac (Dr [name]). Left intubated post op. Post op hypotension requiring CVL placement, volume resuscitation, vasopressors 2/12 sinus tachycardia, new onset AFRVR. Vasopressors changed from NE to PE. Low dose metoprolol initiated 2/13 back to OR, side-to-side small bowel anastomosis & closure of fascia.  Persistent AFib with RVR. Levo added. 2/15 extubated, off pressors 2/16 d/c amiodarone  TESTS: 2/11 CT abd/pelvis >> pneumatosis SB with moderate ascites, infrarenal AAA, cirrhosis   SUBJECTIVE:  Feels thirsty.  Mild soreness in abdomen. Denies chest pain, dyspnea.  VITAL SIGNS: Temp:  [98.3 F (36.8 C)-99.8 F (37.7 C)] 98.5 F (36.9 C) (02/16 0700) Pulse Rate:  [68-249] 77 (02/16 0600) Resp:  [13-24] 22 (02/16 0700) BP: (94-156)/(24-112) 128/50 mmHg (02/16 0700) SpO2:  [92 %-98 %] 96 % (02/16 0700) Arterial Line BP: (95-203)/(38-65) 125/45 mmHg (02/16 0700) FiO2 (%):  [40 %] 40 % (02/15 1200) Weight:  [252 lb 10.4 oz (114.6 kg)] 252 lb 10.4 oz (114.6 kg) (02/16 0356) INTAKE / OUTPUT:  Intake/Output Summary (Last 24 hours) at 12/21/14 0844 Last data filed at 12/21/14 0700  Gross per 24 hour  Intake 3761.96 ml  Output   2480 ml  Net 1281.96 ml    PHYSICAL EXAMINATION: General: no  distress Neuro: normal strength HEENT: NG tube in place Cardiovascular: regular Lungs: scattered rhonchi Abdomen:  Soft, wound vac in place Ext: no edema  LABS:  CBC  Recent Labs Lab 12/19/14 0839 12/20/14 0340 12/21/14 0355  WBC 11.5* 11.7* 9.0  HGB 9.8* 9.5* 9.7*  HCT 28.3* 27.9* 27.6*  PLT 91* 82* 82*   Coag's  Recent Labs Lab 08-Aug-2015 1325 01/02/2015 0405 12/14/2014 1306  INR 1.53* 2.02* 1.67*   BMET  Recent Labs Lab 12/19/14 0430 12/20/14 0340 12/21/14 0355  NA 132* 133* 133*  K 4.3 4.2 3.7  CL 107 104 104  CO2 22 26 21   BUN 37* 40* 39*  CREATININE 1.34 1.29 1.10  GLUCOSE 152* 129* 135*   Electrolytes  Recent Labs Lab 12/11/2014 0405 12/19/14 0430 12/20/14 0340 12/21/14 0355  CALCIUM 7.5* 7.4* 7.3* 7.8*  MG 1.7 2.2 2.4  --   PHOS 2.4 2.8 3.5  --    Sepsis Markers  Recent Labs Lab 08-Aug-2015 1325  12/17/14 2219 12/10/2014 0057 12/30/2014 1306  LATICACIDVEN  --   < > 1.6 1.4 1.3  PROCALCITON 4.90  --   --   --   --   < > = values in this interval not displayed.   ABG  Recent Labs Lab 08-Aug-2015 1917 12/17/14 1602 12/19/14 1213  PHART 7.196* 7.357 7.258*  PCO2ART 41.5 35.9 49.2*  PO2ART 79.0* 100.0 80.0   Liver Enzymes  Recent Labs Lab 08-Aug-2015 1325 12/11/2014 0405 12/20/14 0340  AST 74* 52* 50*  ALT 26 21 18   ALKPHOS 110 39  63  BILITOT 2.9* 1.8* 2.4*  ALBUMIN 2.6* 2.5* 2.2*   Cardiac Enzymes  Recent Labs Lab 12/10/2014 1306 12/06/2014 1815 12/19/14 0015  TROPONINI <0.03 <0.03 0.03   Glucose  Recent Labs Lab 12/20/14 0343 12/20/14 0720 12/20/14 1522 12/20/14 1932 12/20/14 2320 12/21/14 0328  GLUCAP 110* 124* 131* 133* 142* 129*     ASSESSMENT / PLAN:  PULMONARY ETT 2/11 >> 2/15 A: Acute respiratory failure in setting of ischemic bowel. Hx of OSA. P:   Oxygen to keep SaO2 > 92% Bronchial hygiene  CARDIOVASCULAR L IJ CVL 2/11 >>  A line 2/11 > 2/16 A: Shock - likely hypovolemic and septic >> off pressors  2/15. PAF with RVR -- converted to NSR 2/13. AAA - nondissecting. Hx of CAD s/p CABG, HTN, HLD. P:  PRN lopressor IV D/c amiodarone 2/16 Even fluid balance Hold outpt lasix, lopressor, zocor for now   RENAL A:   AKI - resolved. Lactic acidosis - resolved. P:   Monitor renal fx, urine outpt, electrolytes  GASTROINTESTINAL A:   Post laparotomy with bowel resection, repeat OR for fascial closure 2/13. Post op ileus Hx of alcoholic/NASH cirrhosis >> followed at [facility]. Diarrhea >> C diff negative 2/16. P:   SUP: IV pantoprazole TPN, wound care per surgery  Hold outpt aldactone, chronulac, xifaxan for now  HEMATOLOGIC A:   Anemia, thrombocytopenia of critical illness and chronic disease. P:  SQ heparin for DVT prevention F/u CBC  INFECTIOUS A:   Septic shock 2nd to peritonitis. P:   Day 6/7 of zosyn  ENDOCRINE A:   Stress induced hyperglycemia P:   Continue SSI while on TPN  NEUROLOGIC A:   Hx of depression. Post-op pain control. Deconditioning. P:   PRN fentanyl Hold outpt zoloft OOB, PT assessment  Will transfer to SDU.  Will ask [facility] to assume care 2/17 and PCCM sign off.  Coralyn Helling, MD [facility] Pulmonary/Critical Care 12/21/2014, 8:56 AM Pager:  [phone] After 3pm call: [phone]

## 2014-12-21 NOTE — Progress Notes (Signed)
Patient ID: Kyle West, male   DOB: 09/01/1951, 64 y.o.   MRN: [id number] 3 Days Post-Op  Subjective: Pt is confused, but no major complaints.  Had a large diarrhea stool overnight, but NGT output has gone up some.  Objective: Vital signs in last 24 hours: Temp:  [98.3 F (36.8 C)-99.8 F (37.7 C)] 98.5 F (36.9 C) (02/16 0700) Pulse Rate:  [68-249] 92 (02/16 0900) Resp:  [14-24] 23 (02/16 0900) BP: (94-156)/(24-112) 136/47 mmHg (02/16 0900) SpO2:  [92 %-98 %] 93 % (02/16 0900) Arterial Line BP: (103-203)/(38-65) 129/45 mmHg (02/16 0800) FiO2 (%):  [40 %] 40 % (02/15 1200) Weight:  [252 lb 10.4 oz (114.6 kg)] 252 lb 10.4 oz (114.6 kg) (02/16 0356) Last BM Date: 12/21/14  Intake/Output from previous day: 02/15 0701 - 02/16 0700 In: 3998.7 [I.V.:1028.7; NG/GT:180; IV Piggyback:150; TPN:2640] Out: 2645 [Urine:1695; Emesis/NG output:900; Drains:50] Intake/Output this shift: Total I/O In: 179.2 [I.V.:26.7; NG/GT:30; IV Piggyback:12.5; TPN:110] Out: 160 [Urine:60; Emesis/NG output:100]  PE: Abd: soft, appropriately tender, VAC just changed and RN reports it looked healthy and clean, NGT with some light bilious output.  Some BS  Lab Results:   Recent Labs  12/20/14 0340 12/21/14 0355  WBC 11.7* 9.0  HGB 9.5* 9.7*  HCT 27.9* 27.6*  PLT 82* 82*   BMET  Recent Labs  12/20/14 0340 12/21/14 0355  NA 133* 133*  K 4.2 3.7  CL 104 104  CO2 26 21  GLUCOSE 129* 135*  BUN 40* 39*  CREATININE 1.29 1.10  CALCIUM 7.3* 7.8*   PT/INR  Recent Labs  10-18-15 1306  LABPROT 19.9*  INR 1.67*   CMP     Component Value Date/Time   NA 133* 12/21/2014 0355   K 3.7 12/21/2014 0355   CL 104 12/21/2014 0355   CO2 21 12/21/2014 0355   GLUCOSE 135* 12/21/2014 0355   BUN 39* 12/21/2014 0355   CREATININE 1.10 12/21/2014 0355   CALCIUM 7.8* 12/21/2014 0355   PROT 4.4* 12/20/2014 0340   ALBUMIN 2.2* 12/20/2014 0340   AST 50* 12/20/2014 0340   ALT 18 12/20/2014 0340   ALKPHOS 63 12/20/2014 0340   BILITOT 2.4* 12/20/2014 0340   GFRNONAA 69* 12/21/2014 0355   GFRAA 80* 12/21/2014 0355   Lipase     Component Value Date/Time   LIPASE 51 12/14/2014 1325       Studies/Results: No results found.  Anti-infectives: Anti-infectives    Start     Dose/Rate Route Frequency Ordered Stop   12/08/2014 1430  piperacillin-tazobactam (ZOSYN) IVPB 3.375 g     3.375 g 12.5 mL/hr over 240 Minutes Intravenous 3 times per day 01/02/2015 1403         Assessment/Plan   1. POD 5/3, s/p ex lap with open abdomen and SBR, s/p relook and closure 2. Cirrhosis 3. VDRF 4. Septic shock secondary to ischemia bowel, still on neo 5. Post op ileus 6. SPCN/TNA 7. Thrombocytopenia  Plan: 1. Will clamp NGT today for 4 hours and check a residual.  If low, will plan to DC NGT since he is developing a pressure sore from his tube.  If he develops nausea or has a high residual, we will need to leave it in and return to suction. 2. Agree with tx to SDU and PT evaluation for mobilization 3. Cont abx therapy 4. Cont TNA for nutritional support until he is eating.  LOS: 5 days    Sukari Grist E 12/21/2014, 10:36 AM Pager: [phone]

## 2014-12-21 NOTE — Progress Notes (Signed)
eLink Physician-Brief Progress Note Patient Name: Kyle West DOB: Aug 17, 1951 MRN: [id number]   Date of Service  12/21/2014  HPI/Events of Note  Pt now on sips and chips and fm requesting repeat ammonia level and get started back on lactulose  eICU Interventions  Restart lactulose as long as tol sips and chips well / check random ammonia level     Intervention Category Minor Interventions: Routine modifications to care plan (e.g. PRN medications for pain, fever)  Sandrea HughsMichael Wert 12/21/2014, 5:08 PM

## 2014-12-21 NOTE — Progress Notes (Signed)
PARENTERAL NUTRITION CONSULT NOTE - FOLLOW UP  Pharmacy Consult for TPN Indication: Prolonged ileus  Allergies  Allergen Reactions  . Oxycodone Hives and Other (See Comments)    Hallucinations    Patient Measurements: Height:  (185.4 cm) Weight: 252 lb 10.4 oz (114.6 kg) IBW/kg (Calculated) : 79.9 Adjusted Body Weight:  Usual Weight:   Vital Signs: Temp: 98.5 F (36.9 C) (02/16 0700) Temp Source: Axillary (02/16 0700) BP: 119/55 mmHg (02/16 1000) Pulse Rate: 88 (02/16 1000) Intake/Output from previous day: 02/15 0701 - 02/16 0700 In: 3998.7 [I.V.:1028.7; NG/GT:180; IV Piggyback:150; TPN:2640] Out: 2645 [Urine:1695; Emesis/NG output:900; Drains:50] Intake/Output from this shift: Total I/O In: 435.9 [I.V.:63.4; NG/GT:30; IV Piggyback:12.5; TPN:330] Out: 225 [Urine:100; Emesis/NG output:100; Drains:25]  Labs:  Recent Labs  12/17/2014 1306 12/19/14 0839 12/20/14 0340 12/21/14 0355  WBC 5.5 11.5* 11.7* 9.0  HGB 10.2* 9.8* 9.5* 9.7*  HCT 30.1* 28.3* 27.9* 27.6*  PLT 74* 91* 82* 82*  INR 1.67*  --   --   --      Recent Labs  12/19/14 0430 12/20/14 0340 12/21/14 0355  NA 132* 133* 133*  K 4.3 4.2 3.7  CL 107 104 104  CO2 GLUCOSE 152* 129* 135*  BUN 37* 40* 39*  CREATININE 1.34 1.29 1.10  CALCIUM 7.4* 7.3* 7.8*  MG 2.2 2.4  --   PHOS 2.8 3.5  --   PROT  --  4.4*  --   ALBUMIN  --  2.2*  --   AST  --  50*  --   ALT  --  18  --   ALKPHOS  --  63  --   BILITOT  --  2.4*  --   PREALBUMIN  --  5.1*  --   TRIG  --  54  --    Estimated Creatinine Clearance: 90 mL/min (by C-G formula based on Cr of 1.1).    Recent Labs  12/20/14 2320 12/21/14 0328 12/21/14 0734  GLUCAP 142* 129* 169*    Medications:  Scheduled:  . chlorhexidine  15 mL Mouth Rinse BID  . heparin subcutaneous  5,000 Units Subcutaneous 3 times per day  . insulin aspart  0-9 Units Subcutaneous 6 times per day  . metoprolol  2.5 mg Intravenous 4 times per day  .  pantoprazole (PROTONIX) IV  40 mg Intravenous Q24H  . piperacillin-tazobactam (ZOSYN)  IV  3.375 g Intravenous 3 times per day    Insulin Requirements in the past 24 hours:  7 units sensitive SSI, 15 units insulin in TPN  Current Nutrition:  NPO Clinimix-E 5/15 at 135ml/hr and Lipids at 22ml/hr, providing 120g protein and 2297Kcal. This meets >100% of Kcal needs and 72% of protein needs (based on vent goals).  Admit:  64 year old man transferred from outside hospital for concern of ischemic bowel. He underwent surgery on 2/11 - exploratory laparotomy, small bowel resection and application of a wound vac. TPN to start in anticipation of prolonged bowel rest/ileus.   Nutritional Goals: (per RD note on 2/12 - Thanks!) 1515-1840 kcal, >= 167g protein while on vent  2200-2400 kcal, 120-135g of protein per day when off vent  GI: S/p ex. Lap with SBR on 2/11. Also history of pancreatitis and cirrhosis child pugh B- PTA: Lactulose, PPI, Rifaximin, spironolactione, Ursodiol. S/P side-to-side small bowel anastomosis and closure of abdomen on 2/13. Abd soft.  NG output 900/24hr and 50mL from drain. Diarrhea- CDiff(-).  PPI-IV  Endo: No history  of diabetes. CBGs < 150, except 169 x 1.  Lytes: K 3.7, Na 133  Renal: AKI- resolved. Scr 1.1 today. UOP 0.6 mL/kg/hr  Pulm:  [place]-3L.   Cards: History of CAD on simvastatin and metoprolol pta. Hypotensive, HR wnl. Metoprolol IV  Hepatobil: history of cirrhosis/NASH. AST 50 , ALT wnl, Tbili 2.4, INR 1.67 (2/13). Baseline palb low at 6.4 & TG 54  Neuro: Zoloft PTA for depression. Fentanyl drip- to be d/c'd 2/15, Fent prn  ID: Zosyn x 7days for intra-abdominal coverage (2/11>>). Afebrile, WBC wnl, CDiff(-) 2/16.  Best Practices: SCDs, mouthcare, Hep SQ  TPN Access: Triple Lumen IJ (placed 2/11) TPN day#: 12/17/14 >>  Plan:  Inc Clinimix E 5/15 to 12510mL/hr with lipids at 3810mL/hr, to achieve goals off vent.  This will meet  100% protein and caloric needs.  Continue SSI Add 15 units insulin in TPN  Marisue HumbleKendra Alto Gandolfo, PharmD Clinical Pharmacist [place] [facility]

## 2014-12-22 DIAGNOSIS — J9600 Acute respiratory failure, unspecified whether with hypoxia or hypercapnia: Secondary | ICD-10-CM

## 2014-12-22 DIAGNOSIS — A419 Sepsis, unspecified organism: Secondary | ICD-10-CM

## 2014-12-22 DIAGNOSIS — N179 Acute kidney failure, unspecified: Secondary | ICD-10-CM

## 2014-12-22 LAB — CBC
HEMATOCRIT: 29.1 % — AB (ref 39.0–52.0)
HEMOGLOBIN: 10.2 g/dL — AB (ref 13.0–17.0)
MCH: 33.9 pg (ref 26.0–34.0)
MCHC: 35.1 g/dL (ref 30.0–36.0)
MCV: 96.7 fL (ref 78.0–100.0)
Platelets: 90 10*3/uL — ABNORMAL LOW (ref 150–400)
RBC: 3.01 MIL/uL — ABNORMAL LOW (ref 4.22–5.81)
RDW: 16.9 % — AB (ref 11.5–15.5)
WBC: 10.3 10*3/uL (ref 4.0–10.5)

## 2014-12-22 LAB — BASIC METABOLIC PANEL
ANION GAP: 4 — AB (ref 5–15)
BUN: 38 mg/dL — AB (ref 6–23)
CHLORIDE: 106 mmol/L (ref 96–112)
CO2: 25 mmol/L (ref 19–32)
CREATININE: 1.13 mg/dL (ref 0.50–1.35)
Calcium: 8.1 mg/dL — ABNORMAL LOW (ref 8.4–10.5)
GFR calc Af Amer: 78 mL/min — ABNORMAL LOW (ref >90)
GFR calc non Af Amer: 67 mL/min — ABNORMAL LOW (ref >90)
Glucose, Bld: 101 mg/dL — ABNORMAL HIGH (ref 70–99)
POTASSIUM: 3.7 mmol/L (ref 3.5–5.1)
Sodium: 135 mmol/L (ref 135–145)

## 2014-12-22 LAB — GLUCOSE, CAPILLARY
GLUCOSE-CAPILLARY: 98 mg/dL (ref 70–99)
Glucose-Capillary: 144 mg/dL — ABNORMAL HIGH (ref 70–99)
Glucose-Capillary: 153 mg/dL — ABNORMAL HIGH (ref 70–99)
Glucose-Capillary: 144 mg/dL — ABNORMAL HIGH (ref 70–99)
Glucose-Capillary: 116 mg/dL — ABNORMAL HIGH (ref 70–99)
Glucose-Capillary: 138 mg/dL — ABNORMAL HIGH (ref 70–99)

## 2014-12-22 MED ORDER — TRACE MINERALS CR-CU-F-FE-I-MN-MO-SE-ZN IV SOLN
INTRAVENOUS | Status: DC
  Administered 2014-12-22: 18:00:00 via INTRAVENOUS
  Filled 2014-12-22: qty 2640

## 2014-12-22 MED ORDER — FAT EMULSION 20 % IV EMUL
240.0000 mL | INTRAVENOUS | Status: DC
  Administered 2014-12-22: 240 mL via INTRAVENOUS
  Filled 2014-12-22: qty 250

## 2014-12-22 NOTE — Progress Notes (Signed)
PARENTERAL NUTRITION CONSULT NOTE - FOLLOW UP  Pharmacy Consult for TPN Indication: Prolonged ileus  Allergies  Allergen Reactions  . Oxycodone Hives and Other (See Comments)    Hallucinations    Patient Measurements: Height:  (185.4 cm) Weight: 250 lb 14.1 oz (113.8 kg) IBW/kg (Calculated) : 79.9 Adjusted Body Weight:  Usual Weight:   Vital Signs: Temp: 98.3 F (36.8 C) (02/17 0319) Temp Source: Oral (02/17 0319) BP: 145/53 mmHg (02/17 0319) Pulse Rate: 95 (02/17 0319) Intake/Output from previous day: 02/16 0701 - 02/17 0700 In: 1035.9 [I.V.:113.4; NG/GT:30; IV Piggyback:12.5; TPN:880] Out: 877 [Urine:702; Emesis/NG output:100; Drains:75] Intake/Output from this shift:    Labs:  Recent Labs  12/20/14 0340 12/21/14 0355 12/22/14 0540  WBC 11.7* 9.0 10.3  HGB 9.5* 9.7* 10.2*  HCT 27.9* 27.6* 29.1*  PLT 82* 82* 90*     Recent Labs  12/20/14 0340 12/21/14 0355 12/22/14 0540  NA 133* 133* 135  K 4.2 3.7 3.7  CL 104 104 106  CO2 GLUCOSE 129* 135* 101*  BUN 40* 39* 38*  CREATININE 1.29 1.10 1.13  CALCIUM 7.3* 7.8* 8.1*  MG 2.4  --   --   PHOS 3.5  --   --   PROT 4.4*  --   --   ALBUMIN 2.2*  --   --   AST 50*  --   --   ALT 18  --   --   ALKPHOS 63  --   --   BILITOT 2.4*  --   --   PREALBUMIN 5.1*  --   --   TRIG 54  --   --    Estimated Creatinine Clearance: 87.3 mL/min (by C-G formula based on Cr of 1.13).    Recent Labs  12/21/14 2314 12/22/14 0320 12/22/14 0809  GLUCAP 135* 144* 153*    Medications:  Scheduled:  . chlorhexidine  15 mL Mouth Rinse BID  . heparin subcutaneous  5,000 Units Subcutaneous 3 times per day  . insulin aspart  0-9 Units Subcutaneous 6 times per day  . lactulose  20 g Oral BID  . metoprolol  2.5 mg Intravenous 4 times per day  . pantoprazole (PROTONIX) IV  40 mg Intravenous Q24H  . piperacillin-tazobactam (ZOSYN)  IV  3.375 g Intravenous 3 times per day    Insulin Requirements in the past  24 hours:  6 units sensitive SSI, 15 units insulin in TPN  Current Nutrition:  NPO Clinimix-E 5/15 at 146ml/hr and Lipids at 55ml/hr, providing 120g protein and 2297Kcal and meeting 100% of his goals  Admit:  64 year old man transferred from outside hospital for concern of ischemic bowel. He underwent surgery on 2/11 - exploratory laparotomy, small bowel resection and application of a wound vac. TPN to start in anticipation of prolonged bowel rest/ileus.   Nutritional Goals: (per RD note on 2/12 - Thanks!) 1515-1840 kcal, >= 167g protein while on vent  2200-2400 kcal, 120-135g of protein per day when off vent  GI:  S/P ex. Lap with SBR on 2/11. Also history of pancreatitis and cirrhosis child pugh B- PTA: Lactulose, PPI, Rifaximin, spironolactione, Ursodiol. S/P side-to-side small bowel anastomosis and closure of abdomen on 2/13. Abd soft. NG d/c'd- no vomiting. Continued diarrhea, Lactulose resumed for liver dz. CL diet started.  PPI-IV, Lactulose  Endo: No history of diabetes. CBGs < 150, except 153 x 1  Lytes: K 3.7, Na 135  Renal: AKI- resolved. Scr 1.1 today.  UOP 0.3 mL/kg/hr  Pulm: [place]-3L >> RA.   Cards: History of CAD on simvastatin and metoprolol pta. BP soft, HR wnl. Metoprolol IV  Hepatobil: history of cirrhosis/NASH. AST 50 , ALT wnl, Tbili 2.4, INR 1.67 (2/13). Baseline palb low at 6.4 & TG 54.  Ammonia 35 (2/16).  Lactulose resumed  Neuro: Zoloft PTA for depression. Fentanyl drip- to be d/c'd 2/15, Fent prn  ID: Zosyn x 7days for intra-abdominal coverage (2/11>>). Afebrile, WBC wnl, CDiff(-) 2/16.  Best Practices: SCDs, mouthcare, Hep SQ  TPN Access: Triple Lumen IJ (placed 2/11) TPN day#: 12/17/14 >>  Plan:  Clinimix E 5/15- 12910mL/hr with lipids at 5110mL/hr Continue SSI Add 15 units insulin in TPN F/U diet advancement, wean TPN as appropriate TPN labs in AM  Marisue HumbleKendra Elaina Cara, PharmD Clinical Pharmacist [place] [facility]

## 2014-12-22 NOTE — Progress Notes (Signed)
Patient ID: Kyle West, male   DOB: 28-Jan-1951, 64 y.o.   MRN: [id number] 4 Days Post-Op  Subjective: Pt has had several BMs.  No nausea with NGT out.  Oriented x3 today.  Objective: Vital signs in last 24 hours: Temp:  [97.9 F (36.6 C)-98.5 F (36.9 C)] 98.3 F (36.8 C) (02/17 0319) Pulse Rate:  [84-100] 95 (02/17 0319) Resp:  [17-24] 23 (02/17 0319) BP: (119-149)/(47-81) 145/53 mmHg (02/17 0319) SpO2:  [93 %-100 %] 100 % (02/17 0319) Arterial Line BP: (151)/(48) 151/48 mmHg (02/16 0900) Weight:  [250 lb 14.1 oz (113.8 kg)] 250 lb 14.1 oz (113.8 kg) (02/17 0400) Last BM Date: 12/21/14  Intake/Output from previous day: 02/16 0701 - 02/17 0700 In: 1035.9 [I.V.:113.4; NG/GT:30; IV Piggyback:12.5; TPN:880] Out: 877 [Urine:702; Emesis/NG output:100; Drains:75] Intake/Output this shift:    PE: Abd: soft, +BS, appropriately tender, wound VAC in place, ND  Lab Results:   Recent Labs  12/21/14 0355 12/22/14 0540  WBC 9.0 10.3  HGB 9.7* 10.2*  HCT 27.6* 29.1*  PLT 82* 90*   BMET  Recent Labs  12/21/14 0355 12/22/14 0540  NA 133* 135  K 3.7 3.7  CL 104 106  CO2 21 25  GLUCOSE 135* 101*  BUN 39* 38*  CREATININE 1.10 1.13  CALCIUM 7.8* 8.1*   PT/INR No results for input(s): LABPROT, INR in the last 72 hours. CMP     Component Value Date/Time   NA 135 12/22/2014 0540   K 3.7 12/22/2014 0540   CL 106 12/22/2014 0540   CO2 25 12/22/2014 0540   GLUCOSE 101* 12/22/2014 0540   BUN 38* 12/22/2014 0540   CREATININE 1.13 12/22/2014 0540   CALCIUM 8.1* 12/22/2014 0540   PROT 4.4* 12/20/2014 0340   ALBUMIN 2.2* 12/20/2014 0340   AST 50* 12/20/2014 0340   ALT 18 12/20/2014 0340   ALKPHOS 63 12/20/2014 0340   BILITOT 2.4* 12/20/2014 0340   GFRNONAA 67* 12/22/2014 0540   GFRAA 78* 12/22/2014 0540   Lipase     Component Value Date/Time   LIPASE 51 12/19/2014 1325       Studies/Results: No results found.  Anti-infectives: Anti-infectives    Start      Dose/Rate Route Frequency Ordered Stop   12/09/2014 1430  piperacillin-tazobactam (ZOSYN) IVPB 3.375 g     3.375 g 12.5 mL/hr over 240 Minutes Intravenous 3 times per day 12/12/2014 1403         Assessment/Plan  1. POD 6/4, s/p ex lap with open abdomen and SBR, s/p relook and closure 2. Cirrhosis 3. VDRF, extubated on 2/15 4. Septic shock secondary to ischemia bowel, improved 5. Post op ileus, resolving 6. SPCM/TNA 7. Thrombocytopenia  Plan: 1. Patient's confusion is much better today.  Ok to resume home dose of lactulose and any other oral medications.  Will place the patient on a clear liquid diet today. 2. Mobilize and pulm toilet 3. VAC changes, T,T,S 4. Zosyn D6/? (will discuss duration of abx therapy with MD) 5. Heparin/SCDs for prophylaxis  LOS: 6 days    Larry Knipp E 12/22/2014, 8:35 AM Pager: [phone]

## 2014-12-22 NOTE — Evaluation (Signed)
Physical Therapy Evaluation Patient Details Name: Kyle West MRN: [id number] DOB: 02/13/1951 Today's Date: 12/22/2014   History of Present Illness  pt presents with Ischemic Bowel and has had an ex lap with re-exploration and now with wound vac.  pt with hx of Cirrhosis.    Clinical Impression  Pt eager for mobility, but limited by weakness and deconditioning.  Pt with some cognitive deficits, but question if this will resolve as ammonia levels decrease.  At this time feel pt will benefit from CIR at D/C to maximize independence prior to returning to home.  Will continue to follow.      Follow Up Recommendations CIR    Equipment Recommendations  None recommended by PT    Recommendations for Other Services Rehab consult     Precautions / Restrictions Precautions Precautions: Fall Precaution Comments: Abdominal incisions and wound vac. Restrictions Weight Bearing Restrictions: No      Mobility  Bed Mobility Overal bed mobility: Needs Assistance Bed Mobility: Rolling;Sidelying to Sit Rolling: Min assist Sidelying to sit: Mod assist;HOB elevated       General bed mobility comments: cues for log roll technique and for staying on task.    Transfers Overall transfer level: Needs assistance Equipment used: 2 person hand held assist Transfers: Sit to/from [employer] Sit to Stand: Mod assist;+2 physical assistance Stand pivot transfers: Min assist;+2 physical assistance       General transfer comment: pt needs increased A fro power up to standing, but then in standing is able to pivot to recliner with MinA x2.  Max cueing for sequencing.  pt Bil LEs visibly shaking in standing.    Ambulation/Gait                Stairs            Wheelchair Mobility    Modified Rankin (Stroke Patients Only)       Balance Overall balance assessment: Needs assistance Sitting-balance support: No upper extremity supported;Feet supported Sitting  balance-Leahy Scale: Fair     Standing balance support: During functional activity Standing balance-Leahy Scale: Poor                               Pertinent Vitals/Pain Pain Assessment: No/denies pain    Home Living Family/patient expects to be discharged to:: Inpatient rehab Living Arrangements: Spouse/significant other                    Prior Function Level of Independence: Independent               Hand Dominance        Extremity/Trunk Assessment   Upper Extremity Assessment: Defer to OT evaluation           Lower Extremity Assessment: Generalized weakness      Cervical / Trunk Assessment: Kyphotic  Communication   Communication: No difficulties  Cognition Arousal/Alertness: Awake/alert Behavior During Therapy: Flat affect Overall Cognitive Status: Impaired/Different from baseline Area of Impairment: Orientation;Attention;Memory;Following commands;Safety/judgement;Awareness;Problem solving Orientation Level: Disoriented to;Time;Situation Current Attention Level: Selective Memory: Decreased short-term memory Following Commands: Follows one step commands with increased time;Follows multi-step commands inconsistently Safety/Judgement: Decreased awareness of safety;Decreased awareness of deficits Awareness: Emergent Problem Solving: Slow processing;Difficulty sequencing;Requires verbal cues;Requires tactile cues General Comments: pt just started back on meds for Amonia levels and suspect cognitive impairments 2/2 elevated Amonia.      General Comments      Exercises  Assessment/Plan    PT Assessment Patient needs continued PT services  PT Diagnosis Difficulty walking   PT Problem List Decreased strength;Decreased activity tolerance;Decreased balance;Decreased mobility;Decreased cognition;Decreased knowledge of use of DME;Decreased safety awareness;Obesity  PT Treatment Interventions DME instruction;Gait  training;Functional mobility training;Therapeutic activities;Therapeutic exercise;Balance training;Patient/family education;Cognitive remediation   PT Goals (Current goals can be found in the Care Plan section) Acute Rehab PT Goals Patient Stated Goal: Per wife to get back to normal.   PT Goal Formulation: With patient/family Time For Goal Achievement: 01/05/15 Potential to Achieve Goals: Good    Frequency Min 3X/week   Barriers to discharge        Co-evaluation               End of Session Equipment Utilized During Treatment: Gait belt Activity Tolerance: Patient limited by fatigue Patient left: in chair;with call bell/phone within reach;with family/visitor present Nurse Communication: Mobility status         Time: [id number] PT Time Calculation (min) (ACUTE ONLY): 23 min   Charges:   PT Evaluation $Initial PT Evaluation Tier I: 1 Procedure PT Treatments $Therapeutic Activity: 8-22 mins   PT G CodesSunny Schlein:        Abbagail Scaff F, [place] CarolinaPT [phone] 12/22/2014, 10:16 AM

## 2014-12-22 NOTE — Evaluation (Signed)
Speech Language Pathology Evaluation Patient Details Name: Gurley Climer MRN: [id number] DOB: 16-Oct-1951 Today's Date: 12/22/2014 Time: 8295-6213 SLP Time Calculation (min) (ACUTE ONLY): 27 min  Problem List:  Patient Active Problem List   Diagnosis Date Noted  . Acute respiratory failure 12/22/2014  . Sepsis 12/22/2014  . AKI (acute kidney injury) 12/22/2014  . 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 Abdominal pain 12/28/2014   Past Medical History:  Past Medical History  Diagnosis Date  . Cirrhosis   . CAD (coronary artery disease)   . Hypertension   . Pancreatitis   . Depression   . AAA (abdominal aortic aneurysm) without rupture    Past Surgical History:  Past Surgical History  Procedure Laterality Date  . Hernia repair    . Coronary artery bypass graft    . Laparotomy N/A 12/15/2014    Procedure: EXPLORATORY LAPAROTOMY;  Surgeon: Harriette Bouillon, MD;  Location: [facility] OR;  Service: General;  Laterality: N/A;  . Bowel resection N/A 12/06/2014    Procedure: SMALL BOWEL RESECTION;  Surgeon: Harriette Bouillon, MD;  Location: [facility] OR;  Service: General;  Laterality: N/A;  . Application of wound vac N/A 12/23/2014    Procedure: APPLICATION OF WOUND VAC;  Surgeon: Harriette Bouillon, MD;  Location: [facility] OR;  Service: General;  Laterality: N/A;  . Laparotomy N/A 12/16/2014    Procedure: EXPLORATORY LAPAROTOMY;  Surgeon: Harriette Bouillon, MD;  Location: [facility] OR;  Service: General;  Laterality: N/A;   HPI:  64yo male with hx HTN, CAD s/p CABG, cirrhosis of unclear etiology (followed at [facility]), reported recent hx pancreatitis. Presented to OSH 2/11 with 2 day hx worsening abd pain, nausea and vomiting. CT at outside hospital revealed pneumatosis, hepatic and portal venous gas and infrarenal AAA. He was tx to [facility] for further eval with concern for ischemic bowel. Underwent laparotomy  2/11; reexploration 2/13; VDRF 2/11-2/15; TNA.  Started on clears 2/16.  Post-op confusion, deconditioning.  PT  recommending CIR.     Assessment / Plan / Recommendation Clinical Impression  Pt presents with acute post-op cognitive deficits, c/b hallucinations (decreased today) and deficits in sustained/alternating attention, working memory and Social research officer, government.  Pt scored 14/30 on the MOCA.  Visual memory > auditory memory.  Pt demonstrates recognition of written errors, but has difficulty initiating self-correction.  Recommend trial SLP intervention to address cognition, educate family re: progress and best methods to facilitate orientation/recall.      SLP Assessment  Patient needs continued Speech Lanaguage Pathology Services    Follow Up Recommendations  Inpatient Rehab - Mrs. Edelen requesting rehab closer to home, near [place], if possible.    Frequency and Duration min 2x/week  2 weeks   Pertinent Vitals/Pain Pain Assessment: No/denies pain   SLP Goals  Potential to Achieve Goals (ACUTE ONLY): Good  SLP Evaluation Prior Functioning  Cognitive/Linguistic Baseline: Within functional limits   Cognition  Overall Cognitive Status: Impaired/Different from baseline Arousal/Alertness: Awake/alert Orientation Level: Oriented to person;Disoriented to place;Disoriented to time;Disoriented to situation Attention: Focused Focused Attention: Appears intact Memory: Impaired Memory Impairment: Storage deficit;Retrieval deficit Awareness: Impaired Awareness Impairment: Intellectual impairment Problem Solving: Impaired Problem Solving Impairment: Verbal basic;Functional basic Safety/Judgment: Impaired    Comprehension  Auditory Comprehension Overall Auditory Comprehension: Appears within functional limits for tasks assessed Visual Recognition/Discrimination Discrimination: Within Function Limits Reading Comprehension Reading Status: Not tested    Expression Expression Primary Mode of Expression: Verbal Verbal Expression Overall Verbal Expression: Appears within functional limits  for tasks assessed Written Expression Dominant Hand: Right  Written Expression: Within Functional Limits   Oral / Motor Oral Motor/Sensory Function Overall Oral Motor/Sensory Function: Appears within functional limits for tasks assessed Motor Speech Overall Motor Speech: Appears within functional limits for tasks assessed   Damascus Feldpausch L. Samson Fredericouture, [place]MA CCC/SLP Pager [phone]      Blenda MountsCouture, Joee Iovine Laurice 12/22/2014, 4:45 PM

## 2014-12-22 NOTE — Progress Notes (Signed)
Rehab Admissions Coordinator Note:  Patient was screened by Akil Hoos L for appropriateness for an Inpatient Acute Rehab Consult.  At this time, we are recommending Inpatient Rehab consult.  Trany Chernick L 12/22/2014, 10:33 AM  I can be reached at [phone].

## 2014-12-22 NOTE — Progress Notes (Signed)
PROGRESS NOTE  Kyle West WUJ:[id number] DOB: February 28, 1951 DOA: 12/24/2014 PCP: Charmaine DownsEugene C Kovalik, MD  Brief history 64yo male with hx HTN, CAD s/p CABG, cirrhosis of unclear etiology ([facility]), reported recent hx pancreatitis. Presented to OSH 2/11 with 2 day hx worsening abd pain, nausea and vomiting. CT at outside hospital revealed pneumatosis, hepatic and portal venous gas and infrarenal AAA. [facility] with concern for ischemic bowel.  The patient was started on intravenous Unasyn and vancomycin for sepsis and peritonitis secondary to ischemic bowel. Gen. surgery was consulted. The patient underwent a laparotomy on 12/24/2014 with small bowel resection and application of wound VAC(Dr Cornett). Postoperatively, the patient was left intubated. He developed hypotension requiring central venous catheter and vasopressors. He was weaned off of vasopressors and extubated on 12/20/2014. The patient's postoperative course was also complicated by atrial fibrillation with RVR. He was placed on amiodarone for short period of time. He has converted back to sinus rhythm. Assessment/Plan: Ischemic bowel/peritonitis -appreciate surgery followup -POD #6/4--s/p ex lap with open abdomen and SBR, s/p relook and closure -diet advancement per surgery -no vomiting with NG out -TPN per surgery Septic shock -Secondary to ischemic bowel/peritonitis -Remained stable off vasopressors -Weaned off vasopressors 12/20/2014 -Continue IV Zosyn Acute respiratory failure -Patient was intubated February 11>>> February 15 -presently stable on room air PAF with RVR -Presently in sinus -CHADS-VASc = 1 -weaned off amio drip on 2/16 -continue IV lopressor until able to reliably take po AKI -Secondary to sepsis and volume depletion -Resolved Hyperglycemia -Hemoglobin A1c -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  EXAM: PORTABLE CHEST - 1 VIEW  COMPARISON:  28-Aug-2015  FINDINGS: Endotracheal tube is in place with tip just above the level of the carina. Nasogastric tube is in place with tip overlying the level of the stomach. Patient has had median sternotomy and CABG. Left IJ central line tip overlies the superior vena cava.  The heart is enlarged. There is dense opacity of the left lung base consistent with atelectasis or infiltrate. Small bilateral pleural effusions are identified. There has been some improvement in aeration of the right lung base.  IMPRESSION: 1. Postoperative lines and tubes. 2. Slightly improved aeration of the right lung base. 3. Persistent left lung base atelectasis and effusion.   Electronically Signed   By: Norva PavlovElizabeth  Brown M.D.   On: 12/19/2014 08:04   Dg Chest Port 1 View  24-Sep-2015   CLINICAL DATA:  Hypoxia/respiratory failure  EXAM: PORTABLE CHEST - 1 VIEW  COMPARISON:  December 17, 2014  FINDINGS: Endotracheal tube tip is 2.2 cm above the carina. Nasogastric tube tip and side port are in the stomach. Central catheter tip is in the superior vena cava. No pneumothorax. There is left lower lobe consolidation with small left effusion. There is a small right effusion with right base atelectasis. Heart is mildly enlarged. Patient is status post coronary artery bypass grafting. No adenopathy.  IMPRESSION: Tube and catheter positions as described without pneumothorax. Areas of consolidation with effusions. Heart prominent but stable.   Electronically Signed   By: Bretta BangWilliam  Woodruff III M.D.   On: 28-Aug-2015 14:48  Dg Chest Port 1 View  12/17/2014   CLINICAL DATA:  Respiratory failure.  EXAM: PORTABLE CHEST - 1 VIEW  COMPARISON:  30-Dec-2014.   FINDINGS: Stable cardiomediastinal silhouette. Endotracheal and nasogastric tubes are unchanged in position. Left internal jugular catheter is also noted with distal tip overlying expected position of the SVC. Status post coronary artery bypass graft. Bibasilar opacities are noted most consistent with subsegmental atelectasis. No pneumothorax is noted. No significant pleural effusion is noted.  IMPRESSION: Stable support apparatus. Stable bibasilar subsegmental atelectasis.   Electronically Signed   By: Lupita Raider, M.D.   On: 12/17/2014 07:53   Dg Chest Port 1 View  December 30, 2014   CLINICAL DATA:  Status post central line placement  EXAM: PORTABLE CHEST - 1 VIEW  COMPARISON:  Chest film from earlier in the same day  FINDINGS: Postsurgical changes are again seen. An endotracheal tube is now noted 5.2 cm above the carina. A nasogastric catheter is seen extending into the stomach. A left jugular central line is noted with the tip in the mid superior vena cava. No pneumothorax is seen. Bibasilar atelectatic changes are again noted.  IMPRESSION: Tubes and lines as described above. No pneumothorax is noted. Stable bibasilar atelectatic changes are seen.   Electronically Signed   By: Alcide Clever M.D.   On: 12/30/2014 19:11   Dg Chest Port 1 View  2014-12-30   CLINICAL DATA:  Abdominal aortic aneurysm. Shortness of breath. Coronary artery disease. Pre-op respiratory exam  EXAM: PORTABLE CHEST - 1 VIEW  COMPARISON:  None.  FINDINGS: Low lung volumes are seen. Streaky opacity lung bases is likely due to atelectasis, although pneumonia cannot definitely be excluded. No evidence of pleural effusion. Heart size is within normal limits allowing for low lung volumes. Previous CABG noted.  IMPRESSION: Low lung volumes with probable bibasilar atelectasis, although pneumonia cannot definitely be excluded.   Electronically Signed   By: Myles Rosenthal M.D.   On: Dec 30, 2014 13:16         Subjective: Patient complains of  some abdominal pain. Denies any chest pain, shortness breath, nausea, vomiting. He is passing flatus. Had some loose bowel movements. Denies fevers, chills, coughing, hemoptysis.  Objective: Filed Vitals:   12/21/14 2032 12/21/14 2307 12/22/14 0319 12/22/14 0400  BP:  135/58 145/53   Pulse:  93 95   Temp: 97.9 F (36.6 C) 98.5 F (36.9 C) 98.3 F (36.8 C)   TempSrc: Oral Oral Oral   Resp:  19 23   Height:      Weight:    113.8 kg (250 lb 14.1 oz)  SpO2:  100% 100%     Intake/Output Summary (Last 24 hours) at 12/22/14 0757 Last data filed at 12/22/14 0700  Gross per 24 hour  Intake 1035.9 ml  Output    877 ml  Net  158.9 ml   Weight change: -0.8 kg (-1 lb 12.2 oz) Exam:   General:  Pt is alert, follows commands appropriately, not in acute distress  HEENT: No icterus, No thrush,  [place]/AT  Cardiovascular: RRR, S1/S2, no rubs, no gallops  Respiratory: CTA bilaterally, no wheezing, no crackles, no rhonchi  Abdomen: Soft/+BS, non tender, non distended, no guarding  Extremities: trace LE edema, No lymphangitis, No petechiae, No rashes, no synovitis  Data Reviewed: Basic Metabolic Panel:  Recent Labs Lab 12/26/2014 0405 12/19/14 0430 12/20/14 0340 12/21/14 0355 12/22/14 0540  NA 134* 132* 133* 133* 135  K 4.3 4.3 4.2 3.7 3.7  CL 105 107 104 104 106  CO2 GLUCOSE 127* 152* 129* 135* 101*  BUN 30* 37* 40* 39* 38*  CREATININE 1.06 1.34 1.29 1.10 1.13  CALCIUM 7.5* 7.4* 7.3* 7.8* 8.1*  MG 1.7 2.2 2.4  --   --   PHOS 2.4 2.8 3.5  --   --    Liver Function Tests:  Recent Labs Lab 01-01-15 1325 12/29/2014 0405 12/20/14 0340  AST 74* 52* 50*  ALT ALKPHOS 110 39 63  BILITOT 2.9* 1.8* 2.4*  PROT 5.5* 4.2* 4.4*  ALBUMIN 2.6* 2.5* 2.2*    Recent Labs Lab 01-Jan-2015 1325  LIPASE 51  AMYLASE 112*    Recent Labs Lab 12/21/14 1919  AMMONIA 35*   CBC:  Recent Labs Lab 12/27/2014 0405 12/27/2014 1306 12/19/14 0839 12/20/14 0340  12/21/14 0355 12/22/14 0540  WBC 8.6 5.5 11.5* 11.7* 9.0 10.3  NEUTROABS 5.6  --   --  8.2*  --   --   HGB 8.8* 10.2* 9.8* 9.5* 9.7* 10.2*  HCT 26.2* 30.1* 28.3* 27.9* 27.6* 29.1*  MCV 106.9* 102.4* 100.0 101.8* 97.5 96.7  PLT 80* 74* 91* 82* 82* 90*   Cardiac Enzymes:  Recent Labs Lab 12/10/2014 1306 12/12/2014 1815 12/19/14 0015  TROPONINI <0.03 <0.03 0.03   BNP: Invalid input(s): POCBNP CBG:  Recent Labs Lab 12/21/14 1306 12/21/14 1540 12/21/14 2031 12/21/14 2314 12/22/14 0320  GLUCAP 140* 132* 143* 135* 144*    Recent Results (from the past 240 hour(s))  Surgical pcr screen     Status: None   Collection Time: 01-01-2015  3:13 PM  Result Value Ref Range Status   MRSA, PCR NEGATIVE NEGATIVE Final   Staphylococcus aureus NEGATIVE NEGATIVE Final    Comment:        The Xpert SA Assay (FDA approved for NASAL specimens in patients over 9 years of age), is one component of a comprehensive surveillance program.  Test performance has been validated by [facility] for patients greater than or equal to 59 year old. It is not intended to diagnose infection nor to guide or monitor treatment.   Clostridium Difficile by PCR     Status: None   Collection Time: 12/21/14  3:32 AM  Result Value Ref Range Status   C difficile by pcr NEGATIVE NEGATIVE Final     Scheduled Meds: . chlorhexidine  15 mL Mouth Rinse BID  . heparin subcutaneous  5,000 Units Subcutaneous 3 times per day  . insulin aspart  0-9 Units Subcutaneous 6 times per day  . lactulose  20 g Oral BID  . metoprolol  2.5 mg Intravenous 4 times per day  . pantoprazole (PROTONIX) IV  40 mg Intravenous Q24H  . piperacillin-tazobactam (ZOSYN)  IV  3.375 g Intravenous 3 times per day   Continuous Infusions: . Marland KitchenTPN (CLINIMIX-E) Adult 110 mL/hr at 12/21/14 1729   And  . fat emulsion 240 mL (12/21/14 1728)     Jasmain Ahlberg, DO  [facility] Pager [phone]  If 7PM-7AM, please contact  night-coverage [email or web address] Password [facility] 12/22/2014, 7:57 AM   LOS: 6 days

## 2014-12-23 ENCOUNTER — Encounter (HOSPITAL_COMMUNITY): Payer: Self-pay | Admitting: Surgery

## 2014-12-23 ENCOUNTER — Inpatient Hospital Stay (HOSPITAL_COMMUNITY): Payer: BLUE CROSS/BLUE SHIELD | Admitting: Anesthesiology

## 2014-12-23 ENCOUNTER — Encounter (HOSPITAL_COMMUNITY): Admission: EM | Disposition: E | Payer: Self-pay | Source: Other Acute Inpatient Hospital | Attending: Pulmonary Disease

## 2014-12-23 DIAGNOSIS — K659 Peritonitis, unspecified: Secondary | ICD-10-CM

## 2014-12-23 HISTORY — PX: LAPAROTOMY: SHX154

## 2014-12-23 LAB — CBC
HCT: 29.5 % — ABNORMAL LOW (ref 39.0–52.0)
Hemoglobin: 10.1 g/dL — ABNORMAL LOW (ref 13.0–17.0)
MCH: 33 pg (ref 26.0–34.0)
MCHC: 34.2 g/dL (ref 30.0–36.0)
MCV: 96.4 fL (ref 78.0–100.0)
PLATELETS: 102 10*3/uL — AB (ref 150–400)
RBC: 3.06 MIL/uL — AB (ref 4.22–5.81)
RDW: 16.7 % — ABNORMAL HIGH (ref 11.5–15.5)
WBC: 11 10*3/uL — ABNORMAL HIGH (ref 4.0–10.5)

## 2014-12-23 LAB — COMPREHENSIVE METABOLIC PANEL
ALT: 16 U/L (ref 0–53)
AST: 41 U/L — ABNORMAL HIGH (ref 0–37)
Albumin: 2.1 g/dL — ABNORMAL LOW (ref 3.5–5.2)
Alkaline Phosphatase: 89 U/L (ref 39–117)
Anion gap: 3 — ABNORMAL LOW (ref 5–15)
BUN: 38 mg/dL — ABNORMAL HIGH (ref 6–23)
CO2: 27 mmol/L (ref 19–32)
CREATININE: 1.04 mg/dL (ref 0.50–1.35)
Calcium: 7.8 mg/dL — ABNORMAL LOW (ref 8.4–10.5)
Chloride: 102 mmol/L (ref 96–112)
GFR calc Af Amer: 86 mL/min — ABNORMAL LOW (ref >90)
GFR, EST NON AFRICAN AMERICAN: 74 mL/min — AB (ref >90)
Glucose, Bld: 125 mg/dL — ABNORMAL HIGH (ref 70–99)
POTASSIUM: 3.4 mmol/L — AB (ref 3.5–5.1)
Sodium: 132 mmol/L — ABNORMAL LOW (ref 135–145)
Total Bilirubin: 3.2 mg/dL — ABNORMAL HIGH (ref 0.3–1.2)
Total Protein: 4.5 g/dL — ABNORMAL LOW (ref 6.0–8.3)

## 2014-12-23 LAB — GLUCOSE, CAPILLARY
GLUCOSE-CAPILLARY: 128 mg/dL — AB (ref 70–99)
GLUCOSE-CAPILLARY: 169 mg/dL — AB (ref 70–99)
Glucose-Capillary: 145 mg/dL — ABNORMAL HIGH (ref 70–99)
Glucose-Capillary: 161 mg/dL — ABNORMAL HIGH (ref 70–99)

## 2014-12-23 LAB — TYPE AND SCREEN
ABO/RH(D): O POS
Antibody Screen: NEGATIVE

## 2014-12-23 LAB — PHOSPHORUS: Phosphorus: 4.4 mg/dL (ref 2.3–4.6)

## 2014-12-23 LAB — MAGNESIUM: MAGNESIUM: 2.9 mg/dL — AB (ref 1.5–2.5)

## 2014-12-23 SURGERY — LAPAROTOMY, EXPLORATORY
Anesthesia: General | Site: Abdomen

## 2014-12-23 MED ORDER — MIDAZOLAM HCL 2 MG/2ML IJ SOLN
INTRAMUSCULAR | Status: AC
  Filled 2014-12-23: qty 2

## 2014-12-23 MED ORDER — ONDANSETRON HCL 4 MG/2ML IJ SOLN
INTRAMUSCULAR | Status: DC | PRN
  Administered 2014-12-23: 4 mg via INTRAVENOUS

## 2014-12-23 MED ORDER — GLYCOPYRROLATE 0.2 MG/ML IJ SOLN
INTRAMUSCULAR | Status: DC | PRN
  Administered 2014-12-23: 0.4 mg via INTRAVENOUS

## 2014-12-23 MED ORDER — METOPROLOL TARTRATE 1 MG/ML IV SOLN
2.5000 mg | Freq: Four times a day (QID) | INTRAVENOUS | Status: DC
  Administered 2014-12-23 – 2014-12-29 (×24): 2.5 mg via INTRAVENOUS
  Filled 2014-12-23 (×33): qty 5

## 2014-12-23 MED ORDER — METOPROLOL TARTRATE 25 MG PO TABS
25.0000 mg | ORAL_TABLET | Freq: Two times a day (BID) | ORAL | Status: DC
  Filled 2014-12-23: qty 1

## 2014-12-23 MED ORDER — FENTANYL CITRATE 0.05 MG/ML IJ SOLN
INTRAMUSCULAR | Status: DC | PRN
  Administered 2014-12-23 (×3): 50 ug via INTRAVENOUS
  Administered 2014-12-23: 100 ug via INTRAVENOUS

## 2014-12-23 MED ORDER — LACTATED RINGERS IV SOLN
INTRAVENOUS | Status: DC | PRN
  Administered 2014-12-23: 19:00:00 via INTRAVENOUS

## 2014-12-23 MED ORDER — ROCURONIUM BROMIDE 100 MG/10ML IV SOLN
INTRAVENOUS | Status: DC | PRN
  Administered 2014-12-23: 10 mg via INTRAVENOUS
  Administered 2014-12-23 (×2): 15 mg via INTRAVENOUS

## 2014-12-23 MED ORDER — ONDANSETRON HCL 4 MG/2ML IJ SOLN: 4.0000 mg | Freq: Once | INTRAMUSCULAR | Status: DC | PRN

## 2014-12-23 MED ORDER — PROPOFOL 10 MG/ML IV BOLUS
INTRAVENOUS | Status: DC | PRN
  Administered 2014-12-23: 130 mg via INTRAVENOUS

## 2014-12-23 MED ORDER — ARTIFICIAL TEARS OP OINT
TOPICAL_OINTMENT | OPHTHALMIC | Status: DC | PRN
  Administered 2014-12-23: 1 via OPHTHALMIC

## 2014-12-23 MED ORDER — LORAZEPAM 2 MG/ML IJ SOLN
0.5000 mg | Freq: Four times a day (QID) | INTRAMUSCULAR | Status: AC | PRN
  Administered 2014-12-23 – 2014-12-24 (×2): 0.5 mg via INTRAVENOUS
  Filled 2014-12-23 (×2): qty 1

## 2014-12-23 MED ORDER — MORPHINE SULFATE 2 MG/ML IJ SOLN
2.0000 mg | INTRAMUSCULAR | Status: DC | PRN
  Administered 2014-12-24 – 2014-12-26 (×5): 2 mg via INTRAVENOUS
  Filled 2014-12-23 (×5): qty 1

## 2014-12-23 MED ORDER — METOPROLOL TARTRATE 1 MG/ML IV SOLN
INTRAVENOUS | Status: AC
  Filled 2014-12-23: qty 5

## 2014-12-23 MED ORDER — FENTANYL CITRATE 0.05 MG/ML IJ SOLN
INTRAMUSCULAR | Status: AC
  Filled 2014-12-23: qty 5

## 2014-12-23 MED ORDER — POTASSIUM CHLORIDE 10 MEQ/50ML IV SOLN
10.0000 meq | INTRAVENOUS | Status: AC
  Administered 2014-12-23 (×2): 10 meq via INTRAVENOUS
  Filled 2014-12-23 (×2): qty 50

## 2014-12-23 MED ORDER — GLYCOPYRROLATE 0.2 MG/ML IJ SOLN
INTRAMUSCULAR | Status: AC
  Filled 2014-12-23: qty 2

## 2014-12-23 MED ORDER — FENTANYL CITRATE 0.05 MG/ML IJ SOLN
25.0000 ug | INTRAMUSCULAR | Status: DC | PRN
  Administered 2014-12-23: 50 ug via INTRAVENOUS

## 2014-12-23 MED ORDER — METOPROLOL TARTRATE 1 MG/ML IV SOLN
5.0000 mg | Freq: Once | INTRAVENOUS | Status: AC
  Administered 2014-12-23: 5 mg via INTRAVENOUS

## 2014-12-23 MED ORDER — INSULIN ASPART 100 UNIT/ML ~~LOC~~ SOLN
0.0000 [IU] | Freq: Three times a day (TID) | SUBCUTANEOUS | Status: DC
  Administered 2014-12-24: 1 [IU] via SUBCUTANEOUS
  Administered 2014-12-24: 2 [IU] via SUBCUTANEOUS
  Administered 2014-12-24 – 2014-12-25 (×3): 1 [IU] via SUBCUTANEOUS
  Administered 2014-12-25: 2 [IU] via SUBCUTANEOUS
  Administered 2014-12-26 (×2): 1 [IU] via SUBCUTANEOUS
  Administered 2014-12-26 – 2014-12-27 (×2): 2 [IU] via SUBCUTANEOUS
  Administered 2014-12-27 – 2014-12-28 (×4): 1 [IU] via SUBCUTANEOUS
  Administered 2014-12-29: 2 [IU] via SUBCUTANEOUS
  Administered 2014-12-29: 1 [IU] via SUBCUTANEOUS
  Administered 2014-12-29 – 2014-12-30 (×2): 2 [IU] via SUBCUTANEOUS
  Administered 2014-12-30: 1 [IU] via SUBCUTANEOUS
  Administered 2014-12-31 (×2): 2 [IU] via SUBCUTANEOUS

## 2014-12-23 MED ORDER — RIFAXIMIN 550 MG PO TABS
550.0000 mg | ORAL_TABLET | Freq: Two times a day (BID) | ORAL | Status: DC
  Filled 2014-12-23: qty 1

## 2014-12-23 MED ORDER — DILTIAZEM HCL 100 MG IV SOLR: 5.0000 mg/h | INTRAVENOUS | Status: DC

## 2014-12-23 MED ORDER — SODIUM CHLORIDE 0.9 % IV SOLN: 250.0000 mL | INTRAVENOUS | Status: DC | PRN

## 2014-12-23 MED ORDER — 0.9 % SODIUM CHLORIDE (POUR BTL) OPTIME
TOPICAL | Status: DC | PRN
  Administered 2014-12-23: 2000 mL

## 2014-12-23 MED ORDER — ONDANSETRON HCL 4 MG/2ML IJ SOLN
INTRAMUSCULAR | Status: AC
  Filled 2014-12-23: qty 2

## 2014-12-23 MED ORDER — 0.9 % SODIUM CHLORIDE (POUR BTL) OPTIME
TOPICAL | Status: DC | PRN
  Administered 2014-12-23 (×2): 1000 mL

## 2014-12-23 MED ORDER — PROPOFOL 10 MG/ML IV BOLUS
INTRAVENOUS | Status: AC
  Filled 2014-12-23: qty 20

## 2014-12-23 MED ORDER — LIDOCAINE HCL (CARDIAC) 20 MG/ML IV SOLN
INTRAVENOUS | Status: DC | PRN
  Administered 2014-12-23: 50 mg via INTRAVENOUS

## 2014-12-23 MED ORDER — FAT EMULSION 20 % IV EMUL
240.0000 mL | INTRAVENOUS | Status: AC
  Administered 2014-12-23 (×2): 240 mL via INTRAVENOUS
  Filled 2014-12-23: qty 250

## 2014-12-23 MED ORDER — NEOSTIGMINE METHYLSULFATE 10 MG/10ML IV SOLN
INTRAVENOUS | Status: DC | PRN
  Administered 2014-12-23: 4 mg via INTRAVENOUS

## 2014-12-23 MED ORDER — PANTOPRAZOLE SODIUM 40 MG PO TBEC: 40.0000 mg | DELAYED_RELEASE_TABLET | Freq: Every day | ORAL | Status: DC

## 2014-12-23 MED ORDER — METOPROLOL TARTRATE 1 MG/ML IV SOLN
INTRAVENOUS | Status: DC | PRN
  Administered 2014-12-23 (×3): 1 mg via INTRAVENOUS
  Administered 2014-12-23: 2 mg via INTRAVENOUS

## 2014-12-23 MED ORDER — FENTANYL CITRATE 0.05 MG/ML IJ SOLN
INTRAMUSCULAR | Status: AC
  Filled 2014-12-23: qty 2

## 2014-12-23 MED ORDER — M.V.I. ADULT IV INJ
INTRAVENOUS | Status: AC
  Administered 2014-12-23: 17:00:00 via INTRAVENOUS
  Filled 2014-12-23: qty 2640

## 2014-12-23 MED ORDER — SUCCINYLCHOLINE CHLORIDE 20 MG/ML IJ SOLN
INTRAMUSCULAR | Status: DC | PRN
  Administered 2014-12-23: 100 mg via INTRAVENOUS

## 2014-12-23 MED ORDER — NEOSTIGMINE METHYLSULFATE 10 MG/10ML IV SOLN
INTRAVENOUS | Status: AC
  Filled 2014-12-23: qty 1

## 2014-12-23 MED ORDER — URSODIOL 300 MG PO CAPS
300.0000 mg | ORAL_CAPSULE | Freq: Two times a day (BID) | ORAL | Status: DC
  Filled 2014-12-23: qty 1

## 2014-12-23 MED ORDER — DILTIAZEM HCL 100 MG IV SOLR
5.0000 mg/h | INTRAVENOUS | Status: DC
  Filled 2014-12-23: qty 100

## 2014-12-23 SURGICAL SUPPLY — 52 items
BLADE SURG ROTATE 9660 (MISCELLANEOUS) IMPLANT
BNDG GAUZE ELAST 4 BULKY (GAUZE/BANDAGES/DRESSINGS) ×4 IMPLANT
CANISTER SUCTION 2500CC (MISCELLANEOUS) ×2 IMPLANT
CATH ROBINSON RED A/P 16FR (CATHETERS) ×2 IMPLANT
CHLORAPREP W/TINT 26ML (MISCELLANEOUS) IMPLANT
COVER MAYO STAND STRL (DRAPES) IMPLANT
COVER SURGICAL LIGHT HANDLE (MISCELLANEOUS) ×2 IMPLANT
DRAPE LAPAROSCOPIC ABDOMINAL (DRAPES) ×2 IMPLANT
DRAPE PROXIMA HALF (DRAPES) IMPLANT
DRAPE UTILITY XL STRL (DRAPES) ×4 IMPLANT
DRAPE WARM FLUID 44X44 (DRAPE) IMPLANT
DRSG OPSITE POSTOP 4X10 (GAUZE/BANDAGES/DRESSINGS) IMPLANT
DRSG OPSITE POSTOP 4X8 (GAUZE/BANDAGES/DRESSINGS) IMPLANT
DRSG PAD ABDOMINAL 8X10 ST (GAUZE/BANDAGES/DRESSINGS) ×6 IMPLANT
ELECT BLADE 6.5 EXT (BLADE) IMPLANT
ELECT CAUTERY BLADE 6.4 (BLADE) ×2 IMPLANT
ELECT REM PT RETURN 9FT ADLT (ELECTROSURGICAL) ×2
ELECTRODE REM PT RTRN 9FT ADLT (ELECTROSURGICAL) ×1 IMPLANT
GLOVE BIO SURGEON STRL SZ8 (GLOVE) ×2 IMPLANT
GLOVE BIOGEL PI IND STRL 7.5 (GLOVE) ×1 IMPLANT
GLOVE BIOGEL PI IND STRL 8 (GLOVE) ×1 IMPLANT
GLOVE BIOGEL PI INDICATOR 7.5 (GLOVE) ×1
GLOVE BIOGEL PI INDICATOR 8 (GLOVE) ×1
GOWN STRL REUS W/ TWL LRG LVL3 (GOWN DISPOSABLE) ×2 IMPLANT
GOWN STRL REUS W/ TWL XL LVL3 (GOWN DISPOSABLE) IMPLANT
GOWN STRL REUS W/TWL LRG LVL3 (GOWN DISPOSABLE) ×2
GOWN STRL REUS W/TWL XL LVL3 (GOWN DISPOSABLE)
KIT BASIN OR (CUSTOM PROCEDURE TRAY) ×2 IMPLANT
KIT ROOM TURNOVER OR (KITS) ×2 IMPLANT
LIGASURE IMPACT 36 18CM CVD LR (INSTRUMENTS) IMPLANT
NS IRRIG 1000ML POUR BTL (IV SOLUTION) ×8 IMPLANT
PACK GENERAL/GYN (CUSTOM PROCEDURE TRAY) ×2 IMPLANT
PAD ARMBOARD 7.5X6 YLW CONV (MISCELLANEOUS) ×4 IMPLANT
PENCIL BUTTON HOLSTER BLD 10FT (ELECTRODE) IMPLANT
SPECIMEN JAR LARGE (MISCELLANEOUS) IMPLANT
SPONGE LAP 18X18 X RAY DECT (DISPOSABLE) IMPLANT
STAPLER VISISTAT 35W (STAPLE) IMPLANT
SUCTION POOLE TIP (SUCTIONS) ×2 IMPLANT
SUT ETHILON 1 TP 1 60 (SUTURE) ×20 IMPLANT
SUT ETHILON 2 0 PSLX (SUTURE) ×6 IMPLANT
SUT PDS AB 1 TP1 96 (SUTURE) ×4 IMPLANT
SUT VIC AB 2-0 SH 18 (SUTURE) IMPLANT
SUT VIC AB 2-0 SH 27 (SUTURE) ×1
SUT VIC AB 2-0 SH 27X BRD (SUTURE) ×1 IMPLANT
SUT VIC AB 3-0 SH 18 (SUTURE) IMPLANT
SUT VICRYL AB 2 0 TIES (SUTURE) IMPLANT
SUT VICRYL AB 3 0 TIES (SUTURE) IMPLANT
TOWEL OR 17X24 6PK STRL BLUE (TOWEL DISPOSABLE) ×2 IMPLANT
TOWEL OR 17X26 10 PK STRL BLUE (TOWEL DISPOSABLE) ×2 IMPLANT
TRAY FOLEY CATH 16FRSI W/METER (SET/KITS/TRAYS/PACK) IMPLANT
TUBE CONNECTING 12X1/4 (SUCTIONS) IMPLANT
YANKAUER SUCT BULB TIP NO VENT (SUCTIONS) IMPLANT

## 2014-12-23 NOTE — H&P (View-Only) (Signed)
Patient ID: Kyle West, male   DOB: [birth year], 64 y.o.   MRN: [id number] Notified by RN of bowel visible in [name]. On exam, has clear evisceration. [name] holding. Need to return to OR for ex lap and closure, likely retentions. I will D/W Dr. [name]. Procedure, risks, and benefits D/W his daughter and she agrees. [name], MD, MPH, FACS Trauma: [phone] General Surgery: [phone]

## 2014-12-23 NOTE — Anesthesia Procedure Notes (Signed)
Procedure Name: Intubation Date/Time: 2015/06/21 6:52 PM Performed by: Shaylan Tutton S Pre-anesthesia Checklist: Patient identified, Emergency Drugs available, Timeout performed, Suction available and Patient being monitored Patient Re-evaluated:Patient Re-evaluated prior to inductionOxygen Delivery Method: Circle system utilized Preoxygenation: Pre-oxygenation with 100% oxygen Intubation Type: IV induction, Rapid sequence and Cricoid Pressure applied Ventilation: Mask ventilation without difficulty Laryngoscope Size: Miller and 3 Grade View: Grade I Tube type: Oral Tube size: 7.5 mm Number of attempts: 1 Airway Equipment and Method: Stylet Placement Confirmation: ETT inserted through vocal cords under direct vision,  positive ETCO2 and breath sounds checked- equal and bilateral Secured at: 22 cm Tube secured with: Tape Dental Injury: Teeth and Oropharynx as per pre-operative assessment

## 2014-12-23 NOTE — Consult Note (Signed)
Reconsulted by surgery team for high output from midline surgical wound.  Pt with cirrhosis and large volume of ascites leaking from the NPWT VAC into the canister.  Bedside nurse today is also a Aeronautical engineerWOC nurse and we have discussed this patient.  Eakin pouch would be the best way to accurately measure the output from the wound and any other dressings will actually be overcome by the current amount of drainage.  No other recommendations for dressings at this time.  Bedside nurse (WOC) will place Eakin and place this to BSD bag in order to capture drainage and accurately account for volume of output.  Will follow along with you for support with Eakin pouch as needed.   Sachiko Methot [place] RN,CWOCN [phone]

## 2014-12-23 NOTE — Transfer of Care (Signed)
Immediate Anesthesia Transfer of Care Note  Patient: Kyle West  Procedure(s) Performed: Procedure(s): EXPLORATORY LAPAROTOMY WITH CLOSURE OF EVISCERATION (N/A)  Patient Location: PACU  Anesthesia Type:General  Level of Consciousness: awake and alert   Airway & Oxygen Therapy: Patient Spontanous Breathing and Patient connected to face mask oxygen  Post-op Assessment: Report given to RN and Post -op Vital signs reviewed and stable  Post vital signs: Reviewed and stable  Last Vitals:  Filed Vitals:   12/12/2014 1257  BP:   Pulse: 85  Temp:   Resp: 19    Complications: No apparent anesthesia complications

## 2014-12-23 NOTE — Anesthesia Preprocedure Evaluation (Signed)
Anesthesia Evaluation  Patient identified by MRN, date of birth, ID band Patient awake    Reviewed: Allergy & Precautions, NPO status , Patient's Chart, lab work & pertinent test results  Airway Mallampati: II  TM Distance: >3 FB Neck ROM: Full    Dental  (+) Teeth Intact, Dental Advisory Given   Pulmonary former smoker,  breath sounds clear to auscultation        Cardiovascular hypertension, Rhythm:Regular Rate:Normal     Neuro/Psych    GI/Hepatic   Endo/Other    Renal/GU      Musculoskeletal   Abdominal   Peds  Hematology   Anesthesia Other Findings   Reproductive/Obstetrics                             Anesthesia Physical Anesthesia Plan  ASA: III and emergent  Anesthesia Plan: General   Post-op Pain Management:    Induction: Intravenous  Airway Management Planned: Oral ETT  Additional Equipment:   Intra-op Plan:   Post-operative Plan:   Informed Consent: I have reviewed the patients History and Physical, chart, labs and discussed the procedure including the risks, benefits and alternatives for the proposed anesthesia with the patient or authorized representative who has indicated his/her understanding and acceptance.   Dental advisory given  Plan Discussed with: CRNA and Anesthesiologist  Anesthesia Plan Comments:         Anesthesia Quick Evaluation

## 2014-12-23 NOTE — Progress Notes (Signed)
Patient ID: Kyle West, male   DOB: 08/30/1951, 63 y.o.   MRN: [id number] Notified by RN of bowel visible in [name]. On exam, has clear evisceration. [name] holding. Need to return to OR for ex lap and closure, likely retentions. I will D/W Dr. Cornett. Procedure, risks, and benefits D/W his daughter and she agrees. Niels Cranshaw, MD, MPH, FACS Trauma: [phone] General Surgery: [phone]  

## 2014-12-23 NOTE — Anesthesia Postprocedure Evaluation (Signed)
  Anesthesia Post-op Note  Patient: Kyle West  Procedure(s) Performed: Procedure(s): EXPLORATORY LAPAROTOMY WITH CLOSURE OF EVISCERATION (N/A)  Patient Location: PACU  Anesthesia Type:General  Level of Consciousness: awake and alert   Airway and Oxygen Therapy: Patient Spontanous Breathing  Post-op Pain: none  Post-op Assessment: Post-op Vital signs reviewed  Post-op Vital Signs: Reviewed  Last Vitals:  Filed Vitals:   12/16/2014 2300  BP: 121/64  Pulse: 94  Temp: 36.4 C  Resp: 16    Complications: No apparent anesthesia complications

## 2014-12-23 NOTE — Brief Op Note (Signed)
September 21, 2015 - 12/18/2014  8:19 PM  PATIENT:  Romana JuniperNathaniel Presley  64 y.o. male  PRE-OPERATIVE DIAGNOSIS:  EVISCERATION  POST-OPERATIVE DIAGNOSIS:  EVISCERATION  PROCEDURE:  Procedure(s): EXPLORATORY LAPAROTOMY WITH CLOSURE OF EVISCERATION (N/A)  SURGEON:  Surgeon(s) and Role:    * Harriette Bouillonhomas Lionardo Haze, MD - Primary    ANESTHESIA:   general  EBL:  Total I/O In: 750 [I.V.:750] Out: -   BLOOD ADMINISTERED:none  DRAINS: none   LOCAL MEDICATIONS USED:  NONE  SPECIMEN:  No Specimen  DISPOSITION OF SPECIMEN:  N/A  COUNTS:  YES  TOURNIQUET:  * No tourniquets in log *  DICTATION: .Other Dictation: Dictation Number [id number]  PLAN OF CARE: Admit to inpatient   PATIENT DISPOSITION:  step down stable   Delay start of Pharmacological VTE agent (>24hrs) due to surgical blood loss or risk of bleeding: no

## 2014-12-23 NOTE — Progress Notes (Signed)
Since seeing pt earlier this afternoon, pt c/o increasing abd pain.  Notified by RN that there is visible bowel in abd pouch.  Dr. Janee Mornhompson was notified by RN.  Came to see pt.  He remains hemodynamically stable with bowel noted in pouch.  Pt going back to OR ASAP.  D/C po meds and convert back to IV for now.  DTat

## 2014-12-23 NOTE — Progress Notes (Addendum)
PROGRESS NOTE  Kyle West ZOX:[id number] DOB: 1951/07/23 DOA: 12/09/2014 PCP: Charmaine Downs, MD  Brief history 64yo male with hx HTN, CAD s/p CABG, cirrhosis of unclear etiology (followed at [facility]), reported recent hx pancreatitis. Presented to OSH 2/11 with 2 day hx worsening abd pain, nausea and vomiting. CT at outside hospital revealed pneumatosis, hepatic and portal venous gas and infrarenal AAA. He was tx to [facility] for further eval with concern for ischemic bowel. The patient was started on intravenous Unasyn and vancomycin for sepsis and peritonitis secondary to ischemic bowel. Gen. surgery was consulted. The patient underwent a laparotomy on 12/15/2014 with small bowel resection and application of wound VAC(Dr Cornett). Postoperatively, the patient was left intubated. He developed hypotension requiring central venous catheter and vasopressors. The patient was taken back to the OR on January 17, 2015 for a second look operation secondary to ischemia involving his ileum and small bowel resection. The small bowel remained viable with no signs of ischemia.   He was weaned off of vasopressors and extubated on 12/20/2014. The patient's postoperative course was also complicated by atrial fibrillation with RVR. He was placed on amiodarone for short period of time. He has converted back to sinus rhythm. Assessment/Plan: Ischemic bowel/peritonitis -appreciate surgery followup -POD #7/5--s/p ex lap with open abdomen and SBR, s/p relook and closure -diet advancement per surgery--> full liquids -no vomiting with NG out -TPN per surgery--weaning off  Septic shock -Secondary to ischemic bowel/peritonitis -Remained stable off vasopressors -Weaned off vasopressors 12/20/2014 -Continue IV Zosyn--last day is 12/07/2014  Acute respiratory failure -Patient was intubated February 11>>> February 15 -presently stable on room air PAF with RVR -Presently in sinus -CHADS-VASc = 1 -weaned off amio  drip on 2/16 -Change patient back to oral metoprolol tartrate  AKI -Secondary to sepsis and volume depletion -Resolved Hyperglycemia -Hemoglobin A1c--pending -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  EXAM: PORTABLE CHEST - 1 VIEW  COMPARISON:  2015/01/17  FINDINGS: Endotracheal tube is in place with tip just above the level of the carina. Nasogastric tube is in place with tip overlying the level of the stomach. Patient has had median sternotomy and CABG. Left IJ central line tip overlies the superior vena cava.  The heart is enlarged. There is dense opacity of the left lung base consistent with atelectasis or infiltrate. Small bilateral pleural effusions are identified. There has been some improvement in aeration of the right lung base.  IMPRESSION: 1. Postoperative lines and tubes. 2. Slightly improved aeration of the right lung base. 3. Persistent left lung base atelectasis and effusion.   Electronically Signed   By: Norva Pavlov M.D.   On: 12/19/2014 08:04   Dg Chest Port 1 View  01-17-15   CLINICAL DATA:  Hypoxia/respiratory failure  EXAM: PORTABLE CHEST - 1 VIEW  COMPARISON:  December 17, 2014  FINDINGS: Endotracheal tube tip is 2.2 cm above the carina. Nasogastric tube tip and side port are in the stomach. Central catheter tip is in the superior vena cava. No pneumothorax. There is left lower lobe consolidation with small left effusion. There is a small right effusion with right base atelectasis. Heart is mildly enlarged. Patient is status post coronary  artery bypass grafting. No adenopathy.  IMPRESSION: Tube and catheter positions as described  without pneumothorax. Areas of consolidation with effusions. Heart prominent but stable.   Electronically Signed   By: Bretta Bang III M.D.   On: 12/10/2014 14:48   Dg Chest Port 1 View  12/17/2014   CLINICAL DATA:  Respiratory failure.  EXAM: PORTABLE CHEST - 1 VIEW  COMPARISON:  December 16, 2014.  FINDINGS: Stable cardiomediastinal silhouette. Endotracheal and nasogastric tubes are unchanged in position. Left internal jugular catheter is also noted with distal tip overlying expected position of the SVC. Status post coronary artery bypass graft. Bibasilar opacities are noted most consistent with subsegmental atelectasis. No pneumothorax is noted. No significant pleural effusion is noted.  IMPRESSION: Stable support apparatus. Stable bibasilar subsegmental atelectasis.   Electronically Signed   By: Lupita Raider, M.D.   On: 12/17/2014 07:53   Dg Chest Port 1 View  12/29/2014   CLINICAL DATA:  Status post central line placement  EXAM: PORTABLE CHEST - 1 VIEW  COMPARISON:  Chest film from earlier in the same day  FINDINGS: Postsurgical changes are again seen. An endotracheal tube is now noted 5.2 cm above the carina. A nasogastric catheter is seen extending into the stomach. A left jugular central line is noted with the tip in the mid superior vena cava. No pneumothorax is seen. Bibasilar atelectatic changes are again noted.  IMPRESSION: Tubes and lines as described above. No pneumothorax is noted. Stable bibasilar atelectatic changes are seen.   Electronically Signed   By: Alcide Clever M.D.   On: 12/07/2014 19:11   Dg Chest Port 1 View  12/25/2014   CLINICAL DATA:  Abdominal aortic aneurysm. Shortness of breath. Coronary artery disease. Pre-op respiratory exam  EXAM: PORTABLE CHEST - 1 VIEW  COMPARISON:  None.  FINDINGS: Low lung volumes are seen. Streaky opacity lung bases is likely due to atelectasis, although pneumonia cannot definitely be excluded. No evidence of pleural effusion. Heart size is  within normal limits allowing for low lung volumes. Previous CABG noted.  IMPRESSION: Low lung volumes with probable bibasilar atelectasis, although pneumonia cannot definitely be excluded.   Electronically Signed   By: Myles Rosenthal M.D.   On: 01/01/2015 13:16        Subjective:  patient complains of abdominal pain at incision site. Denies any fevers, chills, chest pain, shortness breath, nausea, vomiting. He is passing flatus. He is having bowel movements. Denies any dysuria.  Objective: Filed Vitals:   12/29/14 1208 29-Dec-2014 1209 2014-12-29 1250 12-29-14 1257  BP: 137/52     Pulse: 91 87  85  Temp:   97.9 F (36.6 C)   TempSrc:   Oral   Resp: Height:      Weight:      SpO2: 94% 97%  97%    Intake/Output Summary (Last 24 hours) at 2014-12-29 1639 Last data filed at Dec 29, 2014 1500  Gross per 24 hour  Intake   2650 ml  Output    300 ml  Net   2350 ml   Weight change: -12.4 kg (-27 lb 5.4 oz) Exam:   General:  Pt is alert, follows commands appropriately, not in acute distress  HEENT: No icterus, No thrush,  [place]/AT  Cardiovascular: RRR, S1/S2, no rubs, no gallops  Respiratory: CTA bilaterally, no wheezing, no crackles, no rhonchi  Abdomen: Soft/+BS,  anterior abdominal wall defect with serosanguineous drainage;non distended, no guarding  Extremities: No edema, No  lymphangitis, No petechiae, No rashes, no synovitis  Data Reviewed: Basic Metabolic Panel:  Recent Labs Lab 12/27/2014 0405 12/19/14 0430 12/20/14 0340 12/21/14 0355 12/22/14 0540 12/25/2014 0340  NA 134* 132* 133* 133* 135 132*  K 4.3 4.3 4.2 3.7 3.7 3.4*  CL 105 107 104 104 106 102  CO2 GLUCOSE 127* 152* 129* 135* 101* 125*  BUN 30* 37* 40* 39* 38* 38*  CREATININE 1.06 1.34 1.29 1.10 1.13 1.04  CALCIUM 7.5* 7.4* 7.3* 7.8* 8.1* 7.8*  MG 1.7 2.2 2.4  --   --  2.9*  PHOS 2.4 2.8 3.5  --   --  4.4   Liver Function Tests:  Recent Labs Lab 01/01/2015 0405 12/20/14 0340  12/27/2014 0340  AST 52* 50* 41*  ALT ALKPHOS 39 63 89  BILITOT 1.8* 2.4* 3.2*  PROT 4.2* 4.4* 4.5*  ALBUMIN 2.5* 2.2* 2.1*   No results for input(s): LIPASE, AMYLASE in the last 168 hours.  Recent Labs Lab 12/21/14 1919  AMMONIA 35*   CBC:  Recent Labs Lab 12/10/2014 0405  12/19/14 0839 12/20/14 0340 12/21/14 0355 12/22/14 0540 12/28/2014 0340  WBC 8.6  < > 11.5* 11.7* 9.0 10.3 11.0*  NEUTROABS 5.6  --   --  8.2*  --   --   --   HGB 8.8*  < > 9.8* 9.5* 9.7* 10.2* 10.1*  HCT 26.2*  < > 28.3* 27.9* 27.6* 29.1* 29.5*  MCV 106.9*  < > 100.0 101.8* 97.5 96.7 96.4  PLT 80*  < > 91* 82* 82* 90* 102*  < > = values in this interval not displayed. Cardiac Enzymes:  Recent Labs Lab 12/19/2014 1306 12/19/2014 1815 12/19/14 0015  TROPONINI <0.03 <0.03 0.03   BNP: Invalid input(s): POCBNP CBG:  Recent Labs Lab 12/22/14 1601 12/22/14 1935 12/22/14 2316 12/26/2014 0528 12/13/2014 1335  GLUCAP 98 116* 138* 128* 169*    Recent Results (from the past 240 hour(s))  Surgical pcr screen     Status: None   Collection Time: 01/05/15  3:13 PM  Result Value Ref Range Status   MRSA, PCR NEGATIVE NEGATIVE Final   Staphylococcus aureus NEGATIVE NEGATIVE Final    Comment:        The Xpert SA Assay (FDA approved for NASAL specimens in patients over 12 years of age), is one component of a comprehensive surveillance program.  Test performance has been validated by [facility] for patients greater than or equal to 31 year old. It is not intended to diagnose infection nor to guide or monitor treatment.   Clostridium Difficile by PCR     Status: None   Collection Time: 12/21/14  3:32 AM  Result Value Ref Range Status   C difficile by pcr NEGATIVE NEGATIVE Final     Scheduled Meds: . chlorhexidine  15 mL Mouth Rinse BID  . heparin subcutaneous  5,000 Units Subcutaneous 3 times per day  . [START ON 12/24/2014] insulin aspart  0-9 Units Subcutaneous TID WC  . lactulose  20 g  Oral BID  . metoprolol tartrate  25 mg Oral BID  . pantoprazole  40 mg Oral Daily  . piperacillin-tazobactam (ZOSYN)  IV  3.375 g Intravenous 3 times per day  . rifaximin  550 mg Oral BID  . ursodiol  300 mg Oral BID   Continuous Infusions: . Marland KitchenTPN (CLINIMIX-E) Adult 110 mL/hr at 12/22/14 1900   And  . fat emulsion 240  mL (12/22/14 1900)  . Marland Kitchen.TPN (CLINIMIX-E) Adult     And  . fat emulsion       Ryleigh Esqueda, DO  [facility] Pager [phone]  If 7PM-7AM, please contact night-coverage [email or web address] Password [email or web address] 12/06/2014, 4:39 PM   LOS: 7 days

## 2014-12-23 NOTE — Progress Notes (Signed)
PARENTERAL NUTRITION CONSULT NOTE - FOLLOW UP  Pharmacy Consult for TPN Indication: Prolonged ileus  Allergies  Allergen Reactions  . Oxycodone Hives and Other (See Comments)    Hallucinations    Patient Measurements: Height: 6\' 1"  (185.4 cm) Weight: 223 lb 8.7 oz (101.4 kg) IBW/kg (Calculated) : 79.9  Vital Signs: Temp: 98 F (36.7 C) (02/18 0333) Temp Source: Oral (02/18 0333) BP: 127/52 mmHg (02/18 0333) Pulse Rate: 86 (02/18 0333) Intake/Output from previous day: 02/17 0701 - 02/18 0700 In: 2530 [P.O.:840; I.V.:30; IV Piggyback:100; TPN:1560] Out: 1800 [Urine:300; Drains:1500] Intake/Output from this shift:    Labs:  Recent Labs  12/21/14 0355 12/22/14 0540 12/19/2014 0340  WBC 9.0 10.3 11.0*  HGB 9.7* 10.2* 10.1*  HCT 27.6* 29.1* 29.5*  PLT 82* 90* 102*     Recent Labs  12/21/14 0355 12/22/14 0540 12/22/2014 0340  NA 133* 135 132*  K 3.7 3.7 3.4*  CL 104 106 102  CO2 21 25 27   GLUCOSE 135* 101* 125*  BUN 39* 38* 38*  CREATININE 1.10 1.13 1.04  CALCIUM 7.8* 8.1* 7.8*  MG  --   --  2.9*  PHOS  --   --  4.4  PROT  --   --  4.5*  ALBUMIN  --   --  2.1*  AST  --   --  41*  ALT  --   --  16  ALKPHOS  --   --  89  BILITOT  --   --  3.2*   Estimated Creatinine Clearance: 89.8 mL/min (by C-G formula based on Cr of 1.04).    Recent Labs  12/22/14 1935 12/22/14 2316 12/19/2014 0528  GLUCAP 116* 138* 128*    Medications:  Scheduled:  . chlorhexidine  15 mL Mouth Rinse BID  . heparin subcutaneous  5,000 Units Subcutaneous 3 times per day  . insulin aspart  0-9 Units Subcutaneous 6 times per day  . lactulose  20 g Oral BID  . metoprolol  2.5 mg Intravenous 4 times per day  . pantoprazole (PROTONIX) IV  40 mg Intravenous Q24H  . piperacillin-tazobactam (ZOSYN)  IV  3.375 g Intravenous 3 times per day    Insulin Requirements in the past 24 hours:  2 units sensitive SSI, 15 units insulin in TPN  Admit:  64 year old man transferred from outside  hospital for concern of ischemic bowel. He underwent surgery on 2/11 - exploratory laparotomy, small bowel resection and application of a wound vac. TPN to start in anticipation of prolonged bowel rest/ileus.   GI:  S/P ex. Lap with SBR on 2/11. Also history of pancreatitis and cirrhosis child pugh B- PTA: Lactulose, PPI, Rifaximin, spironolactione, Ursodiol. S/P side-to-side small bowel anastomosis and closure of abdomen on 2/13. Abd soft. NG d/c'd- no vomiting. Continued diarrhea, Lactulose resumed for liver dz.Advancing to full liquid diet on 2/18.  PPI-IV, Lactulose.   Endo: No history of diabetes. CBGs < 150  Lytes: K 3.4, Na 132, Mg 2.9, Phos 4.4   Renal: AKI- resolved. Scr 1.04 today. UOP 0.1 mL/kg/hr  Pulm:RA.   Cards: History of CAD on simvastatin and metoprolol pta. BP soft, HR wnl. Metoprolol IV  Hepatobil: history of cirrhosis/NASH. AST 41 , ALT wnl, Tbili 2.4>>3.2, INR 1.67 (2/13). Baseline palb low at 6.4 & TG 54.  Ammonia 35 (2/16).  Lactulose resumed  Neuro: Zoloft PTA for depression. Fentanyl drip- to be d/c'd 2/15, Fent prn  ID: Zosyn x 7days for intra-abdominal coverage (2/11>>).  Afebrile, WBC 11, CDiff(-) 2/16.  Best Practices: SCDs, mouthcare, Hep SQ, PPI   TPN Access: Triple Lumen IJ (placed 2/11) TPN day#: 12/17/14 >>  Nutritional Goals: (per RD note on 2/12 - Thanks!) 1515-1840 kcal, >= 167g protein while on vent  2200-2400 kcal, 120-135g of protein per day when off vent  Current Nutrition:  Clear liquid diet  Clinimix-E 5/15 at 14ml/hr and Lipids at 69ml/hr, providing 132g protein and 2354Kcal and meeting 100% of his goals  Plan:  Continue Clinimix E 5/15 at 154mL/hr with lipids at 37mL/hr Continue SSI Daily MVI and TE  Continue 15 units insulin in TPN bag  F/U diet advancement, wean TPN as appropriate TPN labs in AM KCl 10 meQ x 2 runs today   Vinnie Level, PharmD., BCPS Clinical Pharmacist Pager  [phone]

## 2014-12-23 NOTE — Evaluation (Signed)
Occupational Therapy Evaluation Patient DetaiCristian Davittaniel West MRN: [id number] DOB: 01/25/1951 Today's Date: 01/18/2015    History of Present Illness pt presents with Ischemic Bowel and has had an ex lap with re-exploration and now with wound vac.  pt with hx of Cirrhosis.     Clinical Impression   PTA pt lived at home with his wife and was independent with ADLs. Pt currently limited by abdominal pain, deconditioning, and generalized weakness. Pt requires mod A to stand and assistance for LB ADLs. Pt would be a good candidate for CIR to return to Mod I level before d/c home. Pt will benefit from acute OT to address functional mobility and ADLs through strengthening and activity tolerance.     Follow Up Recommendations  CIR;Supervision/Assistance - 24 hour    Equipment Recommendations  Other (comment) (TBD at next venue)    Recommendations for Other Services       Precautions / Restrictions Precautions Precautions: Fall Precaution Comments: Abdominal incisions and Eakin bag. Restrictions Weight Bearing Restrictions: No      Mobility Bed Mobility Overal bed mobility: Needs Assistance Bed Mobility: Rolling;Sidelying to Sit Rolling: Min guard Sidelying to sit: Min assist;HOB elevated       General bed mobility comments: Min A to sit up with hand held assistance.   Transfers Overall transfer level: Needs assistance Equipment used: 1 person hand held assist Transfers: Sit to/from [employer] Sit to Stand: Mod assist;+2 safety/equipment Stand pivot transfers: Min assist;+2 safety/equipment       General transfer comment: Pt required min to mod A to stand from bed on lowest setting with hand held assistance and min assist to pivot to chair. Daughter assisted with line management.     Balance Overall balance assessment: Needs assistance Sitting-balance support: No upper extremity supported;Feet supported Sitting balance-Leahy Scale: Fair      Standing balance support: During functional activity Standing balance-Leahy Scale: Poor                              ADL Overall ADL's : Needs assistance/impaired Eating/Feeding: Set up;Sitting Eating/Feeding Details (indicate cue type and reason): liquids only Grooming: Set up;Sitting   Upper Body Bathing: Minimal assitance;Sitting   Lower Body Bathing: Maximal assistance;Sit to/from stand   Upper Body Dressing : Moderate assistance;Sitting   Lower Body Dressing: Total assistance;+2 for physical assistance;Sit to/from stand   Toilet Transfer: Moderate assistance;Stand-pivot;+2 for safety/equipment Toilet Transfer Details (indicate cue type and reason): bed>recliner; +2 helpful for lines but pt required only min to mod A to stand, Min a to pivot           General ADL Comments: Pt limited by deconditioning and weakness as well as abdominal pain.      Vision  Pt denies blurriness and reports no change from baseline.           Pertinent Vitals/Pain Pain Assessment: No/denies pain     Hand Dominance Right   Extremity/Trunk Assessment Upper Extremity Assessment Upper Extremity Assessment: Generalized weakness   Lower Extremity Assessment Lower Extremity Assessment: Generalized weakness   Cervical / Trunk Assessment Cervical / Trunk Assessment: Kyphotic   Communication Communication Communication: HOH   Cognition Arousal/Alertness: Awake/alert Behavior During Therapy: Flat affect Overall Cognitive Status: Impaired/Different from baseline Area of Impairment: Orientation;Attention;Memory;Following commands;Safety/judgement;Awareness;Problem solving Orientation Level: Disoriented to;Time;Situation Current Attention Level: Selective Memory: Decreased short-term memory Following Commands: Follows one step commands with increased time;Follows multi-step commands inconsistently Safety/Judgement:  Decreased awareness of safety;Decreased awareness of  deficits Awareness: Emergent Problem Solving: Slow processing;Difficulty sequencing;Requires verbal cues;Requires tactile cues General Comments: Pt continues to present with some cognitive impairments, but daughter reports he seems to be improving              Home Living Family/patient expects to be discharged to:: Inpatient rehab Living Arrangements: Spouse/significant other                                      Prior Functioning/Environment Level of Independence: Independent             OT Diagnosis: Generalized weakness;Cognitive deficits;Acute pain   OT Problem List: Decreased strength;Decreased range of motion;Decreased activity tolerance;Impaired balance (sitting and/or standing);Decreased cognition;Decreased safety awareness;Decreased knowledge of use of DME or AE;Decreased knowledge of precautions;Pain   OT Treatment/Interventions: Self-care/ADL training;Therapeutic exercise;Energy conservation;DME and/or AE instruction;Therapeutic activities;Patient/family education;Balance training;Cognitive remediation/compensation    OT Goals(Current goals can be found in the care plan section) Acute Rehab OT Goals Patient Stated Goal: to not hurt OT Goal Formulation: With patient Time For Goal Achievement: 01/06/15 Potential to Achieve Goals: Good ADL Goals Pt Will Perform Grooming: with min assist;standing Pt Will Perform Lower Body Bathing: with min assist;sit to/from stand;with adaptive equipment Pt Will Perform Lower Body Dressing: with min assist;sit to/from stand;with adaptive equipment Pt Will Transfer to Toilet: with min assist;ambulating;bedside commode Pt Will Perform Toileting - Clothing Manipulation and hygiene: with min assist;sit to/from stand  OT Frequency: Min 2X/week    End of Session Equipment Utilized During Treatment: Gait belt Nurse Communication: Other (comment) (pt in recliner chair)  Activity Tolerance: Patient tolerated treatment  well Patient left: in chair;with call bell/phone within reach;with family/visitor present   Time: [id number] OT Time Calculation (min): 21 min Charges:  OT General Charges $OT Visit: 1 Procedure OT Evaluation $Initial OT Evaluation Tier I: 1 Procedure G-Codes:    Kyle West, Kyle West 12/15/2014, 4:15 PM  Kyle West, OTR/L Occupational Therapist [phone] (pager)

## 2014-12-23 NOTE — Progress Notes (Signed)
5 Days Post-Op  Subjective: No new complaints  Objective: Vital signs in last 24 hours: Temp:  [98 F (36.7 C)-98.4 F (36.9 C)] 98 F (36.7 C) (02/18 0700) Pulse Rate:  [82-93] 86 (02/18 0333) Resp:  [14-22] 22 (02/18 0333) BP: (122-166)/(52-59) 127/52 mmHg (02/18 0333) SpO2:  [95 %-100 %] 97 % (02/18 0333) Weight:  [223 lb 8.7 oz (101.4 kg)] 223 lb 8.7 oz (101.4 kg) (02/18 0500) Last BM Date: 12/22/14  Intake/Output from previous day: 02/17 0701 - 02/18 0700 In: 2530 [P.O.:840; I.V.:30; IV Piggyback:100; TPN:1560] Out: 1800 [Urine:300; Drains:1500] Intake/Output this shift:    General appearance: cooperative Resp: clear to auscultation bilaterally Cardio: regular rate and rhythm GI: wound clean and fascia intact, continues to drain some ascitic fluid, dressing changed with [place] straps  Lab Results:   Recent Labs  12/22/14 0540 12/25/2014 0340  WBC 10.3 11.0*  HGB 10.2* 10.1*  HCT 29.1* 29.5*  PLT 90* 102*   BMET  Recent Labs  12/22/14 0540 12/29/2014 0340  NA 135 132*  K 3.7 3.4*  CL 106 102  CO2 25 27  GLUCOSE 101* 125*  BUN 38* 38*  CREATININE 1.13 1.04  CALCIUM 8.1* 7.8*   PT/INR No results for input(s): LABPROT, INR in the last 72 hours. ABG No results for input(s): PHART, HCO3 in the last 72 hours.  Invalid input(s): PCO2, PO2  Studies/Results: No results found.  Anti-infectives: Anti-infectives    Start     Dose/Rate Route Frequency Ordered Stop   05-14-15 1430  piperacillin-tazobactam (ZOSYN) IVPB 3.375 g     3.375 g 12.5 mL/hr over 240 Minutes Intravenous 3 times per day 05-14-15 1403        Assessment/Plan: 1. POD 7/5, s/p ex lap with open abdomen and SBR, s/p relook and closure 2. Cirrhosis 3. VDRF, extubated on 2/15 4. Septic shock secondary to ischemia bowel, improved 5. Post op ileus, resolving 6. SPCM/TNA 7. Thrombocytopenia  Wound care Therapies Advance to fulls I spoke with his wife  LOS: 7 days     Kesleigh Morson E 01/01/2015

## 2014-12-23 NOTE — Progress Notes (Signed)
OOB to chair, daughter states, patient coughing and started to complain of abdominal pain.  Back to bed and noticed secretions from eakin pouch now bloody and intestines can be seen through pouch.  Dr. Janee Mornhompson notified and will see patient.  Daughter in room, Dr. Janee Mornhompson notified daughter and patient he will have to go back to surgery.  Patient confused, daughter signed surgical consent.  Continue to watch. To surgery at 1725.

## 2014-12-23 NOTE — Progress Notes (Signed)
Eakin pouch placed to abdominal wound and drainage bag. Draining large amount of serosang. secretions. Continue to watch.

## 2014-12-23 NOTE — Interval H&P Note (Signed)
History and Physical Interval Note:  12/10/2014 5:55 PM  Kyle West  has presented today for surgery, with the diagnosis of EVISCERATION  The various methods of treatment have been discussed with the patient and family. After consideration of risks, benefits and other options for treatment, the patient has consented to  Procedure(s): EXPLORATORY LAPAROTOMY (N/A) as a surgical intervention .  The patient's history has been reviewed, patient examined, no change in status, stable for surgery.  I have reviewed the patient's chart and labs.  Questions were answered to the patient's satisfaction.     Franciscojavier Wronski A.

## 2014-12-24 ENCOUNTER — Inpatient Hospital Stay (HOSPITAL_COMMUNITY): Payer: BLUE CROSS/BLUE SHIELD

## 2014-12-24 DIAGNOSIS — G9341 Metabolic encephalopathy: Secondary | ICD-10-CM

## 2014-12-24 DIAGNOSIS — R5381 Other malaise: Secondary | ICD-10-CM

## 2014-12-24 LAB — PHOSPHORUS: Phosphorus: 5.1 mg/dL — ABNORMAL HIGH (ref 2.3–4.6)

## 2014-12-24 LAB — CBC
HEMATOCRIT: 29.3 % — AB (ref 39.0–52.0)
Hemoglobin: 10.6 g/dL — ABNORMAL LOW (ref 13.0–17.0)
MCH: 34.6 pg — ABNORMAL HIGH (ref 26.0–34.0)
MCHC: 36.2 g/dL — AB (ref 30.0–36.0)
MCV: 95.8 fL (ref 78.0–100.0)
Platelets: 144 10*3/uL — ABNORMAL LOW (ref 150–400)
RBC: 3.06 MIL/uL — AB (ref 4.22–5.81)
RDW: 16.3 % — ABNORMAL HIGH (ref 11.5–15.5)
WBC: 28.9 10*3/uL — ABNORMAL HIGH (ref 4.0–10.5)

## 2014-12-24 LAB — BASIC METABOLIC PANEL
Anion gap: 6 (ref 5–15)
BUN: 51 mg/dL — ABNORMAL HIGH (ref 6–23)
CO2: 22 mmol/L (ref 19–32)
CREATININE: 1.52 mg/dL — AB (ref 0.50–1.35)
Calcium: 7.5 mg/dL — ABNORMAL LOW (ref 8.4–10.5)
Chloride: 101 mmol/L (ref 96–112)
GFR calc non Af Amer: 47 mL/min — ABNORMAL LOW (ref >90)
GFR, EST AFRICAN AMERICAN: 54 mL/min — AB (ref >90)
Glucose, Bld: 171 mg/dL — ABNORMAL HIGH (ref 70–99)
Potassium: 4.6 mmol/L (ref 3.5–5.1)
Sodium: 129 mmol/L — ABNORMAL LOW (ref 135–145)

## 2014-12-24 LAB — GLUCOSE, CAPILLARY
GLUCOSE-CAPILLARY: 170 mg/dL — AB (ref 70–99)
Glucose-Capillary: 174 mg/dL — ABNORMAL HIGH (ref 70–99)
Glucose-Capillary: 129 mg/dL — ABNORMAL HIGH (ref 70–99)
Glucose-Capillary: 182 mg/dL — ABNORMAL HIGH (ref 70–99)
Glucose-Capillary: 157 mg/dL — ABNORMAL HIGH (ref 70–99)
Glucose-Capillary: 143 mg/dL — ABNORMAL HIGH (ref 70–99)
Glucose-Capillary: 137 mg/dL — ABNORMAL HIGH (ref 70–99)

## 2014-12-24 LAB — HEMOGLOBIN A1C
HEMOGLOBIN A1C: 5.5 % (ref 4.8–5.6)
Mean Plasma Glucose: 111 mg/dL

## 2014-12-24 LAB — AMMONIA: Ammonia: 27 umol/L (ref 11–32)

## 2014-12-24 LAB — MAGNESIUM: MAGNESIUM: 2.7 mg/dL — AB (ref 1.5–2.5)

## 2014-12-24 MED ORDER — SODIUM CHLORIDE 0.9 % IV SOLN: 250.0000 mL | INTRAVENOUS | Status: DC

## 2014-12-24 MED ORDER — SODIUM CHLORIDE 0.9 % IV SOLN
INTRAVENOUS | Status: DC
  Administered 2014-12-24: 13:00:00 via INTRAVENOUS
  Filled 2014-12-24: qty 1000

## 2014-12-24 MED ORDER — FAT EMULSION 20 % IV EMUL
240.0000 mL | INTRAVENOUS | Status: AC
  Administered 2014-12-24: 240 mL via INTRAVENOUS
  Filled 2014-12-24: qty 250

## 2014-12-24 MED ORDER — TRACE MINERALS CR-CU-F-FE-I-MN-MO-SE-ZN IV SOLN
INTRAVENOUS | Status: AC
  Administered 2014-12-24: 17:00:00 via INTRAVENOUS
  Filled 2014-12-24: qty 2640

## 2014-12-24 MED ORDER — SODIUM CHLORIDE 0.9 % IV BOLUS (SEPSIS)
1000.0000 mL | Freq: Once | INTRAVENOUS | Status: AC
  Administered 2014-12-24: 1000 mL via INTRAVENOUS

## 2014-12-24 MED ORDER — HALOPERIDOL LACTATE 5 MG/ML IJ SOLN
5.0000 mg | Freq: Four times a day (QID) | INTRAMUSCULAR | Status: DC | PRN
  Administered 2014-12-24 – 2014-12-29 (×6): 5 mg via INTRAVENOUS
  Filled 2014-12-24 (×7): qty 1

## 2014-12-24 MED ORDER — ALBUMIN HUMAN 5 % IV SOLN
25.0000 g | Freq: Once | INTRAVENOUS | Status: AC
  Administered 2014-12-24: 25 g via INTRAVENOUS
  Filled 2014-12-24: qty 500

## 2014-12-24 MED ORDER — HALOPERIDOL LACTATE 5 MG/ML IJ SOLN
0.5000 mg | Freq: Four times a day (QID) | INTRAMUSCULAR | Status: DC | PRN
  Administered 2014-12-24: 0.5 mg via INTRAVENOUS
  Filled 2014-12-24: qty 1

## 2014-12-24 NOTE — Progress Notes (Signed)
Pt continues to try to pull at lines and attempt to get out of bed, despite safety mittens and attempts to reorient pt. NP notified. New orders received to apply bilateral soft wrist restraints. Daughter is at bedside. Will continue to monitor closely.

## 2014-12-24 NOTE — Progress Notes (Signed)
Pt's has not had any urine output. Bladder scanner reads 0 mL. New orders received for 1000 mL NS bolus. Will administer and continue to monitor.

## 2014-12-24 NOTE — Progress Notes (Addendum)
PROGRESS NOTE  Kyle West ZOX:[id number] DOB: 07-20-1951 DOA: 12/27/14 PCP: Charmaine Downs, MD   Brief history 64yo male with hx HTN, CAD s/p CABG, cirrhosis of unclear etiology (followed at [facility]), reported recent hx pancreatitis. Presented to OSH 2/11 with 2 day hx worsening abd pain, nausea and vomiting. CT at outside hospital revealed pneumatosis, hepatic and portal venous gas and infrarenal AAA. He was tx to [facility] for further eval with concern for ischemic bowel. The patient was started on intravenous Unasyn and vancomycin for sepsis and peritonitis secondary to ischemic bowel. Gen. surgery was consulted. The patient underwent a laparotomy on Dec 27, 2014 with small bowel resection and application of wound VAC(Dr Cornett). Postoperatively, the patient was left intubated. He developed hypotension requiring central venous catheter and vasopressors. The patient was taken back to the OR on 12/24/2014 for a second look operation secondary to ischemia involving his ileum and small bowel resection. The small bowel remained viable with no signs of ischemia. He was weaned off of vasopressors and extubated on 12/20/2014. The patient's postoperative course was also complicated by atrial fibrillation with RVR. He was placed on amiodarone drip for short period of time which was d/ced on 2/16 . He has converted back to sinus rhythm.  The patient was taken back to surgery on 12/17/2014 for dehiscence of his abdominal wall wound.  After surgery on 2/18, he had another episode of Aflutter which has since converted back to sinus. Assessment/Plan: Ischemic bowel/peritonitis -appreciate surgery followup -POD #8/6--s/p ex lap with open abdomen and SBR, s/p relook and closure -POD#1--s/p ex lap and closure with retentions for dehiscence -npo except meds for now -TPN per surgery Wound Dehiscence -2/18--s/p repair Septic shock -Secondary to ischemic bowel/peritonitis -Remained stable off  vasopressors -Weaned off vasopressors 12/20/2014 -Continue IV Zosyn--last day was 12/09/2014  Acute respiratory failure -Patient was intubated February 11>>> February 15 -presently stable on 2L -CXR PAF with RVR -had another episode last night after surgery -Back in sinus -CHADS-VASc = 1 -weaned off amio drip on 2/16 -Change patient back to oral metoprolol tartrate  AKI -Secondary to sepsis and fluid shifts in setting of hypotension -had hypotension after surgery 2/18 -continue IVF -check CVP Hyperglycemia -Hemoglobin A1c--5.5 -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  EXAM: PORTABLE CHEST - 1 VIEW  COMPARISON:  12/10/2014  FINDINGS: Endotracheal tube is in place with tip just above the level of the carina. Nasogastric tube is in place with tip overlying the level of the stomach. Patient has had median sternotomy and CABG. Left IJ central line tip overlies the superior vena cava.  The heart is enlarged. There is dense opacity of the left lung base consistent with atelectasis or infiltrate. Small bilateral pleural effusions are identified. There has been some improvement in aeration of the right lung base.  IMPRESSION: 1. Postoperative lines and tubes. 2. Slightly improved aeration of the right lung base. 3. Persistent left lung base atelectasis and effusion.   Electronically Signed   By: Norva Pavlov M.D.   On: 12/19/2014 08:04   Dg Chest Port 1 View  12/31/2014  CLINICAL DATA:  Hypoxia/respiratory failure  EXAM: PORTABLE CHEST - 1 VIEW  COMPARISON:  December 17, 2014  FINDINGS:  Endotracheal tube tip is 2.2 cm above the carina. Nasogastric tube tip and side port are in the stomach. Central catheter tip is in the superior vena cava. No pneumothorax. There is left lower lobe consolidation with small left effusion. There is a small right effusion with right base atelectasis. Heart is mildly enlarged. Patient is status post coronary artery bypass grafting. No adenopathy.  IMPRESSION: Tube and catheter positions as described without pneumothorax. Areas of consolidation with effusions. Heart prominent but stable.   Electronically Signed   By: Bretta BangWilliam  Woodruff III M.D.   On: 12/10/2014 14:48   Dg Chest Port 1 View  12/17/2014   CLINICAL DATA:  Respiratory failure.  EXAM: PORTABLE CHEST - 1 VIEW  COMPARISON:  December 16, 2014.  FINDINGS: Stable cardiomediastinal silhouette. Endotracheal and nasogastric tubes are unchanged in position. Left internal jugular catheter is also noted with distal tip overlying expected position of the SVC. Status post coronary artery bypass graft. Bibasilar opacities are noted most consistent with subsegmental atelectasis. No pneumothorax is noted. No significant pleural effusion is noted.  IMPRESSION: Stable support apparatus. Stable bibasilar subsegmental atelectasis.   Electronically Signed   By: Lupita RaiderJames  Green Jr, M.D.   On: 12/17/2014 07:53   Dg Chest Port 1 View  Sep 24, 2015   CLINICAL DATA:  Status post central line placement  EXAM: PORTABLE CHEST - 1 VIEW  COMPARISON:  Chest film from earlier in the same day  FINDINGS: Postsurgical changes are again seen. An endotracheal tube is now noted 5.2 cm above the carina. A nasogastric catheter is seen extending into the stomach. A left jugular central line is noted with the tip in the mid superior vena cava. No pneumothorax is seen. Bibasilar atelectatic changes are again noted.  IMPRESSION: Tubes and lines as described above. No pneumothorax is noted. Stable bibasilar atelectatic changes are seen.   Electronically  Signed   By: Alcide CleverMark  Lukens M.D.   On: 0Nov 19, 2016 19:11   Dg Chest Port 1 View  Sep 24, 2015   CLINICAL DATA:  Abdominal aortic aneurysm. Shortness of breath. Coronary artery disease. Pre-op respiratory exam  EXAM: PORTABLE CHEST - 1 VIEW  COMPARISON:  None.  FINDINGS: Low lung volumes are seen. Streaky opacity lung bases is likely due to atelectasis, although pneumonia cannot definitely be excluded. No evidence of pleural effusion. Heart size is within normal limits allowing for low lung volumes. Previous CABG noted.  IMPRESSION: Low lung volumes with probable bibasilar atelectasis, although pneumonia cannot definitely be excluded.   Electronically Signed   By: Myles RosenthalJohn  Stahl M.D.   On: 0Nov 19, 2016 13:16         Subjective: Patient is complaining of abdominal pain. No reports of respiratory distress, vomiting, diarrhea.  Objective: Filed Vitals:   12/24/14 0318 12/24/14 0700 12/24/14 0730 12/24/14 1241  BP: 104/62  155/78   Pulse: 94     Temp: 97.9 F (36.6 C) 97.7 F (36.5 C)  98.2 F (36.8 C)  TempSrc: Axillary Oral  Oral  Resp: 27  27   Height: 6\' 1"  (1.854 m)     Weight: 104.5 kg (230 lb 6.1 oz)     SpO2: 95%  95%     Intake/Output Summary (Last 24 hours) at 12/24/14 1248 Last data filed at 12/24/14 0800  Gross per 24 hour  Intake   2480 ml  Output   2075 ml  Net    405 ml   Weight change: 3.1 kg (6 lb 13.4 oz) Exam:   General:  Pt is alert, follows commands appropriately, not in acute distress  HEENT: No icterus, No thrush, [place]/AT  Cardiovascular: RRR, S1/S2, no rubs, no gallops  Respiratory: Bibasilar crackles. No wheeze. Good air movement  Abdomen: Soft/+BS, non tender, non distended, no guarding  Extremities: No edema, No lymphangitis, No petechiae, No rashes, no synovitis  Data Reviewed: Basic Metabolic Panel:  Recent Labs Lab 12/28/2014 0405 12/19/14 0430 12/20/14 0340 12/21/14 0355 12/22/14 0540 Dec 29, 2014 0340 12/24/14 0335  NA 134* 132* 133* 133* 135  132* 129*  K 4.3 4.3 4.2 3.7 3.7 3.4* 4.6  CL 105 107 104 104 106 102 101  CO2 26 22 26 21 25 27 22   GLUCOSE 127* 152* 129* 135* 101* 125* 171*  BUN 30* 37* 40* 39* 38* 38* 51*  CREATININE 1.06 1.34 1.29 1.10 1.13 1.04 1.52*  CALCIUM 7.5* 7.4* 7.3* 7.8* 8.1* 7.8* 7.5*  MG 1.7 2.2 2.4  --   --  2.9* 2.7*  PHOS 2.4 2.8 3.5  --   --  4.4 5.1*   Liver Function Tests:  Recent Labs Lab 01/01/2015 0405 12/20/14 0340 12-29-2014 0340  AST 52* 50* 41*  ALT 21 18 16   ALKPHOS 39 63 89  BILITOT 1.8* 2.4* 3.2*  PROT 4.2* 4.4* 4.5*  ALBUMIN 2.5* 2.2* 2.1*   No results for input(s): LIPASE, AMYLASE in the last 168 hours.  Recent Labs Lab 12/21/14 1919  AMMONIA 35*   CBC:  Recent Labs Lab 01/01/2015 0405  12/19/14 0839 12/20/14 0340 12/21/14 0355 12/22/14 0540 12-29-14 0340  WBC 8.6  < > 11.5* 11.7* 9.0 10.3 11.0*  NEUTROABS 5.6  --   --  8.2*  --   --   --   HGB 8.8*  < > 9.8* 9.5* 9.7* 10.2* 10.1*  HCT 26.2*  < > 28.3* 27.9* 27.6* 29.1* 29.5*  MCV 106.9*  < > 100.0 101.8* 97.5 96.7 96.4  PLT 80*  < > 91* 82* 82* 90* 102*  < > = values in this interval not displayed. Cardiac Enzymes:  Recent Labs Lab 12/10/2014 1306 12/06/2014 1815 12/19/14 0015  TROPONINI <0.03 <0.03 0.03   BNP: Invalid input(s): POCBNP CBG:  Recent Labs Lab Dec 29, 2014 1335 12/29/14 1713 12/29/2014 2034 Dec 29, 2014 2302 12/24/14 0315  GLUCAP 169* 174* 161* 129* 182*    Recent Results (from the past 240 hour(s))  Surgical pcr screen     Status: None   Collection Time: 12/08/2014  3:13 PM  Result Value Ref Range Status   MRSA, PCR NEGATIVE NEGATIVE Final   Staphylococcus aureus NEGATIVE NEGATIVE Final    Comment:        The Xpert SA Assay (FDA approved for NASAL specimens in patients over 82 years of age), is one component of a comprehensive surveillance program.  Test performance has been validated by [facility] for patients greater than or equal to 24 year old. It is not intended to diagnose  infection nor to guide or monitor treatment.   Clostridium Difficile by PCR     Status: None   Collection Time: 12/21/14  3:32 AM  Result Value Ref Range Status   C difficile by pcr NEGATIVE NEGATIVE Final     Scheduled Meds: . chlorhexidine  15 mL Mouth Rinse BID  . heparin subcutaneous  5,000 Units Subcutaneous 3 times per day  . insulin aspart  0-9 Units Subcutaneous  TID WC  . lactulose  20 g Oral BID  . metoprolol  2.5 mg Intravenous Q6H   Continuous Infusions: . Marland KitchenTPN (CLINIMIX-E) Adult 110 mL/hr at 12/24/14 0800   And  . fat emulsion 240 mL (12/24/14 0800)  . Marland KitchenTPN (CLINIMIX-E) Adult     And  . fat emulsion    . sodium chloride 0.9 % 1,000 mL infusion       Kesleigh Morson, DO  [facility] Pager [phone]  If 7PM-7AM, please contact night-coverage [email or web address] Password [email or web address] 12/24/2014, 12:48 PM   LOS: 8 days

## 2014-12-24 NOTE — Progress Notes (Signed)
Pt returned from PACU. He is restless, pulling at lines, and attempting to get out of bed. VSS. Abdominal dressing CDI. Foam dressing applied to skin tear on R forearm. Safety mitts applied. Family is at bedside. Will continue to monitor.

## 2014-12-24 NOTE — Progress Notes (Signed)
Physical Therapy Treatment Patient Details Name: Kyle West MRN: [id number] DOB: 1951-07-01 Today's Date: 12/24/2014    History of Present Illness pt presents with Ischemic Bowel and has had an ex lap with re-exploration and now with wound vac.  pt with hx of Cirrhosis.      PT Comments    Pt with decreased arousal and cognition today.  Pt needs increased A for all mobility and at this time required 2 people just to sit at EOB.  Continue to feel pt would benefit from CIR at D/C.  Will follow acutely.    Follow Up Recommendations  CIR     Equipment Recommendations  None recommended by PT    Recommendations for Other Services       Precautions / Restrictions Precautions Precautions: Fall Precaution Comments: Abdominal incisions. Restrictions Weight Bearing Restrictions: No    Mobility  Bed Mobility Overal bed mobility: Needs Assistance Bed Mobility: Rolling;Sidelying to Sit;Sit to Sidelying Rolling: Mod assist Sidelying to sit: Mod assist;+2 for physical assistance;HOB elevated     Sit to sidelying: Mod assist;+2 for physical assistance General bed mobility comments: pt requiring increased A for mobility today and needs facilitation for initiation.    Transfers                    Ambulation/Gait                 Stairs            Wheelchair Mobility    Modified Rankin (Stroke Patients Only)       Balance Overall balance assessment: Needs assistance Sitting-balance support: Bilateral upper extremity supported;Feet supported Sitting balance-Leahy Scale: Poor Sitting balance - Comments: pt leaning heavily on UEs and seems to need increased effort to maintain sitting.                              Cognition Arousal/Alertness: Awake/alert Behavior During Therapy: Flat affect Overall Cognitive Status: Impaired/Different from baseline Area of Impairment: Orientation;Attention;Memory;Following  commands;Safety/judgement;Awareness;Problem solving Orientation Level: Disoriented to;Place;Time;Situation Current Attention Level: Focused Memory: Decreased short-term memory;Decreased recall of precautions Following Commands: Follows one step commands inconsistently Safety/Judgement: Decreased awareness of safety;Decreased awareness of deficits Awareness: Intellectual Problem Solving: Decreased initiation;Slow processing;Difficulty sequencing;Requires verbal cues;Requires tactile cues General Comments: pt with decreased level of cognition today and less interactive than previous session with this PT.  pt with minimal verbalizations.      Exercises      General Comments        Pertinent Vitals/Pain Pain Assessment: Faces Faces Pain Scale: Hurts little more Pain Location: pt unable to state.   Pain Intervention(s): Monitored during session;Premedicated before session;Repositioned    Home Living                      Prior Function            PT Goals (current goals can now be found in the care plan section) Acute Rehab PT Goals Patient Stated Goal: to not hurt PT Goal Formulation: With patient/family Time For Goal Achievement: 01/05/15 Potential to Achieve Goals: Good Progress towards PT goals: Not progressing toward goals - comment (pt back to OR yesterday and decreased cognition today.)    Frequency  Min 3X/week    PT Plan Current plan remains appropriate    Co-evaluation             End of Session Equipment  Utilized During Treatment: Gait belt Activity Tolerance: Patient limited by fatigue (Limited by decreaed cognition) Patient left: in bed;with call bell/phone within reach;with bed alarm set;with restraints reapplied     Time: [id number] PT Time Calculation (min) (ACUTE ONLY): 14 min  Charges:  $Therapeutic Activity: 8-22 mins                    G CodesSunny Schlein:      Tamiko Leopard F, [place] CarolinaPT [phone] 12/24/2014, 11:24 AM

## 2014-12-24 NOTE — Consult Note (Signed)
Physical Medicine and Rehabilitation Consult Reason for Consult: Debilitation related to ischemic bowel peritonitis Referring Physician: Triad   HPI: Kyle West is a 64 y.o. 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 Patient presented from outside hospital [facility] with persistent abdominal pain, nausea, vomiting and findings of metabolic acidosis. Patient independent prior to admission living with his wife. 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 Concern for ischemic bowel and transferred to [facility]. Underwent exploratory laparotomy with resection of ileum 12/06/2014 per Dr. Luisa Hart with application of wound VAC. Hospital course VDRF and extubated 12/20/2014 as well as bouts of hypotension requiring vasopressors. Developed an ileus that slowly improved he did require TMA for a short time. Bouts of confusion and restlessness requiring restraints. Bouts of atrial fibrillation with RVR he did receive amiodarone for a short time. On 12/31/2014 with increasing abdominal pain and nurse noted visible bowel and abdominal pouch. Patient underwent exploratory laparotomy with closure Evisceration 12/06/2014--had re-exploration with closure of fascial dehiscence on 12/24/14. Increased confusion over the weekend with some confusion today--wrist restraints still applied. Subcutaneous heparin has been added for DVT prophylaxis. Mild elevation in creatinine 1.52 and monitored. Physical therapy evaluation completed 12/22/2014 with recommendations of physical medicine rehabilitation consult.   Review of Systems  Gastrointestinal: Positive for nausea, vomiting and abdominal pain.  Psychiatric/Behavioral: Positive for depression.  All other systems reviewed and are negative.  Past Medical History  Diagnosis Date  .  Cirrhosis   . CAD (coronary artery disease)   . Hypertension   . Pancreatitis   . Depression   . AAA (abdominal aortic aneurysm) without rupture    Past Surgical History  Procedure Laterality Date  . Hernia repair    . Coronary artery bypass graft    . Laparotomy N/A 12/20/2014    Procedure: EXPLORATORY LAPAROTOMY;  Surgeon: Harriette Bouillon, MD;  Location: [facility] OR;  Service: General;  Laterality: N/A;  . Bowel resection N/A 12/25/2014    Procedure: SMALL BOWEL RESECTION;  Surgeon: Harriette Bouillon, MD;  Location: [facility] OR;  Service: General;  Laterality: N/A;  . Application of wound vac N/A 12/20/2014    Procedure: APPLICATION OF WOUND VAC;  Surgeon: Harriette Bouillon, MD;  Location: [facility] OR;  Service: General;  Laterality: N/A;  . Laparotomy N/A 01/02/2015    Procedure: EXPLORATORY LAPAROTOMY;  Surgeon: Harriette Bouillon, MD;  Location: [facility] OR;  Service: General;  Laterality: N/A;   History reviewed. No pertinent family history. Social History:  reports that he has quit smoking. His smoking use included Cigarettes. He smoked 1.50 packs per day. He quit smokeless tobacco use about 33 years ago. He reports that he does not drink alcohol. His drug history is not on file. Allergies:  Allergies  Allergen Reactions  . Oxycodone Hives and Other (See Comments)    Hallucinations   Medications Prior to Admission  Medication Sig Dispense Refill  . albuterol (PROVENTIL HFA;VENTOLIN HFA) 108 (90 BASE) MCG/ACT inhaler Inhale 2 puffs into the lungs every 6 (six) hours as needed for wheezing or shortness of breath.    . allopurinol (ZYLOPRIM) 300 MG tablet Take 300 mg by mouth daily.    . ciprofloxacin (CIPRO) 500 MG tablet Take 500 mg by mouth once a week. On Sunday    . clobetasol ointment (TEMOVATE) 0.05 % Apply 1 application  topically 2 (two) times daily as needed (for irritation).    . ferrous sulfate 325 (65 FE) MG tablet Take 325 mg by mouth 3 (three) times daily with meals.    . folic acid (FOLVITE) 400 MCG  tablet Take 400 mcg by mouth daily.    . furosemide (LASIX) 40 MG tablet Take 40 mg by mouth 2 (two) times daily.    . hydrocortisone 2.5 % cream Apply 1 application topically 2 (two) times daily as needed (for irritation).    . hydrOXYzine (ATARAX/VISTARIL) 10 MG tablet Take 10 mg by mouth at bedtime.    Marland Kitchen lactulose (CHRONULAC) 10 GM/15ML solution Take 20 g by mouth 2 (two) times daily as needed for mild constipation (Take 30ml daily, if less than 3 bowel movements, take second dose).    . Melatonin 3 MG TABS Take 3 mg by mouth at bedtime.    . metoprolol tartrate (LOPRESSOR) 25 MG tablet Take 25 mg by mouth 2 (two) times daily.    . pantoprazole (PROTONIX) 40 MG tablet Take 40 mg by mouth 2 (two) times daily.    . rifaximin (XIFAXAN) 550 MG TABS tablet Take 550 mg by mouth 2 (two) times daily.    . sertraline (ZOLOFT) 50 MG tablet Take 50 mg by mouth daily.    . simvastatin (ZOCOR) 10 MG tablet Take 10 mg by mouth daily.    Marland Kitchen spironolactone (ALDACTONE) 25 MG tablet Take 25 mg by mouth daily.    . ursodiol (ACTIGALL) 300 MG capsule Take 600 mg by mouth 2 (two) times daily.    . vitamin E 400 UNIT capsule Take 400 Units by mouth daily.      Home: Home Living Family/patient expects to be discharged to:: Inpatient rehab Living Arrangements: Spouse/significant other  Functional History: Prior Function Level of Independence: Independent Functional Status:  Mobility: Bed Mobility Overal bed mobility: Needs Assistance Bed Mobility: Rolling, Sidelying to Sit Rolling: Min guard Sidelying to sit: Min assist, HOB elevated General bed mobility comments: Min A to sit up with hand held assistance.  Transfers Overall transfer level: Needs assistance Equipment used: 1 person hand held assist Transfers: Sit to/from Stand, Stand Pivot Transfers Sit to Stand: Mod assist, +2 safety/equipment Stand pivot transfers: Min assist, +2 safety/equipment General transfer comment: Pt required min to mod A to  stand from bed on lowest setting with hand held assistance and min assist to pivot to chair. Daughter assisted with line management.       ADL: ADL Overall ADL's : Needs assistance/impaired Eating/Feeding: Set up, Sitting Eating/Feeding Details (indicate cue type and reason): liquids only Grooming: Set up, Sitting Upper Body Bathing: Minimal assitance, Sitting Lower Body Bathing: Maximal assistance, Sit to/from stand Upper Body Dressing : Moderate assistance, Sitting Lower Body Dressing: Total assistance, +2 for physical assistance, Sit to/from stand Toilet Transfer: Moderate assistance, Stand-pivot, +2 for safety/equipment Toilet Transfer Details (indicate cue type and reason): bed>recliner; +2 helpful for lines but pt required only min to mod A to stand, Min a to pivot General ADL Comments: Pt limited by deconditioning and weakness as well as abdominal pain.   Cognition: Cognition Overall Cognitive Status: Impaired/Different from baseline Arousal/Alertness: Awake/alert Orientation Level: Oriented to person, Disoriented to place, Disoriented to time, Disoriented to situation Attention: Focused Focused Attention: Appears intact Memory: Impaired Memory Impairment: Storage deficit, Retrieval deficit Awareness: Impaired Awareness Impairment: Intellectual impairment Problem Solving: Impaired Problem Solving Impairment: Verbal basic, Functional basic Safety/Judgment: Impaired Cognition Arousal/Alertness: Awake/alert Behavior During Therapy: Flat  affect Overall Cognitive Status: Impaired/Different from baseline Area of Impairment: Orientation, Attention, Memory, Following commands, Safety/judgement, Awareness, Problem solving Orientation Level: Disoriented to, Time, Situation Current Attention Level: Selective Memory: Decreased short-term memory Following Commands: Follows one step commands with increased time, Follows multi-step commands inconsistently Safety/Judgement: Decreased  awareness of safety, Decreased awareness of deficits Awareness: Emergent Problem Solving: Slow processing, Difficulty sequencing, Requires verbal cues, Requires tactile cues General Comments: Pt continues to present with some cognitive impairments, but daughter reports he seems to be improving  Blood pressure 104/62, pulse 94, temperature 97.9 F (36.6 C), temperature source Axillary, resp. rate 27, height 6\' 1"  (1.854 m), weight 104.5 kg (230 lb 6.1 oz), SpO2 95 %. Physical Exam  HENT:  Head: Normocephalic.  Eyes: EOM are normal.  Neck: Normal range of motion. Neck supple. No thyromegaly present.  Cardiovascular:  Cardiac rate controlled  Respiratory: Effort normal and breath sounds normal. No respiratory distress.  GI:  Abdominal dressing in place with positive bowel sounds  Neurological:  Alert male with wrist restraints in place. He is impulsive confused continuing to request to get out of bed. He was able to provide his name. Limited awareness of deficits. Could tell us [facility] and his city of residence. Speech quite dysarthric and he's easily distracted. Moves all 4's. Senses pain.  Psychiatric:  Confused, sedated    Results for orders placed or performed during the hospital encounter of 12/27/2014 (from the past 24 hour(s))  Glucose, capillary     Status: Abnormal   Collection Time: 12/06/2014  8:18 AM  Result Value Ref Range   Glucose-Capillary 145 (H) 70 - 99 mg/dL  Glucose, capillary     Status: Abnormal   Collection Time: 12/22/2014  1:35 PM  Result Value Ref Range   Glucose-Capillary 169 (H) 70 - 99 mg/dL  Type and screen     Status: None   Collection Time: 12/27/2014  5:10 PM  Result Value Ref Range   ABO/RH(D) O POS    Antibody Screen NEG    Sample Expiration 12/26/2014   Glucose, capillary     Status: Abnormal   Collection Time: 12/29/2014  8:34 PM  Result Value Ref Range   Glucose-Capillary 161 (H) 70 - 99 mg/dL  Glucose, capillary     Status: Abnormal     Collection Time: 12/22/2014 11:02 PM  Result Value Ref Range   Glucose-Capillary 129 (H) 70 - 99 mg/dL  Glucose, capillary     Status: Abnormal   Collection Time: 12/24/14  3:15 AM  Result Value Ref Range   Glucose-Capillary 182 (H) 70 - 99 mg/dL  Basic metabolic panel     Status: Abnormal   Collection Time: 12/24/14  3:35 AM  Result Value Ref Range   Sodium 129 (L) 135 - 145 mmol/L   Potassium 4.6 3.5 - 5.1 mmol/L   Chloride 101 96 - 112 mmol/L   CO2 22 19 - 32 mmol/L   Glucose, Bld 171 (H) 70 - 99 mg/dL   BUN 51 (H) 6 - 23 mg/dL   Creatinine, Ser 4.091.52 (H) 0.50 - 1.35 mg/dL   Calcium 7.5 (L) 8.4 - 10.5 mg/dL   GFR calc non Af Amer 47 (L) >90 mL/min   GFR calc Af Amer 54 (L) >90 mL/min   Anion gap 6 5 - 15  Magnesium     Status: Abnormal   Collection Time: 12/24/14  3:35 AM  Result Value Ref Range   Magnesium 2.7 (H)  1.5 - 2.5 mg/dL  Phosphorus     Status: Abnormal   Collection Time: 12/24/14  3:35 AM  Result Value Ref Range   Phosphorus 5.1 (H) 2.3 - 4.6 mg/dL   No results found.  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 Does the need for close, 24 hr/day medical supervision in concert with the patient's rehab needs make it unreasonable for this patient to be served in a less intensive setting? Yes 2. Co-Morbidities requiring supervision/potential complications: peritonitis, respiratory failure, altered mental status, nutritional needs, ARI 3. Due to bladder management, bowel management, safety, skin/wound care, disease management, medication administration, pain management and patient education, does the patient require 24 hr/day rehab nursing? Yes 4. Does the patient require coordinated care of a physician, rehab nurse, PT (1-2 hrs/day, 5 days/week), OT (1-2 hrs/day, 5 days/week) and SLP (1-2 hrs/day, 5 days/week) to address physical and functional deficits in the context of the above medical diagnosis(es)?  Yes Addressing deficits in the following areas: balance, endurance, locomotion, strength, transferring, bowel/bladder control, bathing, dressing, feeding, grooming, toileting, cognition and psychosocial support 5. Can the patient actively participate in an intensive therapy program of at least 3 hrs of therapy per day at least 5 days per week? Potentially 6. The potential for patient to make measurable gains while on inpatient rehab is good 7. Anticipated functional outcomes upon discharge from inpatient rehab are supervision  with PT, supervision to min assist with OT, supervision with SLP. 8. Estimated rehab length of stay to reach the above functional goals is: 13-20 days 9. Does the patient have adequate social supports and living environment to accommodate these discharge functional goals? Yes 10. Anticipated D/C setting: Home 11. Anticipated post D/C treatments: HH therapy and Outpatient therapy 12. Overall Rehab/Functional Prognosis: excellent  RECOMMENDATIONS: This patient's condition is appropriate for continued rehabilitative care in the following setting: CIR Patient has agreed to participate in recommended program. N/A Note that insurance prior authorization may be required for reimbursement for recommended care.  Comment: Spoke with spouse. They are already established with [facility], and wife would like his rehab closer to home. Recommend [employer]. Will follow along.  Ranelle Oyster, MD, [facility] Physical Medicine & Rehabilitation 12/27/2014     12/24/2014

## 2014-12-24 NOTE — Op Note (Signed)
NAMRosary Lively:  West, Kyle            ACCOUNT NO.:  [id number]  MEDICAL RECORD NO.:  [id number]  LOCATION:  3S15C                        FACILITY:  [facility]  PHYSICIAN:  Maisie Fushomas A. Bufford Helms, M.D.DATE OF BIRTH:  1951-05-24  DATE OF PROCEDURE:  12/21/2014 DATE OF DISCHARGE:                              OPERATIVE REPORT   PREOPERATIVE DIAGNOSIS:  History of exploratory laparotomy and small bowel resection due to ischemic bowel with fascia dehiscence.  POSTOPERATIVE DIAGNOSIS:  History of exploratory laparotomy and small bowel resection due to ischemic bowel with fascia dehiscence.  PROCEDURE:  Exploratory laparotomy and closure of fascia with retention sutures.  SURGEON:  Harriette Bouillonhomas Shaka Zech, M.D.  ANESTHESIA:  General endotracheal anesthesia.  ESTIMATED BLOOD LOSS:  Approximately, 150 mL.  SPECIMENS:  None.  DRAINS:  None.  INDICATIONS FOR PROCEDURE:  The patient is a 64 year old male transferred from [facility] [place] last Thursday with findings of portal venous gas.  After being accepted by the medical service here, General Surgery was consulted for evaluation.  He was monitored and as the day went on, his condition deteriorated and he was taken to the operating room where he underwent exploratory laparotomy and resection of most of his ileum with an open abdomen.  He was vacuum packed dressing closed and brought back 2 days later for second-look operation.  He had no further ongoing ischemia, had a small bowel anastomosis and his fascia was closed.  He was doing well until today when he was coughing and he eviscerated.  At this point in time, he was tolerating a diet and moving his bowels.  He was brought emergently back to the operating room for closure of his fascia.  Risk, benefits, and alternative therapies were discussed with the patient's family.  They agreed to proceed.  DESCRIPTION OF PROCEDURE:  The patient was brought to the operating room and placed supine on the OR table.   After induction of general anesthesia, he had an Eakin pouch over the dehiscence, this was removed and he was prepped and draped in a sterile fashion.  He was already on Zosyn.  I was able to examine the abdominal cavity, run a small intestine and evaluate his anastomosis and these were all normal without signs of leakage or other ongoing ischemic issue.  His colon was normal. His liver was cirrhotic and he did have ascites.  This was irrigated out.  We then closed his fascia.  His tissues were of poor quality and did not hold the suture well at all.  I ran a double stranded PDS 0 from top-to-bottom and also put retention sutures every 3 cm or so using #1 nylon and red rubber catheters as bridges.  These were placed intermittently 3 cm apart or so with a running suture to close him with retention sutures.  These were all tied down over the red rubber after tying the running suture closed.  We then packed the wound with Kerlix.  All final counts were found to be correct of sponge, needle, and instrument at this point in time.  The patient was then taken back to the step-down unit in a stable condition.     [name], M.D.     TAC/MEDQ  D:  12/20/2014  T:  12/24/2014  Job:  [id number]

## 2014-12-24 NOTE — Progress Notes (Signed)
SLP Cancellation Note  Patient Details Name: Kyle West MRN: [id number] DOB: October 03, 1951   Cancelled treatment:       Reason Eval/Treat Not Completed: Fatigue/lethargy limiting ability to participate; poor arousal.  Will follow.    Blenda MountsCouture, Levonia Wolfley Laurice 12/24/2014, 1:44 PM

## 2014-12-24 NOTE — Progress Notes (Signed)
PARENTERAL NUTRITION CONSULT NOTE - FOLLOW UP  Pharmacy Consult for TPN Indication: Prolonged ileus  Allergies  Allergen Reactions  . Oxycodone Hives and Other (See Comments)    Hallucinations    Patient Measurements: Height:  (185.4 cm) Weight: 230 lb 6.1 oz (104.5 kg) IBW/kg (Calculated) : 79.9  Vital Signs: Temp: 97.7 F (36.5 C) (02/19 0700) Temp Source: Oral (02/19 0700) BP: 104/62 mmHg (02/19 0318) Pulse Rate: 94 (02/19 0318) Intake/Output from previous day: 02/18 0701 - 02/19 0700 In: 2840 [I.V.:750; IV Piggyback:50; TPN:2040] Out: 3125 [Stool:3050; Blood:75] Intake/Output from this shift:    Labs:  Recent Labs  12/22/14 0540 12/09/2014 0340  WBC 10.3 11.0*  HGB 10.2* 10.1*  HCT 29.1* 29.5*  PLT 90* 102*     Recent Labs  12/22/14 0540 12/09/2014 0340 12/24/14 0335  NA 135 132* 129*  K 3.7 3.4* 4.6  CL 106 102 101  CO2 GLUCOSE 101* 125* 171*  BUN 38* 38* 51*  CREATININE 1.13 1.04 1.52*  CALCIUM 8.1* 7.8* 7.5*  MG  --  2.9* 2.7*  PHOS  --  4.4 5.1*  PROT  --  4.5*  --   ALBUMIN  --  2.1*  --   AST  --  41*  --   ALT  --  16  --   ALKPHOS  --  89  --   BILITOT  --  3.2*  --    Estimated Creatinine Clearance: 62.3 mL/min (by C-G formula based on Cr of 1.52).    Recent Labs  12/11/2014 2034 12/18/2014 2302 12/24/14 0315  GLUCAP 161* 129* 182*    Medications:  Scheduled:  . chlorhexidine  15 mL Mouth Rinse BID  . fentaNYL      . heparin subcutaneous  5,000 Units Subcutaneous 3 times per day  . insulin aspart  0-9 Units Subcutaneous TID WC  . lactulose  20 g Oral BID  . metoprolol      . metoprolol  2.5 mg Intravenous Q6H    Insulin Requirements in the past 24 hours:  0 units sensitive SSI, 15 units insulin in TPN  Admit:  64 year old man transferred from outside hospital for concern of ischemic bowel. He underwent surgery on 2/11 - exploratory laparotomy, small bowel resection and application of a wound vac. TPN to  start in anticipation of prolonged bowel rest/ileus.   GI:  S/P ex. Lap with SBR on 2/11. Also history of pancreatitis and cirrhosis child pugh B- PTA: Lactulose, PPI, Rifaximin, spironolactione, Ursodiol. S/P side-to-side small bowel anastomosis and closure of abdomen on 2/13. Abd soft. NG d/c'd- no vomiting.On 2/18, bowel visible in [place]. Returned to OR on 2/18 for ex lap with closure of evisceration.   Endo: No history of diabetes. CBGs 129-182  Lytes: K 4.6, Na 129, Mg 2.7, Phos 5.1  Renal: AKI s/p surgery. Scr up to 1.52 today. No UOP yesterday, hydrating with NS @ 100 mL/hr   Pulm:2.5 L [place]   Cards: History of CAD. BP soft, HR wnl. Metoprolol IV  Hepatobil: history of cirrhosis/NASH. AST 41 , ALT wnl, Tbili 2.4>>3.2, INR 1.67 (2/13). Baseline palb low at 6.4 & TG 54.  Ammonia 35 (2/16).  Lactulose resumed  Neuro: Now with post anesthesia hepatic encephalopathy, On lactulose,  fent prn, morphine prn   ID: Zosyn x 7days for intra-abdominal coverage (2/11>>). Afebrile, WBC 11, CDiff(-) 2/16.  Best Practices: SCDs, mouthcare, Hep SQ, PPI   TPN Access: Triple  Lumen IJ (placed 2/11) TPN day#: 12/17/14 >>  Nutritional Goals: (per RD note on 2/12 - Thanks!) 1515-1840 kcal, >= 167g protein while on vent  2200-2400 kcal, 120-135g of protein per day when off vent  Current Nutrition:  Clear liquid diet  Clinimix-E 5/15 at 16810ml/hr and Lipids at 6810ml/hr, providing 132g protein and 2354Kcal and meeting 100% of his goals  Plan:  Continue Clinimix E 5/15 at 14710mL/hr with lipids at 7210mL/hr Continue SSI Daily MVI and TE  Continue 15 units insulin in TPN bag  F/U diet advancement, wean TPN as appropriate TPN labs in AM   Vinnie LevelBenjamin Abi Shoults, PharmD., BCPS Clinical Pharmacist Pager [phone]

## 2014-12-24 NOTE — Progress Notes (Signed)
Utilization review completed.  

## 2014-12-24 NOTE — Progress Notes (Signed)
Pt continues to be agitated, pull at lines, and attempt to get out of bed. NP notified. New orders received. Will carry out orders and continue to monitor.

## 2014-12-24 NOTE — Progress Notes (Signed)
1 Day Post-Op  Subjective: Mild agitation, trying to get OOB, restraints on  Objective: Vital signs in last 24 hours: Temp:  [97.5 F (36.4 C)-97.9 F (36.6 C)] 97.9 F (36.6 C) (02/19 0318) Pulse Rate:  [81-153] 94 (02/19 0318) Resp:  [15-32] 27 (02/19 0318) BP: (104-187)/(52-99) 104/62 mmHg (02/19 0318) SpO2:  [90 %-97 %] 95 % (02/19 0318) Weight:  [230 lb 6.1 oz (104.5 kg)] 230 lb 6.1 oz (104.5 kg) (02/19 0318) Last BM Date: 12/19/2014  Intake/Output from previous day: 02/18 0701 - 02/19 0700 In: 2840 [I.V.:750; IV Piggyback:50; TPN:2040] Out: 3125 [Stool:3050; Blood:75] Intake/Output this shift:    General appearance: mild agitation Resp: clear to auscultation bilaterally Cardio: regular rate and rhythm GI: retentions in place, serosanguinous ascitic drainage, some distention, only a few BS  Lab Results:   Recent Labs  12/22/14 0540 12/22/2014 0340  WBC 10.3 11.0*  HGB 10.2* 10.1*  HCT 29.1* 29.5*  PLT 90* 102*   BMET  Recent Labs  12/13/2014 0340 12/24/14 0335  NA 132* 129*  K 3.4* 4.6  CL 102 101  CO2 27 22  GLUCOSE 125* 171*  BUN 38* 51*  CREATININE 1.04 1.52*  CALCIUM 7.8* 7.5*   PT/INR No results for input(s): LABPROT, INR in the last 72 hours. ABG No results for input(s): PHART, HCO3 in the last 72 hours.  Invalid input(s): PCO2, PO2  Studies/Results: No results found.  Anti-infectives: Anti-infectives    Start     Dose/Rate Route Frequency Ordered Stop   12/08/2014 2200  [MAR Hold]  rifaximin (XIFAXAN) tablet 550 mg  Status:  Discontinued     (MAR Hold since 12/16/2014 1721)   550 mg Oral 2 times daily 12/20/2014 1634 12/24/2014 1726   Mar 28, 2015 1430  piperacillin-tazobactam (ZOSYN) IVPB 3.375 g     3.375 g 12.5 mL/hr over 240 Minutes Intravenous 3 times per day Mar 28, 2015 1403 12/24/14 0347     12/24/2014   A/P  1. POD 8/6/1, s/p ex lap with open abdomen and SBR, s/p relook and closure, s/p ex lap and closure with retentions for dehiscence 2.  Cirrhosis - post anesthesia hepatic encephalopathy 3. Post op ileus 4. SPCM/TNA 5. AKI - needs further volume resuscitation with ascitic fluid shifts - per primary service I spoke with his daughter at the bedsdie  Violeta GelinasBurke Sarena Jezek, MD, MPH, FACS Trauma: [phone] General Surgery: [phone]

## 2014-12-25 ENCOUNTER — Encounter (HOSPITAL_COMMUNITY): Payer: Self-pay | Admitting: Surgery

## 2014-12-25 LAB — COMPREHENSIVE METABOLIC PANEL
ALBUMIN: 2.3 g/dL — AB (ref 3.5–5.2)
ALT: 23 U/L (ref 0–53)
ANION GAP: 4 — AB (ref 5–15)
AST: 62 U/L — ABNORMAL HIGH (ref 0–37)
Alkaline Phosphatase: 90 U/L (ref 39–117)
BUN: 68 mg/dL — ABNORMAL HIGH (ref 6–23)
CO2: 20 mmol/L (ref 19–32)
Calcium: 7.1 mg/dL — ABNORMAL LOW (ref 8.4–10.5)
Chloride: 106 mmol/L (ref 96–112)
Creatinine, Ser: 1.72 mg/dL — ABNORMAL HIGH (ref 0.50–1.35)
GFR, EST AFRICAN AMERICAN: 47 mL/min — AB (ref >90)
GFR, EST NON AFRICAN AMERICAN: 40 mL/min — AB (ref >90)
GLUCOSE: 135 mg/dL — AB (ref 70–99)
Potassium: 4.8 mmol/L (ref 3.5–5.1)
SODIUM: 130 mmol/L — AB (ref 135–145)
Total Bilirubin: 3.1 mg/dL — ABNORMAL HIGH (ref 0.3–1.2)
Total Protein: 4.2 g/dL — ABNORMAL LOW (ref 6.0–8.3)

## 2014-12-25 LAB — PHOSPHORUS: Phosphorus: 6 mg/dL — ABNORMAL HIGH (ref 2.3–4.6)

## 2014-12-25 LAB — GLUCOSE, CAPILLARY
GLUCOSE-CAPILLARY: 144 mg/dL — AB (ref 70–99)
GLUCOSE-CAPILLARY: 147 mg/dL — AB (ref 70–99)
GLUCOSE-CAPILLARY: 152 mg/dL — AB (ref 70–99)
GLUCOSE-CAPILLARY: 162 mg/dL — AB (ref 70–99)
Glucose-Capillary: 152 mg/dL — ABNORMAL HIGH (ref 70–99)
Glucose-Capillary: 149 mg/dL — ABNORMAL HIGH (ref 70–99)

## 2014-12-25 LAB — BLOOD GAS, ARTERIAL
Acid-base deficit: 7.3 mmol/L — ABNORMAL HIGH (ref 0.0–2.0)
BICARBONATE: 17.1 meq/L — AB (ref 20.0–24.0)
DRAWN BY: 10552
O2 CONTENT: 2 L/min
O2 SAT: 94.7 %
PCO2 ART: 31.2 mmHg — AB (ref 35.0–45.0)
Patient temperature: 98.6
TCO2: 18.1 mmol/L (ref 0–100)
pH, Arterial: 7.358 (ref 7.350–7.450)
pO2, Arterial: 82 mmHg (ref 80.0–100.0)

## 2014-12-25 LAB — CBC
HCT: 25.6 % — ABNORMAL LOW (ref 39.0–52.0)
HCT: 25.1 % — ABNORMAL LOW (ref 39.0–52.0)
HEMOGLOBIN: 9 g/dL — AB (ref 13.0–17.0)
HEMOGLOBIN: 8.8 g/dL — AB (ref 13.0–17.0)
MCH: 33.7 pg (ref 26.0–34.0)
MCH: 34.4 pg — AB (ref 26.0–34.0)
MCHC: 35.2 g/dL (ref 30.0–36.0)
MCHC: 35.1 g/dL (ref 30.0–36.0)
MCV: 95.9 fL (ref 78.0–100.0)
MCV: 98 fL (ref 78.0–100.0)
PLATELETS: 136 10*3/uL — AB (ref 150–400)
Platelets: 126 10*3/uL — ABNORMAL LOW (ref 150–400)
RBC: 2.67 MIL/uL — ABNORMAL LOW (ref 4.22–5.81)
RBC: 2.56 MIL/uL — ABNORMAL LOW (ref 4.22–5.81)
RDW: 16.6 % — ABNORMAL HIGH (ref 11.5–15.5)
RDW: 16.8 % — AB (ref 11.5–15.5)
WBC: 26.8 10*3/uL — AB (ref 4.0–10.5)
WBC: 23.8 10*3/uL — ABNORMAL HIGH (ref 4.0–10.5)

## 2014-12-25 LAB — LACTIC ACID, PLASMA
Lactic Acid, Venous: 1.7 mmol/L (ref 0.5–2.0)
Lactic Acid, Venous: 1.7 mmol/L (ref 0.5–2.0)

## 2014-12-25 LAB — PROCALCITONIN: Procalcitonin: 9.58 ng/mL

## 2014-12-25 LAB — MAGNESIUM: MAGNESIUM: 3 mg/dL — AB (ref 1.5–2.5)

## 2014-12-25 MED ORDER — PIPERACILLIN-TAZOBACTAM 3.375 G IVPB
3.3750 g | Freq: Three times a day (TID) | INTRAVENOUS | Status: AC
Start: 2014-12-25 — End: 2015-01-03
  Administered 2014-12-25 – 2015-01-03 (×28): 3.375 g via INTRAVENOUS
  Filled 2014-12-25 (×33): qty 50

## 2014-12-25 MED ORDER — LEVALBUTEROL HCL 0.63 MG/3ML IN NEBU
0.6300 mg | INHALATION_SOLUTION | RESPIRATORY_TRACT | Status: DC | PRN
  Administered 2014-12-25 – 2014-12-29 (×7): 0.63 mg via RESPIRATORY_TRACT
  Filled 2014-12-25 (×7): qty 3

## 2014-12-25 MED ORDER — M.V.I. ADULT IV INJ
INJECTION | INTRAVENOUS | Status: AC
  Administered 2014-12-25: 18:00:00 via INTRAVENOUS
  Filled 2014-12-25: qty 2640

## 2014-12-25 MED ORDER — FAT EMULSION 20 % IV EMUL
240.0000 mL | INTRAVENOUS | Status: AC
  Administered 2014-12-25: 240 mL via INTRAVENOUS
  Filled 2014-12-25: qty 250

## 2014-12-25 MED ORDER — VANCOMYCIN HCL 10 G IV SOLR
1750.0000 mg | Freq: Once | INTRAVENOUS | Status: AC
  Administered 2014-12-25: 1750 mg via INTRAVENOUS
  Filled 2014-12-25: qty 1750

## 2014-12-25 MED ORDER — VANCOMYCIN HCL 10 G IV SOLR
1250.0000 mg | INTRAVENOUS | Status: DC
  Administered 2014-12-26 – 2014-12-27 (×2): 1250 mg via INTRAVENOUS
  Filled 2014-12-25 (×4): qty 1250

## 2014-12-25 MED ORDER — PIPERACILLIN-TAZOBACTAM 3.375 G IVPB 30 MIN: 3.3750 g | Freq: Three times a day (TID) | INTRAVENOUS | Status: DC

## 2014-12-25 NOTE — Progress Notes (Signed)
Patient ID: Kyle West, male   DOB: 1951-08-07, 64 y.o.   MRN: [id number]  General Surgery [facility]  POD#: 12/13/08  Subjective: Patient confused but responsive.  Wife at bedside.  Discussed with nurse in unit.  Objective: Vital signs in last 24 hours: Temp:  [97.8 F (36.6 C)-98.7 F (37.1 C)] 98.3 F (36.8 C) (02/20 1100) Pulse Rate:  [100-108] 105 (02/20 0715) Resp:  [21-28] 25 (02/20 0715) BP: (121-139)/(43-58) 139/55 mmHg (02/20 0715) SpO2:  [98 %-100 %] 100 % (02/20 0715) Weight:  [242 lb 15.2 oz (110.2 kg)] 242 lb 15.2 oz (110.2 kg) (02/20 0307) Last BM Date: 12/29/2014  Intake/Output from previous day: 02/19 0701 - 02/20 0700 In: 4040 [I.V.:1750; TPN:2290] Out: 675 [Urine:675] Intake/Output this shift: Total I/O In: 120 [TPN:120] Out: 350 [Urine:300; Stool:50]  Physical Exam: HEENT - sclerae clear, mucous membranes moist Neck - soft Chest - coarse bilaterally with expiratory wheezing Cor - tachycardic Abdomen - tense, distended; no BS present; midline wound intact with fascial retentions; pouch on wound with thin serosanguinous drainage  Lab Results:   Recent Labs  12/24/14 1411 12/25/14 0500  WBC 28.9* 26.8*  HGB 10.6* 9.0*  HCT 29.3* 25.6*  PLT 144* 136*   BMET  Recent Labs  12/24/14 0335 12/25/14 0500  NA 129* 130*  K 4.6 4.8  CL 101 106  CO2 22 20  GLUCOSE 171* 135*  BUN 51* 68*  CREATININE 1.52* 1.72*  CALCIUM 7.5* 7.1*   PT/INR No results for input(s): LABPROT, INR in the last 72 hours. Comprehensive Metabolic Panel:    Component Value Date/Time   NA 130* 12/25/2014 0500   NA 129* 12/24/2014 0335   K 4.8 12/25/2014 0500   K 4.6 12/24/2014 0335   CL 106 12/25/2014 0500   CL 101 12/24/2014 0335   CO2 20 12/25/2014 0500   CO2 22 12/24/2014 0335   BUN 68* 12/25/2014 0500   BUN 51* 12/24/2014 0335   CREATININE 1.72* 12/25/2014 0500   CREATININE 1.52* 12/24/2014 0335   GLUCOSE 135* 12/25/2014 0500   GLUCOSE 171* 12/24/2014 0335   CALCIUM 7.1* 12/25/2014 0500   CALCIUM 7.5* 12/24/2014 0335   AST 62* 12/25/2014 0500   AST 41* 12/31/2014 0340   ALT 23 12/25/2014 0500   ALT 16 12/20/2014 0340   ALKPHOS 90 12/25/2014 0500   ALKPHOS 89 01/02/2015 0340   BILITOT 3.1* 12/25/2014 0500   BILITOT 3.2* 12/31/2014 0340   PROT 4.2* 12/25/2014 0500   PROT 4.5* 12/22/2014 0340   ALBUMIN 2.3* 12/25/2014 0500   ALBUMIN 2.1* 12/26/2014 0340    Studies/Results: Dg Chest Port 1 View  12/24/2014   CLINICAL DATA:  Dyspnea, confusion, fever, history cirrhosis, coronary artery disease, hypertension, pancreatitis, former smoker  EXAM: PORTABLE CHEST - 1 VIEW  COMPARISON:  Portable exam 1326 hours compared to 12/19/2014  FINDINGS: Interval removal of endotracheal and nasogastric tubes.  LEFT jugular central venous catheter tip projects over SVC.  Normal heart size post CABG.  Mediastinal contours and pulmonary vascularity normal.  Decreased bibasilar atelectasis.  Linear subsegmental atelectasis LEFT upper lobe.  Remaining lungs clear.  No pleural effusion or pneumothorax.  IMPRESSION: Decreased bibasilar and slightly increased LEFT upper lobe subsegmental atelectasis.   Electronically Signed   By: Ulyses SouthwardMark  Boles M.D.   On: 12/24/2014 14:42    Anti-infectives: Anti-infectives    Start     Dose/Rate Route Frequency Ordered Stop   12/26/14 0930  vancomycin (VANCOCIN) 1,250 mg  in sodium chloride 0.9 % 250 mL IVPB     1,250 mg 166.7 mL/hr over 90 Minutes Intravenous Every 24 hours 12/25/14 0912     12/25/14 1000  piperacillin-tazobactam (ZOSYN) IVPB 3.375 g     3.375 g 12.5 mL/hr over 240 Minutes Intravenous Every 8 hours 12/25/14 0921     12/25/14 0930  vancomycin (VANCOCIN) 1,750 mg in sodium chloride 0.9 % 500 mL IVPB     1,750 mg 250 mL/hr over 120 Minutes Intravenous  Once 12/25/14 0912     12/25/14 0900  piperacillin-tazobactam (ZOSYN) IVPB 3.375 g  Status:  Discontinued     3.375 g 100 mL/hr over 30  Minutes Intravenous 3 times per day 12/25/14 0852 12/25/14 0921   12/14/2014 2200  [MAR Hold]  rifaximin (XIFAXAN) tablet 550 mg  Status:  Discontinued     (MAR Hold since 12/16/2014 1721)   550 mg Oral 2 times daily 12/08/2014 1634 12/14/2014 1726   12/08/2014 1430  piperacillin-tazobactam (ZOSYN) IVPB 3.375 g     3.375 g 12.5 mL/hr over 240 Minutes Intravenous 3 times per day 12/27/2014 1403 12/24/14 0347      Assessment & Plans: Status post small bowel resection for ischemia, fascial dehiscence  Continue TNA  NPO  Wound care  Await resolution of ileus  Per medical services  Velora Heckler, MD, [facility] Office: [phone]   Shann Lewellyn Judie Petit 12/25/2014

## 2014-12-25 NOTE — Progress Notes (Signed)
PROGRESS NOTE  Kyle West UJW:[id number] DOB: August 02, 1951 DOA: 12/21/2014 PCP: Charmaine Downs, MD   Brief history 63yo male with hx HTN, CAD s/p CABG, cirrhosis of unclear etiology (followed at [facility]), reported recent hx pancreatitis. Presented to OSH 2/11 with 2 day hx worsening abd pain, nausea and vomiting. CT at outside hospital revealed pneumatosis, hepatic and portal venous gas and infrarenal AAA. He was tx to [facility] for further eval with concern for ischemic bowel. The patient was started on intravenous Unasyn and vancomycin for sepsis and peritonitis secondary to ischemic bowel. Gen. surgery was consulted. The patient underwent a laparotomy on 12/29/2014 with small bowel resection and application of wound VAC(Dr Cornett). Postoperatively, the patient was left intubated. He developed hypotension requiring central venous catheter and vasopressors. The patient was taken back to the OR on 12/21/2014 for a second look operation secondary to ischemia involving his ileum and small bowel resection. The small bowel remained viable with no signs of ischemia. He was weaned off of vasopressors and extubated on 12/20/2014. The patient's postoperative course was also complicated by atrial fibrillation with RVR. He was placed on amiodarone drip for short period of time which was d/ced on 2/16 . He has converted back to sinus rhythm. The patient was taken back to surgery on 12/27/2014 for dehiscence of his abdominal wall wound. After surgery on 2/18, he had another episode of Aflutter which has since converted back to sinus. Assessment/Plan: Ischemic bowel/peritonitis -appreciate surgery followup -POD #9/7--s/p ex lap with open abdomen and SBR, s/p relook and closure -POD#2--s/p ex lap and closure with retentions for dehiscence -npo except meds for now -TPN per surgery Wound Dehiscence -2/18--s/p repair Septic shock -Secondary to ischemic bowel/peritonitis -Remained stable off  vasopressors -Weaned off vasopressors 12/20/2014 -Continue IV Zosyn--last day was 12/30/2014  -12/24/14--WBC increase to 28.9--> bloodcultures 2 sets, UA/urine culture, procalcitonin, lactic acid-->restart zosyn, add vanco pending culture data Acute respiratory failure -Patient was intubated February 11>>> February 15 -presently stable on 2L -CXR--bibasilar atelectasis; no consolidations PAF with RVR -had another episode 2/18 after surgery -Back in sinus -CHADS-VASc = 1 -weaned off amio drip on 2/16 -Change patient back to oral metoprolol tartrate  AKI -Secondary to sepsis and fluid shifts in setting of hypotension -had hypotension after surgery 2/18 -continue IVF -check CVP--6-->improved to 9 with IVF and albumin -hoping to plateau soon Hyperglycemia -Hemoglobin A1c--5.5 -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 -Patient follows with hepatology at [facility] -Restart lactulose -Restart rifaximin when able to take po -restart Actigall when able to take po -Ammonia 35-->27 Diarrhea -Cdiff neg on 2/16 Hypertension -Transition IV to po metoprolol tartrate when able to take po Deconditioning  -PT recommends CIR evaluation   Procedures/Studies: Dg Chest Port 1 View  12/24/2014   CLINICAL DATA:  Dyspnea, confusion, fever, history cirrhosis, coronary artery disease, hypertension, pancreatitis, former smoker  EXAM: PORTABLE CHEST - 1 VIEW  COMPARISON:  Portable exam 1326 hours compared to 12/19/2014  FINDINGS: Interval removal of endotracheal and nasogastric tubes.  LEFT jugular central venous catheter tip projects over SVC.  Normal heart size post CABG.  Mediastinal contours and pulmonary vascularity normal.  Decreased bibasilar atelectasis.  Linear subsegmental atelectasis LEFT upper lobe.  Remaining lungs clear.  No pleural effusion or pneumothorax.  IMPRESSION: Decreased bibasilar and slightly increased LEFT upper lobe subsegmental atelectasis.   Electronically Signed   By: Ulyses Southward  M.D.   On: 12/24/2014 14:42   Dg Chest Portable 1 View  12/19/2014   CLINICAL DATA:  Respiratory failure.  EXAM: PORTABLE CHEST - 1 VIEW  COMPARISON:  12/29/2014  FINDINGS: Endotracheal tube is in place with tip just above the level of the carina. Nasogastric tube is in place with tip overlying the level of the stomach. Patient has had median sternotomy and CABG. Left IJ central line tip overlies the superior vena cava.  The heart is enlarged. There is dense opacity of the left lung base consistent with atelectasis or infiltrate. Small bilateral pleural effusions are identified. There has been some improvement in aeration of the right lung base.  IMPRESSION: 1. Postoperative lines and tubes. 2. Slightly improved aeration of the right lung base. 3. Persistent left lung base atelectasis and effusion.   Electronically Signed   By: Norva Pavlov M.D.   On: 12/19/2014 08:04   Dg Chest Port 1 View  12/26/2014   CLINICAL DATA:  Hypoxia/respiratory failure  EXAM: PORTABLE CHEST - 1 VIEW  COMPARISON:  December 17, 2014  FINDINGS: Endotracheal tube tip is 2.2 cm above the carina. Nasogastric tube tip and side port are in the stomach. Central catheter tip is in the superior vena cava. No pneumothorax. There is left lower lobe consolidation with small left effusion. There is a small right effusion with right base atelectasis. Heart is mildly enlarged. Patient is status post coronary artery bypass grafting. No adenopathy.  IMPRESSION: Tube and catheter positions as described without pneumothorax. Areas of consolidation with effusions. Heart prominent but stable.   Electronically Signed   By: Bretta Bang III M.D.   On: 12/07/2014 14:48   Dg Chest Port 1 View  12/17/2014   CLINICAL DATA:  Respiratory failure.  EXAM: PORTABLE CHEST - 1 VIEW  COMPARISON:  December 16, 2014.  FINDINGS: Stable cardiomediastinal silhouette. Endotracheal and nasogastric tubes are unchanged in position. Left internal jugular catheter is  also noted with distal tip overlying expected position of the SVC. Status post coronary artery bypass graft. Bibasilar opacities are noted most consistent with subsegmental atelectasis. No pneumothorax is noted. No significant pleural effusion is noted.  IMPRESSION: Stable support apparatus. Stable bibasilar subsegmental atelectasis.   Electronically Signed   By: Lupita Raider, M.D.   On: 12/17/2014 07:53   Dg Chest Port 1 View  12/17/2014   CLINICAL DATA:  Status post central line placement  EXAM: PORTABLE CHEST - 1 VIEW  COMPARISON:  Chest film from earlier in the same day  FINDINGS: Postsurgical changes are again seen. An endotracheal tube is now noted 5.2 cm above the carina. A nasogastric catheter is seen extending into the stomach. A left jugular central line is noted with the tip in the mid superior vena cava. No pneumothorax is seen. Bibasilar atelectatic changes are again noted.  IMPRESSION: Tubes and lines as described above. No pneumothorax is noted. Stable bibasilar atelectatic changes are seen.   Electronically Signed   By: Alcide Clever M.D.   On: 12/28/2014 19:11   Dg Chest Port 1 View  12/11/2014   CLINICAL DATA:  Abdominal aortic aneurysm. Shortness of breath. Coronary artery disease. Pre-op respiratory exam  EXAM: PORTABLE CHEST - 1 VIEW  COMPARISON:  None.  FINDINGS: Low lung volumes are seen. Streaky opacity lung bases is likely due to atelectasis, although pneumonia cannot definitely be excluded. No evidence of pleural effusion. Heart size is within normal limits allowing for low lung volumes. Previous CABG noted.  IMPRESSION: Low lung volumes with probable bibasilar atelectasis, although pneumonia cannot definitely be excluded.  Electronically Signed   By: Myles RosenthalJohn  Stahl M.D.   On: 12/20/2014 13:16         Subjective: Patient complains of abdominal pain at the surgical site. Denies any fevers, chills, chest pain, short of breath. He remains confused and intermittently agitated. No  vomiting.  Objective: Filed Vitals:   12/24/14 2330 12/25/14 0307 12/25/14 0700 12/25/14 0715  BP: 133/46 139/57  139/55  Pulse: 100 102  105  Temp: 98.3 F (36.8 C) 97.8 F (36.6 C) 98.2 F (36.8 C)   TempSrc: Axillary Axillary Oral   Resp: 25 26  25   Height:  6\' 1"  (1.854 m)    Weight:  110.2 kg (242 lb 15.2 oz)    SpO2: 99% 98%  100%    Intake/Output Summary (Last 24 hours) at 12/25/14 0945 Last data filed at 12/25/14 0800  Gross per 24 hour  Intake   4040 ml  Output    725 ml  Net   3315 ml   Weight change: 5.7 kg (12 lb 9.1 oz) Exam:   General:  Pt is alert, follows commands appropriately, not in acute distress  HEENT: No icterus, No thrush,  [place]/AT  Cardiovascular: RRR, S1/S2, no rubs, no gallops  Respiratory: Fine bibasilar crackles. No wheeze.  Abdomen: Soft/+BS, non tender, non distended, no guarding  Extremities: No edema, No lymphangitis, No petechiae, No rashes, no synovitis  Data Reviewed: Basic Metabolic Panel:  Recent Labs Lab 12/19/14 0430 12/20/14 0340 12/21/14 0355 12/22/14 0540 06-02-15 0340 12/24/14 0335 12/25/14 0500  NA 132* 133* 133* 135 132* 129* 130*  K 4.3 4.2 3.7 3.7 3.4* 4.6 4.8  CL 107 104 104 106 102 101 106  CO2 22 26 21 25 27 22 20   GLUCOSE 152* 129* 135* 101* 125* 171* 135*  BUN 37* 40* 39* 38* 38* 51* 68*  CREATININE 1.34 1.29 1.10 1.13 1.04 1.52* 1.72*  CALCIUM 7.4* 7.3* 7.8* 8.1* 7.8* 7.5* 7.1*  MG 2.2 2.4  --   --  2.9* 2.7* 3.0*  PHOS 2.8 3.5  --   --  4.4 5.1* 6.0*   Liver Function Tests:  Recent Labs Lab 12/20/14 0340 06-02-15 0340 12/25/14 0500  AST 50* 41* 62*  ALT 18 16 23   ALKPHOS 63 89 90  BILITOT 2.4* 3.2* 3.1*  PROT 4.4* 4.5* 4.2*  ALBUMIN 2.2* 2.1* 2.3*   No results for input(s): LIPASE, AMYLASE in the last 168 hours.  Recent Labs Lab 12/21/14 1919 12/24/14 1411  AMMONIA 35* 27   CBC:  Recent Labs Lab 12/20/14 0340 12/21/14 0355 12/22/14 0540 06-02-15 0340 12/24/14 1411  12/25/14 0500  WBC 11.7* 9.0 10.3 11.0* 28.9* 26.8*  NEUTROABS 8.2*  --   --   --   --   --   HGB 9.5* 9.7* 10.2* 10.1* 10.6* 9.0*  HCT 27.9* 27.6* 29.1* 29.5* 29.3* 25.6*  MCV 101.8* 97.5 96.7 96.4 95.8 95.9  PLT 82* 82* 90* 102* 144* 136*   Cardiac Enzymes:  Recent Labs Lab 12/07/2014 1306 12/10/2014 1815 12/19/14 0015  TROPONINI <0.03 <0.03 0.03   BNP: Invalid input(s): POCBNP CBG:  Recent Labs Lab 12/24/14 1608 12/24/14 1919 12/24/14 2330 12/25/14 0309 12/25/14 0809  GLUCAP 143* 137* 152* 152* 162*    Recent Results (from the past 240 hour(s))  Surgical pcr screen     Status: None   Collection Time: 12/15/2014  3:13 PM  Result Value Ref Range Status   MRSA, PCR NEGATIVE NEGATIVE Final   Staphylococcus  aureus NEGATIVE NEGATIVE Final    Comment:        The Xpert SA Assay (FDA approved for NASAL specimens in patients over 50 years of age), is one component of a comprehensive surveillance program.  Test performance has been validated by [facility] for patients greater than or equal to 35 year old. It is not intended to diagnose infection nor to guide or monitor treatment.   Clostridium Difficile by PCR     Status: None   Collection Time: 12/21/14  3:32 AM  Result Value Ref Range Status   C difficile by pcr NEGATIVE NEGATIVE Final     Scheduled Meds: . chlorhexidine  15 mL Mouth Rinse BID  . heparin subcutaneous  5,000 Units Subcutaneous 3 times per day  . insulin aspart  0-9 Units Subcutaneous TID WC  . lactulose  20 g Oral BID  . metoprolol  2.5 mg Intravenous Q6H  . piperacillin-tazobactam (ZOSYN)  IV  3.375 g Intravenous Q8H  . [START ON 12/26/2014] vancomycin  1,250 mg Intravenous Q24H  . vancomycin  1,750 mg Intravenous Once   Continuous Infusions: . Marland KitchenTPN (CLINIMIX-E) Adult 110 mL/hr at 12/25/14 0800   And  . fat emulsion 240 mL (12/25/14 0800)  . TPN (CLINIMIX) Adult without lytes     And  . fat emulsion    . sodium chloride 0.9 % 1,000 mL  infusion 125 mL/hr at 12/24/14 1300     Kenechukwu Eckstein, DO  [facility] Pager [phone]  If 7PM-7AM, please contact night-coverage [email or web address] Password [email or web address] 12/25/2014, 9:45 AM   LOS: 9 days

## 2014-12-25 NOTE — Progress Notes (Signed)
ANTIBIOTIC CONSULT NOTE - INITIAL  Pharmacy Consult for vanc/zosyn Indication: r/o sepsis  Allergies  Allergen Reactions  . Oxycodone Hives and Other (See Comments)    Hallucinations    Patient Measurements: Height: 6\' 1"  (185.4 cm) Weight: 242 lb 15.2 oz (110.2 kg) IBW/kg (Calculated) : 79.9  Vital Signs: Temp: 98.2 F (36.8 C) (02/20 0700) Temp Source: Oral (02/20 0700) BP: 139/55 mmHg (02/20 0715) Pulse Rate: 105 (02/20 0715) Intake/Output from previous day: 02/19 0701 - 02/20 0700 In: 4040 [I.V.:1750; TPN:2290] Out: 675 [Urine:675] Intake/Output from this shift: Total I/O In: 120 [TPN:120] Out: 50 [Stool:50]  Labs:  Recent Labs  12/28/2014 0340 12/24/14 0335 12/24/14 1411 12/25/14 0500  WBC 11.0*  --  28.9* 26.8*  HGB 10.1*  --  10.6* 9.0*  PLT 102*  --  144* 136*  CREATININE 1.04 1.52*  --  1.72*   Estimated Creatinine Clearance: 56.5 mL/min (by C-G formula based on Cr of 1.72). No results for input(s): VANCOTROUGH, VANCOPEAK, VANCORANDOM, GENTTROUGH, GENTPEAK, GENTRANDOM, TOBRATROUGH, TOBRAPEAK, TOBRARND, AMIKACINPEAK, AMIKACINTROU, AMIKACIN in the last 72 hours.   Microbiology: Recent Results (from the past 720 hour(s))  Surgical pcr screen     Status: None   Collection Time: 06-15-15  3:13 PM  Result Value Ref Range Status   MRSA, PCR NEGATIVE NEGATIVE Final   Staphylococcus aureus NEGATIVE NEGATIVE Final    Comment:        The Xpert SA Assay (FDA approved for NASAL specimens in patients over 65 years of age), is one component of a comprehensive surveillance program.  Test performance has been validated by [facility] for patients greater than or equal to 65 year old. It is not intended to diagnose infection nor to guide or monitor treatment.   Clostridium Difficile by PCR     Status: None   Collection Time: 12/21/14  3:32 AM  Result Value Ref Range Status   C difficile by pcr NEGATIVE NEGATIVE Final    Assessment: 63 yom initiating vanc  and restarting zosyn for septic shock secondary to ischemic bowel/peritonitis. S/p zosyn x 8d for intra-abdominal coverage post ex-lap with SBR 2/11 (back to OR 2/13 and 2/19). PCT somewhat elevated 2/11. New PCT, LA labs ordered for today. AKI due to hypotension/fluid shifts. SCr bumped 1.52>>1.72 (~1 on 2/13). CrCl~55. Afeb, wbc elevated, stable 26.8  Zosyn 2/11>> 2/18; 2/20>> 2/20 vanc>>  2/20 BCx2>> 2/20 UC>>  2/16 C diff neg  Goal of Therapy:  Vancomycin trough level 15-20 mcg/ml  Plan:  -Zosyn 3.375g IV q8h - 4h inf -Vanc 1750mg  IV x1; then Vanc 1250mg  IV q24h -F/u LA, PCal -F/u clinical progress, c/s, abx plan, VT@SS  when appropriate -Follow renal function closely with dosing  Babs BertinHaley Mykael Trott, PharmD Clinical Pharmacist - Resident Pager [phone] 12/25/2014 10:30 AM

## 2014-12-25 NOTE — Progress Notes (Addendum)
PARENTERAL NUTRITION CONSULT NOTE - FOLLOW UP  Pharmacy Consult for TPN Indication: Prolonged ileus  Allergies  Allergen Reactions  . Oxycodone Hives and Other (See Comments)    Hallucinations    Patient Measurements: Height:  (185.4 cm) Weight: 242 lb 15.2 oz (110.2 kg) IBW/kg (Calculated) : 79.9  Vital Signs: Temp: 98.2 F (36.8 C) (02/20 0700) Temp Source: Oral (02/20 0700) BP: 139/55 mmHg (02/20 0715) Pulse Rate: 105 (02/20 0715) Intake/Output from previous day: 02/19 0701 - 02/20 0700 In: 4040 [I.V.:1750; TPN:2290] Out: 675 [Urine:675] Intake/Output from this shift: Total I/O In: 120 [TPN:120] Out: 50 [Stool:50]  Labs:  Recent Labs  12/29/14 0340 12/24/14 1411 12/25/14 0500  WBC 11.0* 28.9* 26.8*  HGB 10.1* 10.6* 9.0*  HCT 29.5* 29.3* 25.6*  PLT 102* 144* 136*     Recent Labs  2014-12-29 0340 12/24/14 0335 12/25/14 0500  NA 132* 129* 130*  K 3.4* 4.6 4.8  CL 102 101 106  CO2 GLUCOSE 125* 171* 135*  BUN 38* 51* 68*  CREATININE 1.04 1.52* 1.72*  CALCIUM 7.8* 7.5* 7.1*  MG 2.9* 2.7* 3.0*  PHOS 4.4 5.1* 6.0*  PROT 4.5*  --  4.2*  ALBUMIN 2.1*  --  2.3*  AST 41*  --  62*  ALT 16  --  23  ALKPHOS 89  --  90  BILITOT 3.2*  --  3.1*   Estimated Creatinine Clearance: 56.5 mL/min (by C-G formula based on Cr of 1.72).    Recent Labs  12/24/14 2330 12/25/14 0309 12/25/14 0809  GLUCAP 152* 152* 162*    Medications:  Scheduled:  . chlorhexidine  15 mL Mouth Rinse BID  . heparin subcutaneous  5,000 Units Subcutaneous 3 times per day  . insulin aspart  0-9 Units Subcutaneous TID WC  . lactulose  20 g Oral BID  . metoprolol  2.5 mg Intravenous Q6H  . piperacillin-tazobactam (ZOSYN)  IV  3.375 g Intravenous Q8H  . [START ON 12/26/2014] vancomycin  1,250 mg Intravenous Q24H  . vancomycin  1,750 mg Intravenous Once    Insulin Requirements in the past 24 hours:  5 units sensitive SSI, 15 units insulin in TPN  Admit:  64 year  old man transferred from outside hospital for concern of ischemic bowel. He underwent surgery on 2/11 - exploratory laparotomy, small bowel resection and application of a wound vac. TPN to start in anticipation of prolonged bowel rest/ileus.   GI:  S/P ex. Lap with SBR on 2/11. Also history of pancreatitis and cirrhosis child pugh B- PTA: Lactulose, PPI, Rifaximin, spironolactione, Ursodiol. S/P side-to-side small bowel anastomosis and closure of abdomen on 2/13. Abd soft. NG d/c'd- no vomiting.On 2/18, bowel visible in [place]. POD #2 s/p ex lap with closure.   Endo: No history of diabetes. CBGs 152-162  Lytes: K 4.8, Na 130, Mg 3, Phos 6 - lytes continuing to trend up likely due to renal failure  Renal: AKI s/p surgery. Scr up to 1.72 today. UOP only 0.3 ml/kg/hr yesterday, hydrating with NS @ 125 mL/hr   Pulm:2L [place]   Cards: History of CAD. BP soft, HR wnl. Metoprolol IV  Hepatobil: history of cirrhosis/NASH. AST 41 , ALT wnl, Tbili up to 4.2, INR 1.67 (2/13). Baseline palb low at 6.4 & TG 54.  Ammonia 27, improved on lactulose  Neuro: Now with post anesthesia hepatic encephalopathy, On lactulose,  fent prn, morphine prn   ID: Zosyn x 7days for intra-abdominal coverage (2/11>>). Afebrile,  WBC up to 26.8, CDiff(-) 2/16.  ABX broadened with Vanc, sepsis workup underway with PCT, lactic acid, blood/urine cultures.  Best Practices: SCDs, mouthcare, Hep SQ, PPI   TPN Access: Triple Lumen IJ (placed 2/11) TPN day#: 12/17/14 >>  Nutritional Goals: (per RD note on 2/12 - Thanks!) 1515-1840 kcal, >= 167g protein while on vent  2200-2400 kcal, 120-135g of protein per day when off vent  Current Nutrition:  NPO except meds Clinimix-E 5/15 at 17010ml/hr and Lipids at 7510ml/hr, providing 132g protein and 2354Kcal and meeting 100% of his goals  Plan:  Continue Clinimix 5/15 at 14010mL/hr with lipids at 2010mL/hr.  Will remove electrolytes from today's TPN. Continue SSI Daily  MVI and TE  Continue 15 units insulin in TPN bag  F/U diet advancement, wean TPN as appropriate TPN labs in AM  Estella HuskMichelle Oak Dorey, Pharm.D., BCPS, AAHIVP Clinical Pharmacist Phone: [phone] or [phone] 12/25/2014, 9:34 AM

## 2014-12-25 NOTE — Progress Notes (Signed)
Paged Dr. Corliss Skainssuei regarding pt having 1450 mL of red bloody output. Received orders to hold heparin today and tonight and get a CBC. Will continue to monitor pt.

## 2014-12-26 DIAGNOSIS — J9600 Acute respiratory failure, unspecified whether with hypoxia or hypercapnia: Secondary | ICD-10-CM | POA: Insufficient documentation

## 2014-12-26 LAB — URINE CULTURE: Colony Count: 1000

## 2014-12-26 LAB — GLUCOSE, CAPILLARY
GLUCOSE-CAPILLARY: 148 mg/dL — AB (ref 70–99)
GLUCOSE-CAPILLARY: 164 mg/dL — AB (ref 70–99)
GLUCOSE-CAPILLARY: 151 mg/dL — AB (ref 70–99)
Glucose-Capillary: 158 mg/dL — ABNORMAL HIGH (ref 70–99)
Glucose-Capillary: 144 mg/dL — ABNORMAL HIGH (ref 70–99)
Glucose-Capillary: 128 mg/dL — ABNORMAL HIGH (ref 70–99)
Glucose-Capillary: 137 mg/dL — ABNORMAL HIGH (ref 70–99)

## 2014-12-26 LAB — CBC
HCT: 24 % — ABNORMAL LOW (ref 39.0–52.0)
Hemoglobin: 8.5 g/dL — ABNORMAL LOW (ref 13.0–17.0)
MCH: 34.1 pg — AB (ref 26.0–34.0)
MCHC: 35.4 g/dL (ref 30.0–36.0)
MCV: 96.4 fL (ref 78.0–100.0)
Platelets: 131 10*3/uL — ABNORMAL LOW (ref 150–400)
RBC: 2.49 MIL/uL — ABNORMAL LOW (ref 4.22–5.81)
RDW: 16.6 % — AB (ref 11.5–15.5)
WBC: 20.1 10*3/uL — ABNORMAL HIGH (ref 4.0–10.5)

## 2014-12-26 LAB — BASIC METABOLIC PANEL
ANION GAP: 5 (ref 5–15)
BUN: 78 mg/dL — ABNORMAL HIGH (ref 6–23)
CO2: 20 mmol/L (ref 19–32)
Calcium: 7.5 mg/dL — ABNORMAL LOW (ref 8.4–10.5)
Chloride: 109 mmol/L (ref 96–112)
Creatinine, Ser: 1.59 mg/dL — ABNORMAL HIGH (ref 0.50–1.35)
GFR calc Af Amer: 51 mL/min — ABNORMAL LOW (ref >90)
GFR, EST NON AFRICAN AMERICAN: 44 mL/min — AB (ref >90)
Glucose, Bld: 127 mg/dL — ABNORMAL HIGH (ref 70–99)
Potassium: 4.5 mmol/L (ref 3.5–5.1)
Sodium: 134 mmol/L — ABNORMAL LOW (ref 135–145)

## 2014-12-26 LAB — PHOSPHORUS: PHOSPHORUS: 5 mg/dL — AB (ref 2.3–4.6)

## 2014-12-26 LAB — MAGNESIUM: Magnesium: 2.9 mg/dL — ABNORMAL HIGH (ref 1.5–2.5)

## 2014-12-26 MED ORDER — HEPARIN SODIUM (PORCINE) 5000 UNIT/ML IJ SOLN
5000.0000 [IU] | Freq: Three times a day (TID) | INTRAMUSCULAR | Status: DC
Start: 2014-12-26 — End: 2015-01-08
  Administered 2014-12-26 – 2015-01-08 (×39): 5000 [IU] via SUBCUTANEOUS
  Filled 2014-12-26 (×41): qty 1

## 2014-12-26 MED ORDER — ONDANSETRON HCL 4 MG/2ML IJ SOLN
4.0000 mg | Freq: Four times a day (QID) | INTRAMUSCULAR | Status: DC | PRN
  Administered 2014-12-26: 4 mg via INTRAVENOUS
  Filled 2014-12-26: qty 2

## 2014-12-26 MED ORDER — FAT EMULSION 20 % IV EMUL
240.0000 mL | INTRAVENOUS | Status: AC
  Administered 2014-12-26: 240 mL via INTRAVENOUS
  Filled 2014-12-26: qty 250

## 2014-12-26 MED ORDER — TRACE MINERALS CR-CU-F-FE-I-MN-MO-SE-ZN IV SOLN
INTRAVENOUS | Status: AC
  Administered 2014-12-26: 18:00:00 via INTRAVENOUS
  Filled 2014-12-26: qty 2640

## 2014-12-26 NOTE — Progress Notes (Signed)
Paged Dr. Donell BeersByerly to see if she still wants to hold heparin that was held d/t large amounts of drainage from eakin pouch. Awaiting call back.

## 2014-12-26 NOTE — Progress Notes (Signed)
PROGRESS NOTE  Kyle West ZOX:[id number] DOB: [birth year] DOA: 12/10/2014 PCP: Charmaine Downs, MD  Brief history 64yo male with hx HTN, CAD s/p CABG, cirrhosis of unclear etiology (followed at [facility]), reported recent hx pancreatitis. Presented to OSH 2/11 with 2 day hx worsening abd pain, nausea and vomiting. CT at outside hospital revealed pneumatosis, hepatic and portal venous gas and infrarenal AAA. He was tx to [facility] for further eval with concern for ischemic bowel. The patient was started on intravenous Unasyn and vancomycin for sepsis and peritonitis secondary to ischemic bowel. Gen. surgery was consulted. The patient underwent a laparotomy on 12/07/2014 with small bowel resection and application of wound VAC(Dr Cornett). Postoperatively, the patient was left intubated. He developed hypotension requiring central venous catheter and vasopressors. The patient was taken back to the OR on 12/08/2014 for a second look operation secondary to ischemia involving his ileum and small bowel resection. The small bowel remained viable with no signs of ischemia. He was weaned off of vasopressors and extubated on 12/20/2014. The patient's postoperative course was also complicated by atrial fibrillation with RVR. He was placed on amiodarone drip for short period of time which was d/ced on 2/16 . He has converted back to sinus rhythm. The patient was taken back to surgery on 12/19/2014 for dehiscence of his abdominal wall wound. After surgery on 2/18, he had another episode of Aflutter which has since converted back to sinus. Assessment/Plan: Ischemic bowel/peritonitis -appreciate surgery followup -POD #9/7--s/p ex lap with open abdomen and SBR, s/p relook and closure -POD#2--s/p ex lap and closure with retentions for dehiscence -npo except meds for now -TPN per surgery Wound Dehiscence -2/18--s/p repair Septic shock -Secondary to ischemic bowel/peritonitis -Remained stable off  vasopressors -Weaned off vasopressors 12/20/2014 -Continue IV Zosyn--last day was 12/17/2014  -12/24/14--WBC increase to 28.9--> bloodcultures 2 sets, UA/urine culture, -12/25/14--procalcitonin--9.58, lactic acid--1.7 -12/25/14--restarted abx Acute respiratory failure -Patient was intubated February 11>>> February 15 -presently stable on 2L-->100% saturations -CXR--bibasilar atelectasis; no consolidations Delirium/Acute Encephalopathy -multifactorial including sepsis, opioids, renal failure -minimize opioids if possible AKI -[place]akin output >3L in past 36 hours -Secondary to sepsis and fluid shifts in setting of hypotension -had hypotension after surgery 2/18 -continue IVF -seems to have hit plateau today PAF with RVR -had another episode 2/18 after surgery -Back in sinus -CHADS-VASc = 1 -weaned off amio drip on 2/16 -Change patient back to oral metoprolol tartrate when able to tolerate po  Hyperglycemia -Hemoglobin A1c--5.5 -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 -Patient follows with hepatology at [facility] -Restart lactulose -Restart rifaximin when able to take po -restart Actigall when able to take po -Ammonia 35-->27 Diarrhea -Cdiff neg on 2/16 Hypertension -Transition IV to po metoprolol tartrate when able to take po Deconditioning  -PT recommends CIR evaluation   Procedures/Studies: Dg Chest Port 1 View  12/24/2014   CLINICAL DATA:  Dyspnea, confusion, fever, history cirrhosis, coronary artery disease, hypertension, pancreatitis, former smoker  EXAM: PORTABLE CHEST - 1 VIEW  COMPARISON:  Portable exam 1326 hours compared to 12/19/2014  FINDINGS: Interval removal of endotracheal and nasogastric tubes.  LEFT jugular central venous catheter tip projects over SVC.  Normal heart size post CABG.  Mediastinal contours and pulmonary vascularity normal.  Decreased bibasilar atelectasis.  Linear subsegmental atelectasis LEFT upper lobe.  Remaining lungs clear.  No pleural effusion or  pneumothorax.  IMPRESSION: Decreased bibasilar and slightly increased LEFT upper lobe subsegmental atelectasis.   Electronically Signed   By: Ulyses Southward  M.D.   On: 12/24/2014 14:42   Dg Chest Portable 1 View  12/19/2014   CLINICAL DATA:  Respiratory failure.  EXAM: PORTABLE CHEST - 1 VIEW  COMPARISON:  12/24/2014  FINDINGS: Endotracheal tube is in place with tip just above the level of the carina. Nasogastric tube is in place with tip overlying the level of the stomach. Patient has had median sternotomy and CABG. Left IJ central line tip overlies the superior vena cava.  The heart is enlarged. There is dense opacity of the left lung base consistent with atelectasis or infiltrate. Small bilateral pleural effusions are identified. There has been some improvement in aeration of the right lung base.  IMPRESSION: 1. Postoperative lines and tubes. 2. Slightly improved aeration of the right lung base. 3. Persistent left lung base atelectasis and effusion.   Electronically Signed   By: Norva Pavlov M.D.   On: 12/19/2014 08:04   Dg Chest Port 1 View  12/09/2014   CLINICAL DATA:  Hypoxia/respiratory failure  EXAM: PORTABLE CHEST - 1 VIEW  COMPARISON:  December 17, 2014  FINDINGS: Endotracheal tube tip is 2.2 cm above the carina. Nasogastric tube tip and side port are in the stomach. Central catheter tip is in the superior vena cava. No pneumothorax. There is left lower lobe consolidation with small left effusion. There is a small right effusion with right base atelectasis. Heart is mildly enlarged. Patient is status post coronary artery bypass grafting. No adenopathy.  IMPRESSION: Tube and catheter positions as described without pneumothorax. Areas of consolidation with effusions. Heart prominent but stable.   Electronically Signed   By: Bretta Bang III M.D.   On: 12/30/2014 14:48   Dg Chest Port 1 View  12/17/2014   CLINICAL DATA:  Respiratory failure.  EXAM: PORTABLE CHEST - 1 VIEW  COMPARISON:  December 16, 2014.  FINDINGS: Stable cardiomediastinal silhouette. Endotracheal and nasogastric tubes are unchanged in position. Left internal jugular catheter is also noted with distal tip overlying expected position of the SVC. Status post coronary artery bypass graft. Bibasilar opacities are noted most consistent with subsegmental atelectasis. No pneumothorax is noted. No significant pleural effusion is noted.  IMPRESSION: Stable support apparatus. Stable bibasilar subsegmental atelectasis.   Electronically Signed   By: Lupita Raider, M.D.   On: 12/17/2014 07:53   Dg Chest Port 1 View  12/07/2014   CLINICAL DATA:  Status post central line placement  EXAM: PORTABLE CHEST - 1 VIEW  COMPARISON:  Chest film from earlier in the same day  FINDINGS: Postsurgical changes are again seen. An endotracheal tube is now noted 5.2 cm above the carina. A nasogastric catheter is seen extending into the stomach. A left jugular central line is noted with the tip in the mid superior vena cava. No pneumothorax is seen. Bibasilar atelectatic changes are again noted.  IMPRESSION: Tubes and lines as described above. No pneumothorax is noted. Stable bibasilar atelectatic changes are seen.   Electronically Signed   By: Alcide Clever M.D.   On: 12/22/2014 19:11   Dg Chest Port 1 View  12/27/2014   CLINICAL DATA:  Abdominal aortic aneurysm. Shortness of breath. Coronary artery disease. Pre-op respiratory exam  EXAM: PORTABLE CHEST - 1 VIEW  COMPARISON:  None.  FINDINGS: Low lung volumes are seen. Streaky opacity lung bases is likely due to atelectasis, although pneumonia cannot definitely be excluded. No evidence of pleural effusion. Heart size is within normal limits allowing for low lung volumes. Previous CABG noted.  IMPRESSION: Low lung volumes with probable bibasilar atelectasis, although pneumonia cannot definitely be excluded.   Electronically Signed   By: Myles RosenthalJohn  Stahl M.D.   On: 01/02/2015 13:16         Subjective: Pt is  intermittenly aggitated.  No vomiting, diarrhea, respiratory distress.  No f/c, hemoptysis.   Objective: Filed Vitals:   12/25/14 2311 12/26/14 0306 12/26/14 0720 12/26/14 1055  BP: 124/62 138/52 165/49 139/86  Pulse: 100 96 91 89  Temp: 98 F (36.7 C) 97.8 F (36.6 C) 98.2 F (36.8 C) 98.1 F (36.7 C)  TempSrc: Oral Axillary Oral Axillary  Resp: 26 22 23 21   Height:  6\' 1"  (1.854 m)    Weight:  107.9 kg (237 lb 14 oz)    SpO2: 100% 100% 100% 98%    Intake/Output Summary (Last 24 hours) at 12/26/14 1454 Last data filed at 12/26/14 1100  Gross per 24 hour  Intake   1220 ml  Output   5775 ml  Net  -4555 ml   Weight change: -2.3 kg (-5 lb 1.1 oz) Exam:   General:  Pt is alert, follows commands appropriately, not in acute distress  HEENT: No icterus, No thrush, [place]/AT  Cardiovascular: RRR, S1/S2, no rubs, no gallops  Respiratory: CTA bilaterally, no wheezing, no crackles, no rhonchi  Abdomen: Soft/+BS, diffusely tender without rebound;  non distended, no guarding  Extremities: No edema, No lymphangitis, No petechiae, No rashes, no synovitis  Data Reviewed: Basic Metabolic Panel:  Recent Labs Lab 12/20/14 0340  12/22/14 0540 12/14/2014 0340 12/24/14 0335 12/25/14 0500 12/26/14 1100  NA 133*  < > 135 132* 129* 130* 134*  K 4.2  < > 3.7 3.4* 4.6 4.8 4.5  CL 104  < > 106 102 101 106 109  CO2 26  < > 25 27 22 20 20   GLUCOSE 129*  < > 101* 125* 171* 135* 127*  BUN 40*  < > 38* 38* 51* 68* 78*  CREATININE 1.29  < > 1.13 1.04 1.52* 1.72* 1.59*  CALCIUM 7.3*  < > 8.1* 7.8* 7.5* 7.1* 7.5*  MG 2.4  --   --  2.9* 2.7* 3.0* 2.9*  PHOS 3.5  --   --  4.4 5.1* 6.0* 5.0*  < > = values in this interval not displayed. Liver Function Tests:  Recent Labs Lab 12/20/14 0340 12/28/2014 0340 12/25/14 0500  AST 50* 41* 62*  ALT 18 16 23   ALKPHOS 63 89 90  BILITOT 2.4* 3.2* 3.1*  PROT 4.4* 4.5* 4.2*  ALBUMIN 2.2* 2.1* 2.3*   No results for input(s): LIPASE, AMYLASE in the  last 168 hours.  Recent Labs Lab 12/21/14 1919 12/24/14 1411  AMMONIA 35* 27   CBC:  Recent Labs Lab 12/20/14 0340  01/02/2015 0340 12/24/14 1411 12/25/14 0500 12/25/14 1700 12/26/14 1240  WBC 11.7*  < > 11.0* 28.9* 26.8* 23.8* 20.1*  NEUTROABS 8.2*  --   --   --   --   --   --   HGB 9.5*  < > 10.1* 10.6* 9.0* 8.8* 8.5*  HCT 27.9*  < > 29.5* 29.3* 25.6* 25.1* 24.0*  MCV 101.8*  < > 96.4 95.8 95.9 98.0 96.4  PLT 82*  < > 102* 144* 136* 126* 131*  < > = values in this interval not displayed. Cardiac Enzymes: No results for input(s): CKTOTAL, CKMB, CKMBINDEX, TROPONINI in the last 168 hours. BNP: Invalid input(s): POCBNP CBG:  Recent Labs Lab 12/25/14 1928 12/25/14 2309  12/26/14 0308 12/26/14 0828 12/26/14 1233  GLUCAP 147* 158* 148* 144* 164*    Recent Results (from the past 240 hour(s))  Surgical pcr screen     Status: None   Collection Time: 01/14/2015  3:13 PM  Result Value Ref Range Status   MRSA, PCR NEGATIVE NEGATIVE Final   Staphylococcus aureus NEGATIVE NEGATIVE Final    Comment:        The Xpert SA Assay (FDA approved for NASAL specimens in patients over 70 years of age), is one component of a comprehensive surveillance program.  Test performance has been validated by [facility] for patients greater than or equal to 89 year old. It is not intended to diagnose infection nor to guide or monitor treatment.   Clostridium Difficile by PCR     Status: None   Collection Time: 12/21/14  3:32 AM  Result Value Ref Range Status   C difficile by pcr NEGATIVE NEGATIVE Final  Culture, blood (routine x 2)     Status: None (Preliminary result)   Collection Time: 12/25/14 11:00 AM  Result Value Ref Range Status   Specimen Description BLOOD LEFT HAND  Final   Special Requests BOTTLES DRAWN AEROBIC ONLY 7CC  Final   Culture   Final           BLOOD CULTURE RECEIVED NO GROWTH TO DATE CULTURE WILL BE HELD FOR 5 DAYS BEFORE ISSUING A FINAL NEGATIVE REPORT Performed  at [facility]    Report Status PENDING  Incomplete  Culture, blood (routine x 2)     Status: None (Preliminary result)   Collection Time: 12/25/14 11:10 AM  Result Value Ref Range Status   Specimen Description BLOOD LEFT HAND  Final   Special Requests BOTTLES DRAWN AEROBIC ONLY 5CC  Final   Culture   Final           BLOOD CULTURE RECEIVED NO GROWTH TO DATE CULTURE WILL BE HELD FOR 5 DAYS BEFORE ISSUING A FINAL NEGATIVE REPORT Performed at [facility]    Report Status PENDING  Incomplete     Scheduled Meds: . chlorhexidine  15 mL Mouth Rinse BID  . heparin subcutaneous  5,000 Units Subcutaneous 3 times per day  . insulin aspart  0-9 Units Subcutaneous TID WC  . lactulose  20 g Oral BID  . metoprolol  2.5 mg Intravenous Q6H  . piperacillin-tazobactam (ZOSYN)  IV  3.375 g Intravenous Q8H  . vancomycin  1,250 mg Intravenous Q24H   Continuous Infusions: . TPN (CLINIMIX) Adult without lytes 110 mL/hr at 12/26/14 1100   And  . fat emulsion 240 mL (12/26/14 1100)  . TPN (CLINIMIX) Adult without lytes     And  . fat emulsion    . sodium chloride 0.9 % 1,000 mL infusion 125 mL/hr at 12/24/14 1300     Zahmir Lalla, DO  [facility] Pager [phone]  If 7PM-7AM, please contact night-coverage [email or web address] Password [email or web address] 12/26/2014, 2:54 PM   LOS: 10 days

## 2014-12-26 NOTE — Progress Notes (Signed)
PARENTERAL NUTRITION CONSULT NOTE - FOLLOW UP  Pharmacy Consult for TPN Indication: Prolonged ileus  Allergies  Allergen Reactions  . Oxycodone Hives and Other (See Comments)    Hallucinations    Patient Measurements: Height:  (185.4 cm) Weight: 237 lb 14 oz (107.9 kg) IBW/kg (Calculated) : 79.9  Vital Signs: Temp: 98.1 F (36.7 C) (02/21 1055) Temp Source: Axillary (02/21 1055) BP: 139/86 mmHg (02/21 1055) Pulse Rate: 89 (02/21 1055) Intake/Output from previous day: 02/20 0701 - 02/21 0700 In: 2110 [I.V.:120; IV Piggyback:550; TPN:1440] Out: 4875 [Urine:925; Stool:3950] Intake/Output from this shift: Total I/O In: 570 [I.V.:40; IV Piggyback:50; TPN:480] Out: 1300 [Urine:250; Stool:1050]  Labs:  Recent Labs  12/24/14 1411 12/25/14 0500 12/25/14 1700  WBC 28.9* 26.8* 23.8*  HGB 10.6* 9.0* 8.8*  HCT 29.3* 25.6* 25.1*  PLT 144* 136* 126*     Recent Labs  12/24/14 0335 12/25/14 0500 12/26/14 1100  NA 129* 130* 134*  K 4.6 4.8 4.5  CL 101 106 109  CO2 GLUCOSE 171* 135* 127*  BUN 51* 68* 78*  CREATININE 1.52* 1.72* 1.59*  CALCIUM 7.5* 7.1* 7.5*  MG 2.7* 3.0* 2.9*  PHOS 5.1* 6.0* 5.0*  PROT  --  4.2*  --   ALBUMIN  --  2.3*  --   AST  --  62*  --   ALT  --  23  --   ALKPHOS  --  90  --   BILITOT  --  3.1*  --    Estimated Creatinine Clearance: 60.5 mL/min (by C-G formula based on Cr of 1.59).    Recent Labs  12/26/14 0308 12/26/14 0828 12/26/14 1233  GLUCAP 148* 144* 164*    Medications:  Scheduled:  . chlorhexidine  15 mL Mouth Rinse BID  . heparin subcutaneous  5,000 Units Subcutaneous 3 times per day  . insulin aspart  0-9 Units Subcutaneous TID WC  . lactulose  20 g Oral BID  . metoprolol  2.5 mg Intravenous Q6H  . piperacillin-tazobactam (ZOSYN)  IV  3.375 g Intravenous Q8H  . vancomycin  1,250 mg Intravenous Q24H    Insulin Requirements in the past 24 hours:  4 units sensitive SSI, 15 units insulin in  TPN  Admit:  64 year old man transferred from outside hospital for concern of ischemic bowel. He underwent surgery on 2/11 - exploratory laparotomy, small bowel resection and application of a wound vac. TPN to start in anticipation of prolonged bowel rest/ileus.   GI:  S/P ex. Lap with SBR on 2/11. Also history of pancreatitis and cirrhosis child pugh B- PTA: Lactulose, PPI, Rifaximin, spironolactione, Ursodiol. S/P side-to-side small bowel anastomosis and closure of abdomen on 2/13. Abd soft. NG d/c'd- no vomiting.On 2/18, bowel visible in [place]. POD #3 s/p ex lap with closure.  Had BM and is going to try clears per CCS.  Endo: No history of diabetes. CBGs 144-164  Lytes: K 4.5, Na 134, Mg 2.9, Phos 5 - lytes are stable today despite changing to electrolyte-free TPN  Renal: AKI s/p surgery. Scr 1.59 today. UOP 0.4 ml/kg/hr yesterday, hydrating with NS @ 125 mL/hr   Pulm:2L [place]   Cards: History of CAD. BP soft, HR wnl. Metoprolol IV  Hepatobil: history of cirrhosis/NASH. AST 41 , ALT wnl, Tbili up to 4.2, INR 1.67 (2/13). Baseline palb low at 6.4 & TG 54.  Ammonia 27, improved on lactulose  Neuro: Now with post anesthesia hepatic encephalopathy, On lactulose,  fent  prn, morphine prn   ID: Zosyn for intra-abdominal coverage (2/11>>), added Vanc d/t sepsis (2/20>). Afebrile, WBC up to 23.8, CDiff(-) 2/16, PCT 9.58, LA 1.7  Best Practices: SCDs, mouthcare, Hep SQ, PPI   TPN Access: Triple Lumen IJ (placed 2/11) TPN day#: 12/17/14 >>  Nutritional Goals: (per RD note on 2/12 - Thanks!) 1515-1840 kcal, >= 167g protein while on vent  2200-2400 kcal, 120-135g of protein per day when off vent  Current Nutrition:  Clear liquids ordered today Clinimix 5/15 at 15710ml/hr and Lipids at 3610ml/hr, providing 132g protein and 2354 kCal and meeting 100% of his goals  Plan:  Continue Clinimix 5/15 at 12310mL/hr with lipids at 6210mL/hr.   Continue with no electrolytes in  TPN. Continue SSI Daily MVI and TE  Continue 15 units insulin in TPN bag  F/U diet advancement, wean TPN as appropriate TPN labs in AM  Estella HuskMichelle Obdulio Mash, Pharm.D., BCPS, AAHIVP Clinical Pharmacist Phone: [phone] or [phone] 12/26/2014, 1:07 PM

## 2014-12-26 NOTE — Progress Notes (Signed)
Patient ID: Kyle West, male   DOB: Mar 04, 1951, 63 y.o.   MRN: [id number]  General Surgery [facility]  POD#: 01/11/10  Subjective: Patient confused, sleepy.    Objective: Vital signs in last 24 hours: Temp:  [97.2 F (36.2 C)-98.8 F (37.1 C)] 98.2 F (36.8 C) (02/21 0720) Pulse Rate:  [91-110] 91 (02/21 0720) Resp:  [22-29] 23 (02/21 0720) BP: (121-165)/(49-67) 165/49 mmHg (02/21 0720) SpO2:  [100 %] 100 % (02/21 0720) Weight:  [237 lb 14 oz (107.9 kg)] 237 lb 14 oz (107.9 kg) (02/21 0306) Last BM Date: 01/02/2015  Intake/Output from previous day: 02/20 0701 - 02/21 0700 In: 1990 [I.V.:120; IV Piggyback:550; TPN:1320] Out: 4875 [Urine:925; Stool:3950] Intake/Output this shift: Total I/O In: 10 [I.V.:10] Out: 900 [Urine:250; Stool:650]  Physical Exam: HEENT - sclerae clear, mucous membranes moist Neck - soft Chest - coarse bilaterally with expiratory wheezing Cor - tachycardic Abdomen - soft; midline wound intact with fascial retentions; pouch on wound with thin serosanguinous drainage, about 300 in pouch.  Lab Results:   Recent Labs  12/25/14 0500 12/25/14 1700  WBC 26.8* 23.8*  HGB 9.0* 8.8*  HCT 25.6* 25.1*  PLT 136* 126*   BMET  Recent Labs  12/24/14 0335 12/25/14 0500  NA 129* 130*  K 4.6 4.8  CL 101 106  CO2 22 20  GLUCOSE 171* 135*  BUN 51* 68*  CREATININE 1.52* 1.72*  CALCIUM 7.5* 7.1*   PT/INR No results for input(s): LABPROT, INR in the last 72 hours. Comprehensive Metabolic Panel:    Component Value Date/Time   NA 130* 12/25/2014 0500   NA 129* 12/24/2014 0335   K 4.8 12/25/2014 0500   K 4.6 12/24/2014 0335   CL 106 12/25/2014 0500   CL 101 12/24/2014 0335   CO2 20 12/25/2014 0500   CO2 22 12/24/2014 0335   BUN 68* 12/25/2014 0500   BUN 51* 12/24/2014 0335   CREATININE 1.72* 12/25/2014 0500   CREATININE 1.52* 12/24/2014 0335   GLUCOSE 135* 12/25/2014 0500   GLUCOSE 171* 12/24/2014 0335   CALCIUM 7.1*  12/25/2014 0500   CALCIUM 7.5* 12/24/2014 0335   AST 62* 12/25/2014 0500   AST 41* 12/16/2014 0340   ALT 23 12/25/2014 0500   ALT 16 12/21/2014 0340   ALKPHOS 90 12/25/2014 0500   ALKPHOS 89 12/19/2014 0340   BILITOT 3.1* 12/25/2014 0500   BILITOT 3.2* 12/16/2014 0340   PROT 4.2* 12/25/2014 0500   PROT 4.5* 12/11/2014 0340   ALBUMIN 2.3* 12/25/2014 0500   ALBUMIN 2.1* 12/27/2014 0340    Studies/Results: Dg Chest Port 1 View  12/24/2014   CLINICAL DATA:  Dyspnea, confusion, fever, history cirrhosis, coronary artery disease, hypertension, pancreatitis, former smoker  EXAM: PORTABLE CHEST - 1 VIEW  COMPARISON:  Portable exam 1326 hours compared to 12/19/2014  FINDINGS: Interval removal of endotracheal and nasogastric tubes.  LEFT jugular central venous catheter tip projects over SVC.  Normal heart size post CABG.  Mediastinal contours and pulmonary vascularity normal.  Decreased bibasilar atelectasis.  Linear subsegmental atelectasis LEFT upper lobe.  Remaining lungs clear.  No pleural effusion or pneumothorax.  IMPRESSION: Decreased bibasilar and slightly increased LEFT upper lobe subsegmental atelectasis.   Electronically Signed   By: Ulyses Southward M.D.   On: 12/24/2014 14:42    Anti-infectives: Anti-infectives    Start     Dose/Rate Route Frequency Ordered Stop   12/26/14 0930  vancomycin (VANCOCIN) 1,250 mg in sodium chloride 0.9 % 250  mL IVPB     1,250 mg 166.7 mL/hr over 90 Minutes Intravenous Every 24 hours 12/25/14 0912     12/25/14 1000  piperacillin-tazobactam (ZOSYN) IVPB 3.375 g     3.375 g 12.5 mL/hr over 240 Minutes Intravenous Every 8 hours 12/25/14 0921     12/25/14 0930  vancomycin (VANCOCIN) 1,750 mg in sodium chloride 0.9 % 500 mL IVPB     1,750 mg 250 mL/hr over 120 Minutes Intravenous  Once 12/25/14 0912 12/25/14 1208   12/25/14 0900  piperacillin-tazobactam (ZOSYN) IVPB 3.375 g  Status:  Discontinued     3.375 g 100 mL/hr over 30 Minutes Intravenous 3 times per day  12/25/14 0852 12/25/14 0921   12/23/2014 2200  [MAR Hold]  rifaximin (XIFAXAN) tablet 550 mg  Status:  Discontinued     (MAR Hold since 01/01/2015 1721)   550 mg Oral 2 times daily 12/29/2014 1634 01/01/2015 1726   12/29/2014 1430  piperacillin-tazobactam (ZOSYN) IVPB 3.375 g     3.375 g 12.5 mL/hr over 240 Minutes Intravenous 3 times per day 12/17/2014 1403 12/24/14 0347      Assessment & Plans: Status post small bowel resection for ischemia, fascial dehiscence  Continue TNA  Given BM, will let have clears today.  Wound care, suspect ascites leaking from wound.  Would not take back for possible dehiscence given the fact that wound still intact and ascites is major issue either way.  Suspect that is why he is less distended today.    Sedation/haldol/sleep disturbances per medicine  Reviewed fluid status given marked leakage of ascites.  Would hold off on albumin or other fluid replacement since he is still markedly positive (13 liters positive since admission)   [facility] Office: [phone]   [facility] Medical CenterBYERLY,Xitlali Kastens 12/26/2014

## 2014-12-27 LAB — COMPREHENSIVE METABOLIC PANEL
ALT: 30 U/L (ref 0–53)
ANION GAP: 4 — AB (ref 5–15)
AST: 71 U/L — ABNORMAL HIGH (ref 0–37)
Albumin: 1.9 g/dL — ABNORMAL LOW (ref 3.5–5.2)
Alkaline Phosphatase: 112 U/L (ref 39–117)
BUN: 78 mg/dL — ABNORMAL HIGH (ref 6–23)
CO2: 18 mmol/L — ABNORMAL LOW (ref 19–32)
Calcium: 7.2 mg/dL — ABNORMAL LOW (ref 8.4–10.5)
Chloride: 108 mmol/L (ref 96–112)
Creatinine, Ser: 1.68 mg/dL — ABNORMAL HIGH (ref 0.50–1.35)
GFR, EST AFRICAN AMERICAN: 48 mL/min — AB (ref >90)
GFR, EST NON AFRICAN AMERICAN: 41 mL/min — AB (ref >90)
Glucose, Bld: 125 mg/dL — ABNORMAL HIGH (ref 70–99)
POTASSIUM: 4.1 mmol/L (ref 3.5–5.1)
SODIUM: 130 mmol/L — AB (ref 135–145)
Total Bilirubin: 2.8 mg/dL — ABNORMAL HIGH (ref 0.3–1.2)
Total Protein: 4.7 g/dL — ABNORMAL LOW (ref 6.0–8.3)

## 2014-12-27 LAB — CBC
HEMATOCRIT: 24.9 % — AB (ref 39.0–52.0)
Hemoglobin: 8.7 g/dL — ABNORMAL LOW (ref 13.0–17.0)
MCH: 33.6 pg (ref 26.0–34.0)
MCHC: 34.9 g/dL (ref 30.0–36.0)
MCV: 96.1 fL (ref 78.0–100.0)
PLATELETS: 145 10*3/uL — AB (ref 150–400)
RBC: 2.59 MIL/uL — AB (ref 4.22–5.81)
RDW: 16.7 % — AB (ref 11.5–15.5)
WBC: 20.5 10*3/uL — ABNORMAL HIGH (ref 4.0–10.5)

## 2014-12-27 LAB — DIFFERENTIAL
Basophils Absolute: 0.1 10*3/uL (ref 0.0–0.1)
Basophils Relative: 0 % (ref 0–1)
EOS PCT: 4 % (ref 0–5)
Eosinophils Absolute: 0.8 10*3/uL — ABNORMAL HIGH (ref 0.0–0.7)
Lymphocytes Relative: 5 % — ABNORMAL LOW (ref 12–46)
Lymphs Abs: 1.1 10*3/uL (ref 0.7–4.0)
Monocytes Absolute: 2.3 10*3/uL — ABNORMAL HIGH (ref 0.1–1.0)
Monocytes Relative: 11 % (ref 3–12)
NEUTROS ABS: 16.2 10*3/uL — AB (ref 1.7–7.7)
Neutrophils Relative %: 80 % — ABNORMAL HIGH (ref 43–77)

## 2014-12-27 LAB — URINALYSIS, ROUTINE W REFLEX MICROSCOPIC
Bilirubin Urine: NEGATIVE
Glucose, UA: NEGATIVE mg/dL
Hgb urine dipstick: NEGATIVE
KETONES UR: NEGATIVE mg/dL
Leukocytes, UA: NEGATIVE
Nitrite: NEGATIVE
Protein, ur: NEGATIVE mg/dL
Specific Gravity, Urine: 1.015 (ref 1.005–1.030)
Urobilinogen, UA: 0.2 mg/dL (ref 0.0–1.0)
pH: 5.5 (ref 5.0–8.0)

## 2014-12-27 LAB — GLUCOSE, CAPILLARY
GLUCOSE-CAPILLARY: 130 mg/dL — AB (ref 70–99)
GLUCOSE-CAPILLARY: 107 mg/dL — AB (ref 70–99)
GLUCOSE-CAPILLARY: 93 mg/dL (ref 70–99)
Glucose-Capillary: 128 mg/dL — ABNORMAL HIGH (ref 70–99)
Glucose-Capillary: 181 mg/dL — ABNORMAL HIGH (ref 70–99)
Glucose-Capillary: 136 mg/dL — ABNORMAL HIGH (ref 70–99)

## 2014-12-27 LAB — MAGNESIUM: Magnesium: 2.7 mg/dL — ABNORMAL HIGH (ref 1.5–2.5)

## 2014-12-27 LAB — CREATININE, URINE, RANDOM: Creatinine, Urine: 67.13 mg/dL

## 2014-12-27 LAB — TRIGLYCERIDES: TRIGLYCERIDES: 56 mg/dL (ref <150)

## 2014-12-27 LAB — PREALBUMIN: Prealbumin: 3.5 mg/dL — ABNORMAL LOW (ref 17.0–34.0)

## 2014-12-27 LAB — SODIUM, URINE, RANDOM: Sodium, Ur: <10 mmol/L

## 2014-12-27 LAB — PROCALCITONIN: Procalcitonin: 10.29 ng/mL

## 2014-12-27 LAB — PHOSPHORUS: Phosphorus: 4.4 mg/dL (ref 2.3–4.6)

## 2014-12-27 MED ORDER — FAT EMULSION 20 % IV EMUL
240.0000 mL | INTRAVENOUS | Status: AC
  Administered 2014-12-27: 240 mL via INTRAVENOUS
  Filled 2014-12-27: qty 250

## 2014-12-27 MED ORDER — TRACE MINERALS CR-CU-F-FE-I-MN-MO-SE-ZN IV SOLN
INTRAVENOUS | Status: AC
  Administered 2014-12-27: 17:00:00 via INTRAVENOUS
  Filled 2014-12-27: qty 2640

## 2014-12-27 MED ORDER — SODIUM CHLORIDE 0.9 % IV SOLN
INTRAVENOUS | Status: DC
  Administered 2014-12-27 – 2014-12-29 (×3): via INTRAVENOUS

## 2014-12-27 MED ORDER — BOOST / RESOURCE BREEZE PO LIQD
1.0000 | Freq: Three times a day (TID) | ORAL | Status: DC
  Administered 2014-12-27 – 2014-12-28 (×4): 1 via ORAL

## 2014-12-27 NOTE — Progress Notes (Signed)
Inpatient Rehabilitation  I note Dr. Riley KillSwartz' recommendations for IP rehab and [facility]  I stopped in to see pt. however he was sleeping and I could not locate wife in the waiting area.  I phoned her and left a voice mail.  I will follow up tomorrow .  Please call if questions.  Weldon PickingSusan Heydy Montilla PT Inpatient Rehab Admissions Coordinator Cell [phone] Office [phone]

## 2014-12-27 NOTE — Progress Notes (Signed)
SLP Cancellation Note  Patient Details Name: Romana Juniperathaniel Leever MRN: [id number] DOB: 1951/08/19   Cancelled treatment:       Reason Eval/Treat Not Completed: Fatigue/lethargy limiting ability to participate (arousable but quickly falls back to sleep. )  Ferdinand LangoLeah Jethro Radke MA, CCC-SLP [phone]  Ferdinand LangoMcCoy Kinnick Maus Meryl 12/27/2014, 9:51 AM

## 2014-12-27 NOTE — Progress Notes (Signed)
Physical Therapy Treatment Patient Details Name: Kyle West MRN: [id number] DOB: Jan 03, 1951 Today's Date: 12/27/2014    History of Present Illness pt presents with Ischemic Bowel and has had an ex lap with re-exploration, followed by dehisence and back to OR for closure, but with drain.  Marland Kitchen.  pt with hx of Cirrhosis.      PT Comments    Pt lethargic, but able to be aroused and participate with mobility, even stating he wanted to get up.  Once pt in chair quickly falls asleep.  Upon arrival pt's drain not connected to Eakin's pouch, RN made aware.  Continue to feel pt is appropriate for CIR, will continue to follow.    Follow Up Recommendations  CIR     Equipment Recommendations  None recommended by PT    Recommendations for Other Services       Precautions / Restrictions Precautions Precautions: Fall Precaution Comments: Abdominal incisions. Restrictions Weight Bearing Restrictions: No    Mobility  Bed Mobility Overal bed mobility: Needs Assistance Bed Mobility: Rolling;Sidelying to Sit Rolling: Mod assist Sidelying to sit: Mod assist;+2 for physical assistance       General bed mobility comments: pt needs cues for step-by-step participation, but does participate with mobility.  Grimacing during bed mobility, but denied pain when asked.    Transfers Overall transfer level: Needs assistance Equipment used: 2 person hand held assist Transfers: Sit to/from UGI CorporationStand;Stand Pivot Transfers Sit to Stand: Mod assist;+2 physical assistance Stand pivot transfers: Mod assist;+2 physical assistance       General transfer comment: pt able to participate with coming to stand and pivots to chair, but needs simple step-by-step directions to help him stay on task.    Ambulation/Gait                 Stairs            Wheelchair Mobility    Modified Rankin (Stroke Patients Only)       Balance Overall balance assessment: Needs assistance Sitting-balance  support: Bilateral upper extremity supported;Feet supported Sitting balance-Leahy Scale: Poor Sitting balance - Comments: pt leans on UEs and does use them for support.     Standing balance support: During functional activity Standing balance-Leahy Scale: Poor                      Cognition Arousal/Alertness: Lethargic Behavior During Therapy: Flat affect Overall Cognitive Status: Impaired/Different from baseline Area of Impairment: Orientation;Attention;Memory;Following commands;Safety/judgement;Awareness;Problem solving Orientation Level: Disoriented to;Place;Time;Situation Current Attention Level: Focused Memory: Decreased short-term memory;Decreased recall of precautions Following Commands: Follows one step commands with increased time;Follows multi-step commands inconsistently Safety/Judgement: Decreased awareness of safety;Decreased awareness of deficits Awareness: Intellectual Problem Solving: Decreased initiation;Slow processing;Difficulty sequencing;Requires verbal cues;Requires tactile cues General Comments: pt with decreased arousal and continued decreased cognition as compared to prior to last surgery.  pt needs frequent cueing to attend to task, but does follow most simple directions.      Exercises      General Comments        Pertinent Vitals/Pain Pain Assessment: Faces Faces Pain Scale: Hurts little more Pain Location: During mobility grimaces, but denied pain when asked. Pain Descriptors / Indicators: Grimacing Pain Intervention(s): Monitored during session;Repositioned    Home Living                      Prior Function            PT Goals (current goals can  now be found in the care plan section) Acute Rehab PT Goals Patient Stated Goal: to not hurt PT Goal Formulation: With patient/family Time For Goal Achievement: 01/05/15 Potential to Achieve Goals: Good Progress towards PT goals: Progressing toward goals    Frequency  Min  3X/week    PT Plan Current plan remains appropriate    Co-evaluation             End of Session Equipment Utilized During Treatment: Gait belt Activity Tolerance: Patient limited by fatigue;Patient limited by lethargy Patient left: in chair;with call bell/phone within reach     Time: 8119-1478 PT Time Calculation (min) (ACUTE ONLY): 31 min  Charges:  $Therapeutic Activity: 23-37 mins                    G CodesSunny Schlein, [name] [phone] 12/27/2014, 10:03 AM

## 2014-12-27 NOTE — Progress Notes (Signed)
Patient ID: Kyle West, male   DOB: [birth year], 64 y.o.   MRN: [id number]     [address] [place], [place] [address]    Phone: [phone] FAX: [phone]     Subjective: 3.5L out.  VSS.  Afebrile.  Up in chair.    Objective:  Vital signs:  Filed Vitals:   12/27/14 0724 12/27/14 0725 12/27/14 0726 12/27/14 0727  BP: 139/52     Pulse: 90 85 90 91  Temp:      TempSrc:      Resp: _0 Height:      Weight:      SpO2: 99% 100% 100% 100%    Last BM Date: 12/31/2014  Intake/Output   Yesterday:  02/21 0701 - 02/22 0700 In: 95284 [P.O.:33333; I.V.:40; IV Piggyback:350; TPN:1440] Out: 1324 [Urine:550; Stool:3500] This shift:  Total I/O In: 240 [TPN:240] Out: 300 [Urine:300]  Physical Exam: General: Pt awake/alert oriented to self.   Abdomen: Soft.  Nondistended.  Midline wound with retention sutures in place, pink, large amount of drainage into eakin's pouch which is serosanguinous.   No evidence of peritonitis.  No incarcerated hernias.    Problem List:   Active Problems:   Abdominal pain   Ischemic bowel disease   Acute respiratory failure   Sepsis   AKI (acute kidney injury)   Peritonitis   Acute respiratory failure, unspecified whether with hypoxia or hypercapnia    Results:   Labs: Results for orders placed or performed during the hospital encounter of 12/15/2014 (from the past 48 hour(s))  Culture, blood (routine x 2)     Status: None (Preliminary result)   Collection Time: 12/25/14 11:00 AM  Result Value Ref Range   Specimen Description BLOOD LEFT HAND    Special Requests BOTTLES DRAWN AEROBIC ONLY Lane    Culture             BLOOD CULTURE RECEIVED NO GROWTH TO DATE CULTURE WILL BE HELD FOR 5 DAYS BEFORE ISSUING A FINAL NEGATIVE REPORT Performed at [facility]    Report Status PENDING   Urine culture     Status: None   Collection Time: 12/25/14 11:00 AM  Result  Value Ref Range   Specimen Description URINE, RANDOM    Special Requests NONE    Colony Count      1,000 COLONIES/ML Performed at [facility]    Culture      INSIGNIFICANT GROWTH Performed at [facility]    Report Status 12/26/2014 FINAL   Urinalysis, Routine w reflex microscopic     Status: None   Collection Time: 12/25/14 11:00 AM  Result Value Ref Range   Color, Urine YELLOW YELLOW   APPearance CLEAR CLEAR   Specific Gravity, Urine 1.015 1.005 - 1.030   pH 5.5 5.0 - 8.0   Glucose, UA NEGATIVE NEGATIVE mg/dL   Hgb urine dipstick NEGATIVE NEGATIVE   Bilirubin Urine NEGATIVE NEGATIVE   Ketones, ur NEGATIVE NEGATIVE mg/dL   Protein, ur NEGATIVE NEGATIVE mg/dL   Urobilinogen, UA 0.2 0.0 - 1.0 mg/dL   Nitrite NEGATIVE NEGATIVE   Leukocytes, UA NEGATIVE NEGATIVE    Comment: MICROSCOPIC NOT DONE ON URINES WITH NEGATIVE PROTEIN, BLOOD, LEUKOCYTES, NITRITE, OR GLUCOSE <1000 mg/dL.  Procalcitonin - Baseline     Status: None   Collection Time: 12/25/14 11:00 AM  Result Value Ref Range   Procalcitonin 9.58 ng/mL  Comment:        Interpretation: PCT > 2 ng/mL: Systemic infection (sepsis) is likely, unless other causes are known. (NOTE)         ICU PCT Algorithm               Non ICU PCT Algorithm    ----------------------------     ------------------------------         PCT < 0.25 ng/mL                 PCT < 0.1 ng/mL     Stopping of antibiotics            Stopping of antibiotics       strongly encouraged.               strongly encouraged.    ----------------------------     ------------------------------       PCT level decrease by               PCT < 0.25 ng/mL       >= 80% from peak PCT       OR PCT 0.25 - 0.5 ng/mL          Stopping of antibiotics                                             encouraged.     Stopping of antibiotics           encouraged.    ----------------------------     ------------------------------       PCT level decrease by               PCT >= 0.25 ng/mL       < 80% from peak PCT        AND PCT >= 0.5 ng/mL            Continuing antibiotics                                               encouraged.       Continuing antibiotics            encouraged.    ----------------------------     ------------------------------     PCT level increase compared          PCT > 0.5 ng/mL         with peak PCT AND          PCT >= 0.5 ng/mL             Escalation of antibiotics                                          strongly encouraged.      Escalation of antibiotics        strongly encouraged.   Culture, blood (routine x 2)     Status: None (Preliminary result)   Collection Time: 12/25/14 11:10 AM  Result Value Ref Range   Specimen Description BLOOD LEFT HAND    Special Requests BOTTLES DRAWN AEROBIC ONLY 5CC    Culture  BLOOD CULTURE RECEIVED NO GROWTH TO DATE CULTURE WILL BE HELD FOR 5 DAYS BEFORE ISSUING A FINAL NEGATIVE REPORT Performed at [facility]    Report Status PENDING   Lactic acid, plasma     Status: None   Collection Time: 12/25/14 11:10 AM  Result Value Ref Range   Lactic Acid, Venous 1.7 0.5 - 2.0 mmol/L  Glucose, capillary     Status: Abnormal   Collection Time: 12/25/14 11:22 AM  Result Value Ref Range   Glucose-Capillary 149 (H) 70 - 99 mg/dL   Comment 1 Notify RN    Comment 2 Documented in Char   Lactic acid, plasma     Status: None   Collection Time: 12/25/14  1:10 PM  Result Value Ref Range   Lactic Acid, Venous 1.7 0.5 - 2.0 mmol/L  Glucose, capillary     Status: Abnormal   Collection Time: 12/25/14  3:40 PM  Result Value Ref Range   Glucose-Capillary 144 (H) 70 - 99 mg/dL   Comment 1 Notify RN    Comment 2 Documented in Char   Blood gas, arterial     Status: Abnormal   Collection Time: 12/25/14  4:45 PM  Result Value Ref Range   O2 Content 2.0 L/min   Delivery systems NASAL CANNULA    pH, Arterial 7.358 7.350 - 7.450   pCO2 arterial 31.2 (L) 35.0 - 45.0 mmHg   pO2,  Arterial 82.0 80.0 - 100.0 mmHg   Bicarbonate 17.1 (L) 20.0 - 24.0 mEq/L   TCO2 18.1 0 - 100 mmol/L   Acid-base deficit 7.3 (H) 0.0 - 2.0 mmol/L   O2 Saturation 94.7 %   Patient temperature 98.6    Collection site RIGHT RADIAL    Drawn by [phone]    Sample type ARTERIAL    Allens test (pass/fail) PASS PASS  CBC     Status: Abnormal   Collection Time: 12/25/14  5:00 PM  Result Value Ref Range   WBC 23.8 (H) 4.0 - 10.5 K/uL   RBC 2.56 (L) 4.22 - 5.81 MIL/uL   Hemoglobin 8.8 (L) 13.0 - 17.0 g/dL   HCT 25.1 (L) 39.0 - 52.0 %   MCV 98.0 78.0 - 100.0 fL   MCH 34.4 (H) 26.0 - 34.0 pg   MCHC 35.1 30.0 - 36.0 g/dL   RDW 16.8 (H) 11.5 - 15.5 %   Platelets 126 (L) 150 - 400 K/uL  Glucose, capillary     Status: Abnormal   Collection Time: 12/25/14  7:28 PM  Result Value Ref Range   Glucose-Capillary 147 (H) 70 - 99 mg/dL  Glucose, capillary     Status: Abnormal   Collection Time: 12/25/14 11:09 PM  Result Value Ref Range   Glucose-Capillary 158 (H) 70 - 99 mg/dL  Glucose, capillary     Status: Abnormal   Collection Time: 12/26/14  3:08 AM  Result Value Ref Range   Glucose-Capillary 148 (H) 70 - 99 mg/dL  Glucose, capillary     Status: Abnormal   Collection Time: 12/26/14  8:28 AM  Result Value Ref Range   Glucose-Capillary 144 (H) 70 - 99 mg/dL   Comment 1 Notify RN    Comment 2 Documented in Char   Basic metabolic panel     Status: Abnormal   Collection Time: 12/26/14 11:00 AM  Result Value Ref Range   Sodium 134 (L) 135 - 145 mmol/L   Potassium 4.5 3.5 - 5.1 mmol/L   Chloride 109 96 -  112 mmol/L   CO2 20 19 - 32 mmol/L   Glucose, Bld 127 (H) 70 - 99 mg/dL   BUN 78 (H) 6 - 23 mg/dL   Creatinine, Ser 1.59 (H) 0.50 - 1.35 mg/dL   Calcium 7.5 (L) 8.4 - 10.5 mg/dL   GFR calc non Af Amer 44 (L) >90 mL/min   GFR calc Af Amer 51 (L) >90 mL/min    Comment: (NOTE) The eGFR has been calculated using the CKD EPI equation. This calculation has not been validated in all clinical  situations. eGFR's persistently <90 mL/min signify possible Chronic Kidney Disease.    Anion gap 5 5 - 15  Magnesium     Status: Abnormal   Collection Time: 12/26/14 11:00 AM  Result Value Ref Range   Magnesium 2.9 (H) 1.5 - 2.5 mg/dL  Phosphorus     Status: Abnormal   Collection Time: 12/26/14 11:00 AM  Result Value Ref Range   Phosphorus 5.0 (H) 2.3 - 4.6 mg/dL  Glucose, capillary     Status: Abnormal   Collection Time: 12/26/14 12:33 PM  Result Value Ref Range   Glucose-Capillary 164 (H) 70 - 99 mg/dL   Comment 1 Notify RN    Comment 2 Documented in Char   CBC     Status: Abnormal   Collection Time: 12/26/14 12:40 PM  Result Value Ref Range   WBC 20.1 (H) 4.0 - 10.5 K/uL   RBC 2.49 (L) 4.22 - 5.81 MIL/uL   Hemoglobin 8.5 (L) 13.0 - 17.0 g/dL   HCT 24.0 (L) 39.0 - 52.0 %   MCV 96.4 78.0 - 100.0 fL   MCH 34.1 (H) 26.0 - 34.0 pg   MCHC 35.4 30.0 - 36.0 g/dL   RDW 16.6 (H) 11.5 - 15.5 %   Platelets 131 (L) 150 - 400 K/uL  Glucose, capillary     Status: Abnormal   Collection Time: 12/26/14  3:13 PM  Result Value Ref Range   Glucose-Capillary 128 (H) 70 - 99 mg/dL   Comment 1 Notify RN    Comment 2 Documented in Char   Glucose, capillary     Status: Abnormal   Collection Time: 12/26/14  7:13 PM  Result Value Ref Range   Glucose-Capillary 137 (H) 70 - 99 mg/dL  Glucose, capillary     Status: Abnormal   Collection Time: 12/26/14 11:06 PM  Result Value Ref Range   Glucose-Capillary 151 (H) 70 - 99 mg/dL  Glucose, capillary     Status: Abnormal   Collection Time: 12/27/14  4:36 AM  Result Value Ref Range   Glucose-Capillary 130 (H) 70 - 99 mg/dL  Comprehensive metabolic panel     Status: Abnormal   Collection Time: 12/27/14  4:37 AM  Result Value Ref Range   Sodium 130 (L) 135 - 145 mmol/L   Potassium 4.1 3.5 - 5.1 mmol/L   Chloride 108 96 - 112 mmol/L   CO2 18 (L) 19 - 32 mmol/L   Glucose, Bld 125 (H) 70 - 99 mg/dL   BUN 78 (H) 6 - 23 mg/dL   Creatinine, Ser 1.68  (H) 0.50 - 1.35 mg/dL   Calcium 7.2 (L) 8.4 - 10.5 mg/dL   Total Protein 4.7 (L) 6.0 - 8.3 g/dL   Albumin 1.9 (L) 3.5 - 5.2 g/dL   AST 71 (H) 0 - 37 U/L   ALT 30 0 - 53 U/L   Alkaline Phosphatase 112 39 - 117 U/L   Total Bilirubin 2.8 (H)  0.3 - 1.2 mg/dL   GFR calc non Af Amer 41 (L) >90 mL/min   GFR calc Af Amer 48 (L) >90 mL/min    Comment: (NOTE) The eGFR has been calculated using the CKD EPI equation. This calculation has not been validated in all clinical situations. eGFR's persistently <90 mL/min signify possible Chronic Kidney Disease.    Anion gap 4 (L) 5 - 15  Magnesium     Status: Abnormal   Collection Time: 12/27/14  4:37 AM  Result Value Ref Range   Magnesium 2.7 (H) 1.5 - 2.5 mg/dL  Phosphorus     Status: None   Collection Time: 12/27/14  4:37 AM  Result Value Ref Range   Phosphorus 4.4 2.3 - 4.6 mg/dL  CBC     Status: Abnormal   Collection Time: 12/27/14  4:37 AM  Result Value Ref Range   WBC 20.5 (H) 4.0 - 10.5 K/uL   RBC 2.59 (L) 4.22 - 5.81 MIL/uL   Hemoglobin 8.7 (L) 13.0 - 17.0 g/dL   HCT 24.9 (L) 39.0 - 52.0 %   MCV 96.1 78.0 - 100.0 fL   MCH 33.6 26.0 - 34.0 pg   MCHC 34.9 30.0 - 36.0 g/dL   RDW 16.7 (H) 11.5 - 15.5 %   Platelets 145 (L) 150 - 400 K/uL  Differential     Status: Abnormal   Collection Time: 12/27/14  4:37 AM  Result Value Ref Range   Neutrophils Relative % 80 (H) 43 - 77 %   Neutro Abs 16.2 (H) 1.7 - 7.7 K/uL   Lymphocytes Relative 5 (L) 12 - 46 %   Lymphs Abs 1.1 0.7 - 4.0 K/uL   Monocytes Relative 11 3 - 12 %   Monocytes Absolute 2.3 (H) 0.1 - 1.0 K/uL   Eosinophils Relative 4 0 - 5 %   Eosinophils Absolute 0.8 (H) 0.0 - 0.7 K/uL   Basophils Relative 0 0 - 1 %   Basophils Absolute 0.1 0.0 - 0.1 K/uL  Triglycerides     Status: None   Collection Time: 12/27/14  4:37 AM  Result Value Ref Range   Triglycerides 56 <150 mg/dL  Procalcitonin     Status: None   Collection Time: 12/27/14  4:37 AM  Result Value Ref Range    Procalcitonin 10.29 ng/mL    Comment:        Interpretation: PCT >= 10 ng/mL: Important systemic inflammatory response, almost exclusively due to severe bacterial sepsis or septic shock. (NOTE)         ICU PCT Algorithm               Non ICU PCT Algorithm    ----------------------------     ------------------------------         PCT < 0.25 ng/mL                 PCT < 0.1 ng/mL     Stopping of antibiotics            Stopping of antibiotics       strongly encouraged.               strongly encouraged.    ----------------------------     ------------------------------       PCT level decrease by               PCT < 0.25 ng/mL       >= 80% from peak PCT       OR PCT  0.25 - 0.5 ng/mL          Stopping of antibiotics                                             encouraged.     Stopping of antibiotics           encouraged.    ----------------------------     ------------------------------       PCT level decrease by              PCT >= 0.25 ng/mL       < 80% from peak PCT        AND PCT >= 0.5 ng/mL             Continuing antibiotics                                              encouraged.       Continuing antibiotics            encouraged.    ----------------------------     ------------------------------     PCT level increase compared          PCT > 0.5 ng/mL         with peak PCT AND          PCT >= 0.5 ng/mL             Escalation of antibiotics                                          strongly encouraged.      Escalation of antibiotics        strongly encouraged.     Imaging / Studies: No results found.  Medications / Allergies:  Scheduled Meds: . chlorhexidine  15 mL Mouth Rinse BID  . heparin subcutaneous  5,000 Units Subcutaneous 3 times per day  . insulin aspart  0-9 Units Subcutaneous TID WC  . lactulose  20 g Oral BID  . metoprolol  2.5 mg Intravenous Q6H  . piperacillin-tazobactam (ZOSYN)  IV  3.375 g Intravenous Q8H  . vancomycin  1,250 mg Intravenous Q24H    Continuous Infusions: . TPN (CLINIMIX) Adult without lytes 110 mL/hr at 12/27/14 0900   And  . fat emulsion 10 kcal (12/27/14 0900)  . TPN (CLINIMIX) Adult without lytes     And  . fat emulsion    . sodium chloride 0.9 % 1,000 mL infusion 125 mL/hr at 12/24/14 1300   PRN Meds:.fentaNYL, haloperidol lactate, levalbuterol, ondansetron (ZOFRAN) IV, white petrolatum  Antibiotics: Anti-infectives    Start     Dose/Rate Route Frequency Ordered Stop   12/26/14 0930  vancomycin (VANCOCIN) 1,250 mg in sodium chloride 0.9 % 250 mL IVPB     1,250 mg 166.7 mL/hr over 90 Minutes Intravenous Every 24 hours 12/25/14 0912     12/25/14 1000  piperacillin-tazobactam (ZOSYN) IVPB 3.375 g     3.375 g 12.5 mL/hr over 240 Minutes Intravenous Every 8 hours 12/25/14 0921     12/25/14 0930  vancomycin (VANCOCIN) 1,750 mg in sodium chloride 0.9 % 500 mL IVPB  1,750 mg 250 mL/hr over 120 Minutes Intravenous  Once 12/25/14 0912 12/25/14 1208   12/25/14 0900  piperacillin-tazobactam (ZOSYN) IVPB 3.375 g  Status:  Discontinued     3.375 g 100 mL/hr over 30 Minutes Intravenous 3 times per day 12/25/14 0852 12/25/14 0921   12/20/2014 2200  [MAR Hold]  rifaximin (XIFAXAN) tablet 550 mg  Status:  Discontinued     (MAR Hold since 12/31/2014 1721)   550 mg Oral 2 times daily 12/15/2014 1634 12/09/2014 1726   12/22/2014 1430  piperacillin-tazobactam (ZOSYN) IVPB 3.375 g     3.375 g 12.5 mL/hr over 240 Minutes Intravenous 3 times per day 12/13/2014 1403 12/24/14 0347        Assessment/Plan Ischemic bowel  Septic shock POD 11/9/4, s/p ex lap with open abdomen and SBR, s/p relook and closure, s/p ex lap and closure with retentions for dehiscence -continue eakin's pouch to measure peritoneal output -SCD/heparin as tolerated -mobilize -will allow clears for now -completed 8 days of zosyn, restarted on 2/20 Cirrhosis -followed at [facility] -lactulose Post op ileus/PCM -TNA -will ensure PO intake is adequate before  weaning TNA -clears today AKI  -3900--->3500l yesterday, hopefully will continue to decrease -monitor I/Os delirium  PAF  Erby Pian, ANP-BC [facility] Pager [phone](7A-4:30P) For consults and floor pages call [phone](7A-4:30P)  12/27/2014 9:46 AM

## 2014-12-27 NOTE — Progress Notes (Signed)
NUTRITION FOLLOW UP  Intervention:    TPN dosing per pharmacy  Resource Breeze PO TID, each supplement provides 250 kcal and 9 grams of protein  Nutrition Dx:   Inadequate oral intake related to inability to eat as evidenced by recent bowel surgery and clear liquid diet; ongoing  Goal:   Intake to meet >90% of estimated nutrition needs.  Monitor:   TPN tolerance, diet advancement, labs, weight trend.  Assessment:   64yo male with hx HTN, CAD s/p CABG, cirrhosis, reported recent hx pancreatitis. Presented with 2 day hx worsening abd pain, nausea and vomiting.  SP ex lap with small bowel resection and application of wound VAC on 2/11. Now with Eakin pouch to abdominal wound. Diet was advanced to clear liquids today.   Patient is receiving TPN with Clinimix 5/15 @ 110 ml/hr and lipids @ 10 ml/hr. Provides 2354 kcal, and 132 grams protein per day. Meets 100% minimum estimated energy needs and 100% minimum estimated protein needs.  Height: Ht Readings from Last 1 Encounters:  12/26/14 6\' 1"  (1.854 m)    Weight Status:   Wt Readings from Last 1 Encounters:  12/27/14 232 lb 9.4 oz (105.5 kg)    12/21/14 252 lb 10.4 oz (114.6 kg)       12/17/14 235 lb 14.3 oz (107 kg)       Re-estimated needs:  Kcal: 2200-2400 Protein: 120-135 grams Fluid: >2.2 L  Skin: abdominal incision, deep tissue injury on nose due to NGT  Diet Order: TPN (CLINIMIX) Adult without lytes TPN (CLINIMIX) Adult without lytes Diet clear liquid   Intake/Output Summary (Last 24 hours) at 12/27/14 1414 Last data filed at 12/27/14 0900  Gross per 24 hour  Intake  [id number] ml  Output   3950 ml  Net  30523 ml    Last BM: 2/18   Labs:   Recent Labs Lab 12/25/14 0500 12/26/14 1100 12/27/14 0437  NA 130* 134* 130*  K 4.8 4.5 4.1  CL 106 109 108  CO2 20 20 18*  BUN 68* 78* 78*  CREATININE 1.72* 1.59* 1.68*  CALCIUM 7.1* 7.5* 7.2*  MG 3.0* 2.9* 2.7*  PHOS 6.0* 5.0* 4.4  GLUCOSE 135*  127* 125*    CBG (last 3)   Recent Labs  12/27/14 0436 12/27/14 0726 12/27/14 1153  GLUCAP 130* 128* 181*    Scheduled Meds: . chlorhexidine  15 mL Mouth Rinse BID  . heparin subcutaneous  5,000 Units Subcutaneous 3 times per day  . insulin aspart  0-9 Units Subcutaneous TID WC  . lactulose  20 g Oral BID  . metoprolol  2.5 mg Intravenous Q6H  . piperacillin-tazobactam (ZOSYN)  IV  3.375 g Intravenous Q8H  . vancomycin  1,250 mg Intravenous Q24H    Continuous Infusions: . sodium chloride    . TPN (CLINIMIX) Adult without lytes 110 mL/hr at 12/27/14 0900   And  . fat emulsion 10 kcal (12/27/14 0900)  . TPN (CLINIMIX) Adult without lytes     And  . fat emulsion    . sodium chloride 0.9 % 1,000 mL infusion 125 mL/hr at 12/24/14 1300    Joaquin CourtsKimberly Roczen Waymire, RD, LDN, CNSC Pager [phone] After Hours Pager [phone]

## 2014-12-27 NOTE — Progress Notes (Signed)
PROGRESS NOTE  Kyle West GMW:[id number] DOB: 14-May-1951 DOA: 10-Jan-2015 PCP: Charmaine Downs, MD   Brief history 64yo male with hx HTN, CAD s/p CABG, cirrhosis of unclear etiology (followed at [facility]), reported recent hx pancreatitis. Presented to OSH 2/11 with 2 day hx worsening abd pain, nausea and vomiting. CT at outside hospital revealed pneumatosis, hepatic and portal venous gas and infrarenal AAA. He was tx to [facility] for further eval with concern for ischemic bowel. The patient was started on intravenous Unasyn and vancomycin for sepsis and peritonitis secondary to ischemic bowel. Gen. surgery was consulted. The patient underwent a laparotomy on 01/10/2015 with small bowel resection and application of wound VAC(Dr Cornett). Postoperatively, the patient was left intubated. He developed hypotension requiring central venous catheter and vasopressors. The patient was taken back to the OR on 12/11/2014 for a second look operation secondary to ischemia involving his ileum and small bowel resection. The small bowel remained viable with no signs of ischemia. He was weaned off of vasopressors and extubated on 12/20/2014. The patient's postoperative course was also complicated by atrial fibrillation with RVR. He was placed on amiodarone drip for short period of time which was d/ced on 2/16 . He has converted back to sinus rhythm. The patient was taken back to surgery on 01/01/2015 for dehiscence of his abdominal wall wound. After surgery on 2/18, he had another episode of Aflutter which has since converted back to sinus. Assessment/Plan: Ischemic bowel/peritonitis -appreciate surgery followup -POD #11/9--s/p ex lap with open abdomen and SBR, s/p relook and closure -POD#4--s/p ex lap and closure with retentions for dehiscence -npo except meds for now -TPN per surgery Wound Dehiscence -2/18--s/p repair Septic shock -Secondary to ischemic bowel/peritonitis -Remained stable off  vasopressors -Weaned off vasopressors 12/20/2014 -Continue IV Zosyn--last day was 12/08/2014  -12/24/14--WBC increase to 28.9--> bloodcultures 2 sets, UA/urine culture, -12/25/14--procalcitonin--9.58, lactic acid--1.7 -12/25/14--restarted abx AKI -Eakin output >4.8L in past 24 hours--noted in "flowsheets" -Secondary to sepsis and fluid shifts in setting of hypotension and large ascites outpt -had hypotension after surgery 2/18 -check FeNa -restart IVF  Delirium/Acute Encephalopathy -multifactorial including sepsis, opioids, renal failure -minimize opioids if possible  Acute respiratory failure -Patient was intubated February 11>>> February 15 -presently stable on RA-->100% saturations -CXR--bibasilar atelectasis; no consolidations    PAF with RVR -had another episode 2/18 after surgery -Back in sinus -CHADS-VASc = 1 -weaned off amio drip on 2/16 -Change patient back to oral metoprolol tartrate when able to tolerate po  Hyperglycemia -Hemoglobin A1c--5.5 -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 -Patient follows with hepatology at [facility] -Restart lactulose -Restart rifaximin when able to take po -restart Actigall when able to take po -Ammonia 35-->27 Diarrhea -Cdiff neg on 2/16 Hypertension -Transition IV to po metoprolol tartrate when able to take po Deconditioning  -PT recommends CIR evaluation   Procedures/Studies: Dg Chest Port 1 View  12/24/2014   CLINICAL DATA:  Dyspnea, confusion, fever, history cirrhosis, coronary artery disease, hypertension, pancreatitis, former smoker  EXAM: PORTABLE CHEST - 1 VIEW  COMPARISON:  Portable exam 1326 hours compared to 12/19/2014  FINDINGS: Interval removal of endotracheal and nasogastric tubes.  LEFT jugular central venous catheter tip projects over SVC.  Normal heart size post CABG.  Mediastinal contours and pulmonary vascularity normal.  Decreased bibasilar atelectasis.  Linear subsegmental atelectasis LEFT upper lobe.  Remaining  lungs clear.  No pleural effusion or pneumothorax.  IMPRESSION: Decreased bibasilar and slightly increased LEFT upper lobe subsegmental atelectasis.  Electronically Signed   By: Ulyses Southward M.D.   On: 12/24/2014 14:42   Dg Chest Portable 1 View  12/19/2014   CLINICAL DATA:  Respiratory failure.  EXAM: PORTABLE CHEST - 1 VIEW  COMPARISON:  12/27/2014  FINDINGS: Endotracheal tube is in place with tip just above the level of the carina. Nasogastric tube is in place with tip overlying the level of the stomach. Patient has had median sternotomy and CABG. Left IJ central line tip overlies the superior vena cava.  The heart is enlarged. There is dense opacity of the left lung base consistent with atelectasis or infiltrate. Small bilateral pleural effusions are identified. There has been some improvement in aeration of the right lung base.  IMPRESSION: 1. Postoperative lines and tubes. 2. Slightly improved aeration of the right lung base. 3. Persistent left lung base atelectasis and effusion.   Electronically Signed   By: Norva Pavlov M.D.   On: 12/19/2014 08:04   Dg Chest Port 1 View  01/01/2015   CLINICAL DATA:  Hypoxia/respiratory failure  EXAM: PORTABLE CHEST - 1 VIEW  COMPARISON:  December 17, 2014  FINDINGS: Endotracheal tube tip is 2.2 cm above the carina. Nasogastric tube tip and side port are in the stomach. Central catheter tip is in the superior vena cava. No pneumothorax. There is left lower lobe consolidation with small left effusion. There is a small right effusion with right base atelectasis. Heart is mildly enlarged. Patient is status post coronary artery bypass grafting. No adenopathy.  IMPRESSION: Tube and catheter positions as described without pneumothorax. Areas of consolidation with effusions. Heart prominent but stable.   Electronically Signed   By: Bretta Bang III M.D.   On: 12/09/2014 14:48   Dg Chest Port 1 View  12/17/2014   CLINICAL DATA:  Respiratory failure.  EXAM: PORTABLE  CHEST - 1 VIEW  COMPARISON:  December 16, 2014.  FINDINGS: Stable cardiomediastinal silhouette. Endotracheal and nasogastric tubes are unchanged in position. Left internal jugular catheter is also noted with distal tip overlying expected position of the SVC. Status post coronary artery bypass graft. Bibasilar opacities are noted most consistent with subsegmental atelectasis. No pneumothorax is noted. No significant pleural effusion is noted.  IMPRESSION: Stable support apparatus. Stable bibasilar subsegmental atelectasis.   Electronically Signed   By: Lupita Raider, M.D.   On: 12/17/2014 07:53   Dg Chest Port 1 View  12/24/2014   CLINICAL DATA:  Status post central line placement  EXAM: PORTABLE CHEST - 1 VIEW  COMPARISON:  Chest film from earlier in the same day  FINDINGS: Postsurgical changes are again seen. An endotracheal tube is now noted 5.2 cm above the carina. A nasogastric catheter is seen extending into the stomach. A left jugular central line is noted with the tip in the mid superior vena cava. No pneumothorax is seen. Bibasilar atelectatic changes are again noted.  IMPRESSION: Tubes and lines as described above. No pneumothorax is noted. Stable bibasilar atelectatic changes are seen.   Electronically Signed   By: Alcide Clever M.D.   On: 12/15/2014 19:11   Dg Chest Port 1 View  12/15/2014   CLINICAL DATA:  Abdominal aortic aneurysm. Shortness of breath. Coronary artery disease. Pre-op respiratory exam  EXAM: PORTABLE CHEST - 1 VIEW  COMPARISON:  None.  FINDINGS: Low lung volumes are seen. Streaky opacity lung bases is likely due to atelectasis, although pneumonia cannot definitely be excluded. No evidence of pleural effusion. Heart size is within normal limits allowing  for low lung volumes. Previous CABG noted.  IMPRESSION: Low lung volumes with probable bibasilar atelectasis, although pneumonia cannot definitely be excluded.   Electronically Signed   By: Myles Rosenthal M.D.   On: 2015-01-02 13:16           Subjective: Patient is mostly confused but more alert than last few days. Denies any chest pain, shortness breath, vomiting. Complains of abdominal pain. Controlled with current opioids.  Objective: Filed Vitals:   12/27/14 0724 12/27/14 0725 12/27/14 0726 12/27/14 0727  BP: 139/52     Pulse: 90 85 90 91  Temp:      TempSrc:      Resp: 14 18 17 19   Height:      Weight:      SpO2: 99% 100% 100% 100%    Intake/Output Summary (Last 24 hours) at 12/27/14 1134 Last data filed at 12/27/14 0900  Gross per 24 hour  Intake  09811 ml  Output   3950 ml  Net  30883 ml   Weight change: -2.4 kg (-5 lb 4.7 oz) Exam:   General:  Pt is alert, follows commands appropriately, not in acute distress  HEENT: No icterus, No thrush,  [place]/AT  Cardiovascular: RRR, S1/S2, no rubs, no gallops  Respiratory: CTA bilaterally, no wheezing, no crackles, no rhonchi  Abdomen: Soft/+BS, diffusely tender. No peritoneal sign., non distended, no guarding  Extremities: No edema, No lymphangitis, No petechiae, No rashes, no synovitis  Data Reviewed: Basic Metabolic Panel:  Recent Labs Lab 12/15/2014 0340 12/24/14 0335 12/25/14 0500 12/26/14 1100 12/27/14 0437  NA 132* 129* 130* 134* 130*  K 3.4* 4.6 4.8 4.5 4.1  CL 102 101 106 109 108  CO2 27 22 20 20  18*  GLUCOSE 125* 171* 135* 127* 125*  BUN 38* 51* 68* 78* 78*  CREATININE 1.04 1.52* 1.72* 1.59* 1.68*  CALCIUM 7.8* 7.5* 7.1* 7.5* 7.2*  MG 2.9* 2.7* 3.0* 2.9* 2.7*  PHOS 4.4 5.1* 6.0* 5.0* 4.4   Liver Function Tests:  Recent Labs Lab 12/25/2014 0340 12/25/14 0500 12/27/14 0437  AST 41* 62* 71*  ALT 16 23 30   ALKPHOS 89 90 112  BILITOT 3.2* 3.1* 2.8*  PROT 4.5* 4.2* 4.7*  ALBUMIN 2.1* 2.3* 1.9*   No results for input(s): LIPASE, AMYLASE in the last 168 hours.  Recent Labs Lab 12/21/14 1919 12/24/14 1411  AMMONIA 35* 27   CBC:  Recent Labs Lab 12/24/14 1411 12/25/14 0500 12/25/14 1700 12/26/14 1240  12/27/14 0437  WBC 28.9* 26.8* 23.8* 20.1* 20.5*  NEUTROABS  --   --   --   --  16.2*  HGB 10.6* 9.0* 8.8* 8.5* 8.7*  HCT 29.3* 25.6* 25.1* 24.0* 24.9*  MCV 95.8 95.9 98.0 96.4 96.1  PLT 144* 136* 126* 131* 145*   Cardiac Enzymes: No results for input(s): CKTOTAL, CKMB, CKMBINDEX, TROPONINI in the last 168 hours. BNP: Invalid input(s): POCBNP CBG:  Recent Labs Lab 12/26/14 1233 12/26/14 1513 12/26/14 1913 12/26/14 2306 12/27/14 0436  GLUCAP 164* 128* 137* 151* 130*    Recent Results (from the past 240 hour(s))  Clostridium Difficile by PCR     Status: None   Collection Time: 12/21/14  3:32 AM  Result Value Ref Range Status   C difficile by pcr NEGATIVE NEGATIVE Final  Culture, blood (routine x 2)     Status: None (Preliminary result)   Collection Time: 12/25/14 11:00 AM  Result Value Ref Range Status   Specimen Description BLOOD LEFT HAND  Final   Special Requests BOTTLES DRAWN AEROBIC ONLY 7CC  Final   Culture   Final           BLOOD CULTURE RECEIVED NO GROWTH TO DATE CULTURE WILL BE HELD FOR 5 DAYS BEFORE ISSUING A FINAL NEGATIVE REPORT Performed at [facility]    Report Status PENDING  Incomplete  Urine culture     Status: None   Collection Time: 12/25/14 11:00 AM  Result Value Ref Range Status   Specimen Description URINE, RANDOM  Final   Special Requests NONE  Final   Colony Count   Final    1,000 COLONIES/ML Performed at [facility]    Culture   Final    INSIGNIFICANT GROWTH Performed at [facility]    Report Status 12/26/2014 FINAL  Final  Culture, blood (routine x 2)     Status: None (Preliminary result)   Collection Time: 12/25/14 11:10 AM  Result Value Ref Range Status   Specimen Description BLOOD LEFT HAND  Final   Special Requests BOTTLES DRAWN AEROBIC ONLY 5CC  Final   Culture   Final           BLOOD CULTURE RECEIVED NO GROWTH TO DATE CULTURE WILL BE HELD FOR 5 DAYS BEFORE ISSUING A FINAL NEGATIVE REPORT Performed at  [facility]    Report Status PENDING  Incomplete     Scheduled Meds: . chlorhexidine  15 mL Mouth Rinse BID  . heparin subcutaneous  5,000 Units Subcutaneous 3 times per day  . insulin aspart  0-9 Units Subcutaneous TID WC  . lactulose  20 g Oral BID  . metoprolol  2.5 mg Intravenous Q6H  . piperacillin-tazobactam (ZOSYN)  IV  3.375 g Intravenous Q8H  . vancomycin  1,250 mg Intravenous Q24H   Continuous Infusions: . TPN (CLINIMIX) Adult without lytes 110 mL/hr at 12/27/14 0900   And  . fat emulsion 10 kcal (12/27/14 0900)  . TPN (CLINIMIX) Adult without lytes     And  . fat emulsion    . sodium chloride 0.9 % 1,000 mL infusion 125 mL/hr at 12/24/14 1300     Caliana Spires, DO  [facility] Pager [phone]  If 7PM-7AM, please contact night-coverage [email or web address] Password [email or web address] 12/27/2014, 11:34 AM   LOS: 11 days

## 2014-12-27 NOTE — Progress Notes (Signed)
PARENTERAL NUTRITION CONSULT NOTE - FOLLOW UP  Pharmacy Consult for TPN Indication: Prolonged ileus  Allergies  Allergen Reactions  . Oxycodone Hives and Other (See Comments)    Hallucinations    Patient Measurements: Height: 6\' 1"  (185.4 cm) Weight: 232 lb 9.4 oz (105.5 kg) IBW/kg (Calculated) : 79.9  Vital Signs: Temp: 98.1 F (36.7 C) (02/22 0723) Temp Source: Axillary (02/22 0723) BP: 137/65 mmHg (02/22 0505) Pulse Rate: 87 (02/22 0505) Intake/Output from previous day: 02/21 0701 - 02/22 0700 In: [id number] [P.O.:33333; I.V.:40; IV Piggyback:350; TPN:1320] Out: 4050 [Urine:550; Stool:3500] Intake/Output from this shift: Total I/O In: -  Out: 300 [Urine:300]  Labs:  Recent Labs  12/25/14 1700 12/26/14 1240 12/27/14 0437  WBC 23.8* 20.1* 20.5*  HGB 8.8* 8.5* 8.7*  HCT 25.1* 24.0* 24.9*  PLT 126* 131* 145*     Recent Labs  12/25/14 0500 12/26/14 1100 12/27/14 0437  NA 130* 134* 130*  K 4.8 4.5 4.1  CL 106 109 108  CO2 20 20 18*  GLUCOSE 135* 127* 125*  BUN 68* 78* 78*  CREATININE 1.72* 1.59* 1.68*  CALCIUM 7.1* 7.5* 7.2*  MG 3.0* 2.9* 2.7*  PHOS 6.0* 5.0* 4.4  PROT 4.2*  --  4.7*  ALBUMIN 2.3*  --  1.9*  AST 62*  --  71*  ALT 23  --  30  ALKPHOS 90  --  112  BILITOT 3.1*  --  2.8*  TRIG  --   --  56   Estimated Creatinine Clearance: 56.6 mL/min (by C-G formula based on Cr of 1.68).    Recent Labs  12/26/14 1913 12/26/14 2306 12/27/14 0436  GLUCAP 137* 151* 130*   Insulin Requirements in the past 24 hours:  5 units sensitive SSI, 15 units insulin in TPN  Nutritional Goals: (per RD note on 2/12) 1515-1840 kcal, >/= 167g protein while on vent  2200-2400 kcal, 120-135g of protein per day when off vent  Current Nutrition:  -Clear liquids-  -Clinimix 5/15 at 14210ml/hr and Lipids at 4210ml/hr, providing 132g protein and 2354 kCal --meets 100% of goals  Admit:  [name] transferred from outside hospital for concern of ischemic bowel. He  underwent surgery on 2/11 - exploratory laparotomy, small bowel resection and application of a wound vac. TPN was started in anticipation of prolonged bowel rest/ileus.   GI:  S/P ex. Lap with SBR on 2/11. Also history of pancreatitis and cirrhosis child pugh B. AOZ:[id number]:Lactulose, PPI, Rifaximin, spironolactione, Ursodiol. S/P side-to-side small bowel anastomosis and closure of abdomen on 2/13. Abd soft. NG d/c'd- no vomiting charted. POD #4 s/p ex lap with closure. Suspected ascites from wound- wound in tact. Baseline prealbumin low at 6.4, repeat 5.1, repeat after being on TPN is in process. Had BM and trialing on clears per CCS.   Endo: No history of diabetes. CBGs 125-151- has 15 units regular insulin in TPN bag along with SSI ordered  Lytes: K 4.1, Na 130, Mg 2.7, Phos 4.4, CorCa 8.6 - lytes remain stable on electrolyte-free TPN  Renal: AKI s/p surgery. Scr 1.68, UOP only 0.2 ml/kg/hr in last 24h- hydrating with NS @ 125 mL/hr   Pulm:100/2L [place]   Cards: History of CAD. BP soft-nml, HR wnl. Metoprolol IV scheduled  Hepatobil: history of cirrhosis/NASH.alkp phos nml, AST up tp 71, ALT remains wnl, Tbili down at 2.8, INR 1.67 (2/13). TG 56.  Ammonia 27 (2/19; improved on lactulose)  Neuro: Now with post anesthesia hepatic encephalopathy, On lactulose BID, PRN Haldol  ID: [email or web address] for intra-abdominal coverage (2/11>>), added Vanc d/t sepsis (2/20>). Afebrile, WBC up to 20.5, CDiff(-) 2/16, PCT 10.29, LA 1.7  Best Practices: SCDs, mouthcare, Hep SQ, PPI   TPN Access: Triple Lumen IJ (placed 2/11) TPN day#:10; (12/17/14 >> )  Plan:  -Continue Clinimix NO LYTES 5/15 at 125mL/hr with 20% IVFE at 28mL/hr. This is providing 100% of patient's goals off the vent. -Daily MVI and TE in TPN -Continue 15 units insulin in TPN bag  -Continue SSI -MIVF as per surgery -F/U diet advancement, wean TPN as appropriate -BMET, mag and phos in AM  Toyia Jelinek D. Pier Bosher, PharmD, BCPS Clinical  Pharmacist Pager: [phone] 12/27/2014 7:55 AM

## 2014-12-27 NOTE — Progress Notes (Signed)
Occupational Therapy Treatment Patient Details Name: Romana Juniperathaniel Blayney MRN: [id number] DOB: 1951/04/01 Today's Date: 12/27/2014    History of present illness pt presents with Ischemic Bowel and has had an ex lap with re-exploration, followed by dehisence and back to OR for closure, but with drain.  Marland Kitchen.  pt with hx of Cirrhosis.     OT comments  Pt seen after return to bed after being up in chair.  Pt with lethargy and difficulty keeping eyes open.  Needing verbal cues to remain alert for bed level grooming activities. Cooperative with all activities.  Follow Up Recommendations  CIR;Supervision/Assistance - 24 hour    Equipment Recommendations       Recommendations for Other Services      Precautions / Restrictions Precautions Precautions: Fall Precaution Comments: Abdominal incisions. Restrictions Weight Bearing Restrictions: No       Mobility Bed Mobility     General bed mobility comments: +2 max assist to reposition pt up in bed  Transfers     Balance                      ADL Overall ADL's : Needs assistance/impaired     Grooming: Bed level;Oral care;Wash/dry hands;Wash/dry face;Brushing hair           Upper Body Dressing : Moderate assistance;Bed level                     General ADL Comments: Pt agreeable to grooming at bedlevel, but required verbal cues to remain alert.  Repeatedly closing eyes.      Vision                     Perception     Praxis      Cognition   Behavior During Therapy: Flat affect Overall Cognitive Status: Impaired/Different from baseline Area of Impairment: Orientation;Attention;Memory;Following commands;Safety/judgement;Awareness;Problem solving Orientation Level: Disoriented to;Place;Time;Situation Current Attention Level: Focused Memory: Decreased short-term memory;Decreased recall of precautions  Following Commands: Follows one step commands with increased time;Follows multi-step commands  inconsistently Safety/Judgement: Decreased awareness of safety;Decreased awareness of deficits Awareness: Intellectual Problem Solving: Decreased initiation;Slow processing;Difficulty sequencing;Requires verbal cues;Requires tactile cues General Comments: pt had just returned to bed after up in chair, fatigue may be contributing factor as well as pt [facility]    Extremity/Trunk Assessment               Exercises     Shoulder Instructions       General Comments      Pertinent Vitals/ Pain       Pain Assessment: Faces Faces Pain Scale: Hurts a little bit Pain Location: abdomen Pain Descriptors / Indicators: Grimacing Pain Intervention(s): Monitored during session;Repositioned  Home Living                                          Prior Functioning/Environment              Frequency Min 2X/week     Progress Toward Goals  OT Goals(current goals can now be found in the care plan section)  Progress towards OT goals: Not progressing toward goals - comment (lethargy)  Acute Rehab OT Goals Patient Stated Goal: to not hurt  Plan Discharge plan remains appropriate    Co-evaluation  End of Session     Activity Tolerance Patient limited by lethargy;Patient limited by fatigue   Patient Left in bed;with restraints reapplied;with bed alarm set   Nurse Communication          Time: 1220-1230 OT Time Calculation (min): 10 min  Charges: OT General Charges $OT Visit: 1 Procedure OT Treatments $Self Care/Home Management : 8-22 mins  Evern Bio 12/27/2014, 12:42 PM  [phone]

## 2014-12-28 LAB — PHOSPHORUS: PHOSPHORUS: 3.9 mg/dL (ref 2.3–4.6)

## 2014-12-28 LAB — GLUCOSE, CAPILLARY
GLUCOSE-CAPILLARY: 135 mg/dL — AB (ref 70–99)
GLUCOSE-CAPILLARY: 97 mg/dL (ref 70–99)
Glucose-Capillary: 125 mg/dL — ABNORMAL HIGH (ref 70–99)
Glucose-Capillary: 124 mg/dL — ABNORMAL HIGH (ref 70–99)
Glucose-Capillary: 157 mg/dL — ABNORMAL HIGH (ref 70–99)

## 2014-12-28 LAB — MAGNESIUM: Magnesium: 2.5 mg/dL (ref 1.5–2.5)

## 2014-12-28 LAB — BASIC METABOLIC PANEL
Anion gap: 5 (ref 5–15)
BUN: 73 mg/dL — AB (ref 6–23)
CO2: 17 mmol/L — ABNORMAL LOW (ref 19–32)
CREATININE: 1.51 mg/dL — AB (ref 0.50–1.35)
Calcium: 7 mg/dL — ABNORMAL LOW (ref 8.4–10.5)
Chloride: 106 mmol/L (ref 96–112)
GFR, EST AFRICAN AMERICAN: 55 mL/min — AB (ref >90)
GFR, EST NON AFRICAN AMERICAN: 47 mL/min — AB (ref >90)
GLUCOSE: 145 mg/dL — AB (ref 70–99)
POTASSIUM: 4.2 mmol/L (ref 3.5–5.1)
Sodium: 128 mmol/L — ABNORMAL LOW (ref 135–145)

## 2014-12-28 LAB — LACTIC ACID, PLASMA: LACTIC ACID, VENOUS: 1 mmol/L (ref 0.5–2.0)

## 2014-12-28 LAB — CBC
HEMATOCRIT: 24.2 % — AB (ref 39.0–52.0)
Hemoglobin: 8.5 g/dL — ABNORMAL LOW (ref 13.0–17.0)
MCH: 33.5 pg (ref 26.0–34.0)
MCHC: 35.1 g/dL (ref 30.0–36.0)
MCV: 95.3 fL (ref 78.0–100.0)
Platelets: 145 10*3/uL — ABNORMAL LOW (ref 150–400)
RBC: 2.54 MIL/uL — ABNORMAL LOW (ref 4.22–5.81)
RDW: 16.7 % — AB (ref 11.5–15.5)
WBC: 17.6 10*3/uL — ABNORMAL HIGH (ref 4.0–10.5)

## 2014-12-28 MED ORDER — TRACE MINERALS CR-CU-F-FE-I-MN-MO-SE-ZN IV SOLN
INTRAVENOUS | Status: AC
  Administered 2014-12-28: 18:00:00 via INTRAVENOUS
  Filled 2014-12-28: qty 2640

## 2014-12-28 MED ORDER — FAT EMULSION 20 % IV EMUL
240.0000 mL | INTRAVENOUS | Status: AC
  Administered 2014-12-28: 240 mL via INTRAVENOUS
  Filled 2014-12-28: qty 250

## 2014-12-28 MED ORDER — ENSURE COMPLETE PO LIQD: 237.0000 mL | Freq: Two times a day (BID) | ORAL | Status: DC

## 2014-12-28 NOTE — Progress Notes (Signed)
Patient ID: Kyle West, male   DOB: [birth year], 64 y.o.   MRN: [id number]     [facility]      [address] [place], [place] [address]    Phone: [phone] FAX: [phone]     Subjective: Having BMs.  A lot more alert today.  Wants to eat.  Still moderate amount of ascites, but output is decreasing.  Afebrile.  WBC down.     Objective:  Vital signs:  Filed Vitals:   12/27/14 2304 12/28/14 0311 12/28/14 0700 12/28/14 0724  BP: 115/50 149/51  135/48  Pulse: 90 89 88 90  Temp: 97.9 F (36.6 C) 98.1 F (36.7 C) 98.1 F (36.7 C)   TempSrc: Oral Oral Oral   Resp: 17 21 23 19   Height:      Weight:  231 lb 14.8 oz (105.2 kg)    SpO2: 100% 100% 100% 100%    Last BM Date: 12/27/14  Intake/Output   Yesterday:  02/22 0701 - 02/23 0700 In: 3151 [P.O.:300; I.V.:1200; IV Piggyback:100; TPN:2640] Out: 7616 [WVPXT:0626; RSWNI:6270] This shift:    I/O last 3 completed shifts: In: 3500 [P.O.:300; I.V.:1200; IV Piggyback:100] Out: 6525 [Urine:1825; Stool:4700]    Physical Exam: General: Pt awake/alert oriented to self.  Abdomen: Soft. Nondistended. Midline wound with retention sutures in place, pink, large amount of drainage into eakin's pouch which is serosanguinous. No evidence of peritonitis. No incarcerated hernias.    Problem List:   Active Problems:   Abdominal pain   Ischemic bowel disease   Acute respiratory failure   Sepsis   AKI (acute kidney injury)   Peritonitis   Acute respiratory failure, unspecified whether with hypoxia or hypercapnia    Results:   Labs: Results for orders placed or performed during the hospital encounter of 12/30/2014 (from the past 48 hour(s))  Basic metabolic panel     Status: Abnormal   Collection Time: 12/26/14 11:00 AM  Result Value Ref Range   Sodium 134 (L) 135 - 145 mmol/L   Potassium 4.5 3.5 - 5.1 mmol/L   Chloride 109 96 - 112 mmol/L   CO2 20 19 - 32 mmol/L    Glucose, Bld 127 (H) 70 - 99 mg/dL   BUN 78 (H) 6 - 23 mg/dL   Creatinine, Ser 1.59 (H) 0.50 - 1.35 mg/dL   Calcium 7.5 (L) 8.4 - 10.5 mg/dL   GFR calc non Af Amer 44 (L) >90 mL/min   GFR calc Af Amer 51 (L) >90 mL/min    Comment: (NOTE) The eGFR has been calculated using the CKD EPI equation. This calculation has not been validated in all clinical situations. eGFR's persistently <90 mL/min signify possible Chronic Kidney Disease.    Anion gap 5 5 - 15  Magnesium     Status: Abnormal   Collection Time: 12/26/14 11:00 AM  Result Value Ref Range   Magnesium 2.9 (H) 1.5 - 2.5 mg/dL  Phosphorus     Status: Abnormal   Collection Time: 12/26/14 11:00 AM  Result Value Ref Range   Phosphorus 5.0 (H) 2.3 - 4.6 mg/dL  Glucose, capillary     Status: Abnormal   Collection Time: 12/26/14 12:33 PM  Result Value Ref Range   Glucose-Capillary 164 (H) 70 - 99 mg/dL   Comment 1 Notify RN    Comment 2 Documented in Char   CBC     Status: Abnormal   Collection Time: 12/26/14 12:40 PM  Result Value Ref  Range   WBC 20.1 (H) 4.0 - 10.5 K/uL   RBC 2.49 (L) 4.22 - 5.81 MIL/uL   Hemoglobin 8.5 (L) 13.0 - 17.0 g/dL   HCT 24.0 (L) 39.0 - 52.0 %   MCV 96.4 78.0 - 100.0 fL   MCH 34.1 (H) 26.0 - 34.0 pg   MCHC 35.4 30.0 - 36.0 g/dL   RDW 16.6 (H) 11.5 - 15.5 %   Platelets 131 (L) 150 - 400 K/uL  Glucose, capillary     Status: Abnormal   Collection Time: 12/26/14  3:13 PM  Result Value Ref Range   Glucose-Capillary 128 (H) 70 - 99 mg/dL   Comment 1 Notify RN    Comment 2 Documented in Char   Glucose, capillary     Status: Abnormal   Collection Time: 12/26/14  7:13 PM  Result Value Ref Range   Glucose-Capillary 137 (H) 70 - 99 mg/dL  Glucose, capillary     Status: Abnormal   Collection Time: 12/26/14 11:06 PM  Result Value Ref Range   Glucose-Capillary 151 (H) 70 - 99 mg/dL  Glucose, capillary     Status: Abnormal   Collection Time: 12/27/14  4:36 AM  Result Value Ref Range   Glucose-Capillary  130 (H) 70 - 99 mg/dL  Comprehensive metabolic panel     Status: Abnormal   Collection Time: 12/27/14  4:37 AM  Result Value Ref Range   Sodium 130 (L) 135 - 145 mmol/L   Potassium 4.1 3.5 - 5.1 mmol/L   Chloride 108 96 - 112 mmol/L   CO2 18 (L) 19 - 32 mmol/L   Glucose, Bld 125 (H) 70 - 99 mg/dL   BUN 78 (H) 6 - 23 mg/dL   Creatinine, Ser 1.68 (H) 0.50 - 1.35 mg/dL   Calcium 7.2 (L) 8.4 - 10.5 mg/dL   Total Protein 4.7 (L) 6.0 - 8.3 g/dL   Albumin 1.9 (L) 3.5 - 5.2 g/dL   AST 71 (H) 0 - 37 U/L   ALT 30 0 - 53 U/L   Alkaline Phosphatase 112 39 - 117 U/L   Total Bilirubin 2.8 (H) 0.3 - 1.2 mg/dL   GFR calc non Af Amer 41 (L) >90 mL/min   GFR calc Af Amer 48 (L) >90 mL/min    Comment: (NOTE) The eGFR has been calculated using the CKD EPI equation. This calculation has not been validated in all clinical situations. eGFR's persistently <90 mL/min signify possible Chronic Kidney Disease.    Anion gap 4 (L) 5 - 15  Magnesium     Status: Abnormal   Collection Time: 12/27/14  4:37 AM  Result Value Ref Range   Magnesium 2.7 (H) 1.5 - 2.5 mg/dL  Phosphorus     Status: None   Collection Time: 12/27/14  4:37 AM  Result Value Ref Range   Phosphorus 4.4 2.3 - 4.6 mg/dL  CBC     Status: Abnormal   Collection Time: 12/27/14  4:37 AM  Result Value Ref Range   WBC 20.5 (H) 4.0 - 10.5 K/uL   RBC 2.59 (L) 4.22 - 5.81 MIL/uL   Hemoglobin 8.7 (L) 13.0 - 17.0 g/dL   HCT 24.9 (L) 39.0 - 52.0 %   MCV 96.1 78.0 - 100.0 fL   MCH 33.6 26.0 - 34.0 pg   MCHC 34.9 30.0 - 36.0 g/dL   RDW 16.7 (H) 11.5 - 15.5 %   Platelets 145 (L) 150 - 400 K/uL  Differential     Status: Abnormal  Collection Time: 12/27/14  4:37 AM  Result Value Ref Range   Neutrophils Relative % 80 (H) 43 - 77 %   Neutro Abs 16.2 (H) 1.7 - 7.7 K/uL   Lymphocytes Relative 5 (L) 12 - 46 %   Lymphs Abs 1.1 0.7 - 4.0 K/uL   Monocytes Relative 11 3 - 12 %   Monocytes Absolute 2.3 (H) 0.1 - 1.0 K/uL   Eosinophils Relative 4 0 - 5  %   Eosinophils Absolute 0.8 (H) 0.0 - 0.7 K/uL   Basophils Relative 0 0 - 1 %   Basophils Absolute 0.1 0.0 - 0.1 K/uL  Triglycerides     Status: None   Collection Time: 12/27/14  4:37 AM  Result Value Ref Range   Triglycerides 56 <150 mg/dL  Prealbumin     Status: Abnormal   Collection Time: 12/27/14  4:37 AM  Result Value Ref Range   Prealbumin 3.5 (L) 17.0 - 34.0 mg/dL    Comment: Performed at [facility]  Procalcitonin     Status: None   Collection Time: 12/27/14  4:37 AM  Result Value Ref Range   Procalcitonin 10.29 ng/mL    Comment:        Interpretation: PCT >= 10 ng/mL: Important systemic inflammatory response, almost exclusively due to severe bacterial sepsis or septic shock. (NOTE)         ICU PCT Algorithm               Non ICU PCT Algorithm    ----------------------------     ------------------------------         PCT < 0.25 ng/mL                 PCT < 0.1 ng/mL     Stopping of antibiotics            Stopping of antibiotics       strongly encouraged.               strongly encouraged.    ----------------------------     ------------------------------       PCT level decrease by               PCT < 0.25 ng/mL       >= 80% from peak PCT       OR PCT 0.25 - 0.5 ng/mL          Stopping of antibiotics                                             encouraged.     Stopping of antibiotics           encouraged.    ----------------------------     ------------------------------       PCT level decrease by              PCT >= 0.25 ng/mL       < 80% from peak PCT        AND PCT >= 0.5 ng/mL             Continuing antibiotics                                              encouraged.  Continuing antibiotics            encouraged.    ----------------------------     ------------------------------     PCT level increase compared          PCT > 0.5 ng/mL         with peak PCT AND          PCT >= 0.5 ng/mL             Escalation of antibiotics                                           strongly encouraged.      Escalation of antibiotics        strongly encouraged.   Glucose, capillary     Status: Abnormal   Collection Time: 12/27/14  7:26 AM  Result Value Ref Range   Glucose-Capillary 128 (H) 70 - 99 mg/dL   Comment 1 Notify RN    Comment 2 Documented in Char   Glucose, capillary     Status: Abnormal   Collection Time: 12/27/14 11:53 AM  Result Value Ref Range   Glucose-Capillary 181 (H) 70 - 99 mg/dL   Comment 1 Notify RN    Comment 2 Documented in Char   Sodium, urine, random     Status: None   Collection Time: 12/27/14  3:00 PM  Result Value Ref Range   Sodium, Ur <10 mmol/L    Comment: REPEATED TO VERIFY  Creatinine, urine, random     Status: None   Collection Time: 12/27/14  3:00 PM  Result Value Ref Range   Creatinine, Urine 67.13 mg/dL  Glucose, capillary     Status: Abnormal   Collection Time: 12/27/14  3:59 PM  Result Value Ref Range   Glucose-Capillary 136 (H) 70 - 99 mg/dL   Comment 1 Notify RN    Comment 2 Documented in Char   Glucose, capillary     Status: Abnormal   Collection Time: 12/27/14  7:27 PM  Result Value Ref Range   Glucose-Capillary 107 (H) 70 - 99 mg/dL  Glucose, capillary     Status: None   Collection Time: 12/27/14 11:05 PM  Result Value Ref Range   Glucose-Capillary 93 70 - 99 mg/dL  Glucose, capillary     Status: Abnormal   Collection Time: 12/28/14  3:55 AM  Result Value Ref Range   Glucose-Capillary 135 (H) 70 - 99 mg/dL  Basic metabolic panel     Status: Abnormal   Collection Time: 12/28/14  4:25 AM  Result Value Ref Range   Sodium 128 (L) 135 - 145 mmol/L   Potassium 4.2 3.5 - 5.1 mmol/L   Chloride 106 96 - 112 mmol/L   CO2 17 (L) 19 - 32 mmol/L   Glucose, Bld 145 (H) 70 - 99 mg/dL   BUN 73 (H) 6 - 23 mg/dL   Creatinine, Ser 1.51 (H) 0.50 - 1.35 mg/dL   Calcium 7.0 (L) 8.4 - 10.5 mg/dL   GFR calc non Af Amer 47 (L) >90 mL/min   GFR calc Af Amer 55 (L) >90 mL/min    Comment: (NOTE) The  eGFR has been calculated using the CKD EPI equation. This calculation has not been validated in all clinical situations. eGFR's persistently <90 mL/min signify possible Chronic Kidney Disease.    Anion gap 5 5 - 15  Magnesium     Status: None   Collection Time: 12/28/14  4:25 AM  Result Value Ref Range   Magnesium 2.5 1.5 - 2.5 mg/dL  Phosphorus     Status: None   Collection Time: 12/28/14  4:25 AM  Result Value Ref Range   Phosphorus 3.9 2.3 - 4.6 mg/dL  CBC     Status: Abnormal   Collection Time: 12/28/14  4:25 AM  Result Value Ref Range   WBC 17.6 (H) 4.0 - 10.5 K/uL   RBC 2.54 (L) 4.22 - 5.81 MIL/uL   Hemoglobin 8.5 (L) 13.0 - 17.0 g/dL   HCT 24.2 (L) 39.0 - 52.0 %   MCV 95.3 78.0 - 100.0 fL   MCH 33.5 26.0 - 34.0 pg   MCHC 35.1 30.0 - 36.0 g/dL   RDW 16.7 (H) 11.5 - 15.5 %   Platelets 145 (L) 150 - 400 K/uL  Lactic acid, plasma     Status: None   Collection Time: 12/28/14  4:25 AM  Result Value Ref Range   Lactic Acid, Venous 1.0 0.5 - 2.0 mmol/L  Glucose, capillary     Status: None   Collection Time: 12/28/14  7:23 AM  Result Value Ref Range   Glucose-Capillary 97 70 - 99 mg/dL    Imaging / Studies: No results found.  Medications / Allergies:  Scheduled Meds: . chlorhexidine  15 mL Mouth Rinse BID  . feeding supplement (RESOURCE BREEZE)  1 Container Oral TID BM  . heparin subcutaneous  5,000 Units Subcutaneous 3 times per day  . insulin aspart  0-9 Units Subcutaneous TID WC  . lactulose  20 g Oral BID  . metoprolol  2.5 mg Intravenous Q6H  . piperacillin-tazobactam (ZOSYN)  IV  3.375 g Intravenous Q8H   Continuous Infusions: . sodium chloride 100 mL/hr at 12/28/14 0547  . TPN (CLINIMIX) Adult without lytes 110 mL/hr at 12/27/14 1710   And  . fat emulsion 240 mL (12/27/14 1709)  . sodium chloride 0.9 % 1,000 mL infusion 125 mL/hr at 12/24/14 1300   PRN Meds:.fentaNYL, haloperidol lactate, levalbuterol, ondansetron (ZOFRAN) IV, white  petrolatum  Antibiotics: Anti-infectives    Start     Dose/Rate Route Frequency Ordered Stop   12/26/14 0930  vancomycin (VANCOCIN) 1,250 mg in sodium chloride 0.9 % 250 mL IVPB  Status:  Discontinued     1,250 mg 166.7 mL/hr over 90 Minutes Intravenous Every 24 hours 12/25/14 0912 12/28/14 0850   12/25/14 1000  piperacillin-tazobactam (ZOSYN) IVPB 3.375 g     3.375 g 12.5 mL/hr over 240 Minutes Intravenous Every 8 hours 12/25/14 0921     12/25/14 0930  vancomycin (VANCOCIN) 1,750 mg in sodium chloride 0.9 % 500 mL IVPB     1,750 mg 250 mL/hr over 120 Minutes Intravenous  Once 12/25/14 0912 12/25/14 1208   12/25/14 0900  piperacillin-tazobactam (ZOSYN) IVPB 3.375 g  Status:  Discontinued     3.375 g 100 mL/hr over 30 Minutes Intravenous 3 times per day 12/25/14 0852 12/25/14 0921   12/29/2014 2200  [MAR Hold]  rifaximin (XIFAXAN) tablet 550 mg  Status:  Discontinued     (MAR Hold since 12/25/2014 1721)   550 mg Oral 2 times daily 01/02/2015 1634 12/30/2014 1726   12/19/2014 1430  piperacillin-tazobactam (ZOSYN) IVPB 3.375 g     3.375 g 12.5 mL/hr over 240 Minutes Intravenous 3 times per day 12/25/2014 1403 12/24/14 0347       Assessment/Plan Ischemic bowel  Septic shock  POD 12/10/5, s/p ex lap with open abdomen and SBR, s/p relook and closure, s/p ex lap and closure with retentions for dehiscence -continue eakin's pouch to measure peritoneal output -SCD/heparin as tolerated -mobilize -full liquid diet -start calorie count, if he's taking enough in, then we will stop his TPN -completed 8 days of zosyn, restarted on 2/20 Cirrhosis -followed at [facility] -lactulose Post op ileus/PCM -TNA -will ensure PO intake is adequate before weaning TNA -fulls today AKI  -3900--->3500--->3200 yesterday, hopefully will continue to decrease -monitor I/Os delirium  PAF  Erby Pian, ANP-BC [facility] Pager [phone](7A-4:30P) For consults and floor pages call  [phone](7A-4:30P)  12/28/2014 8:56 AM

## 2014-12-28 NOTE — Progress Notes (Signed)
PARENTERAL NUTRITION CONSULT NOTE - FOLLOW UP  Pharmacy Consult for TPN Indication: Prolonged ileus  Allergies  Allergen Reactions  . Oxycodone Hives and Other (See Comments)    Hallucinations    Patient Measurements: Height: 6\' 1"  (185.4 cm) Weight: 231 lb 14.8 oz (105.2 kg) IBW/kg (Calculated) : 79.9  Vital Signs: Temp: 98.1 F (36.7 C) (02/23 0700) Temp Source: Oral (02/23 0700) BP: 135/48 mmHg (02/23 0724) Pulse Rate: 90 (02/23 0724) Intake/Output from previous day: 02/22 0701 - 02/23 0700 In: 4240 [P.O.:300; I.V.:1200; IV Piggyback:100; TPN:2640] Out: 5025 [Urine:1825; Stool:3200] Intake/Output from this shift:    Labs:  Recent Labs  12/26/14 1240 12/27/14 0437 12/28/14 0425  WBC 20.1* 20.5* 17.6*  HGB 8.5* 8.7* 8.5*  HCT 24.0* 24.9* 24.2*  PLT 131* 145* 145*     Recent Labs  12/26/14 1100 12/27/14 0437 12/27/14 1500 12/28/14 0425  NA 134* 130*  --  128*  K 4.5 4.1  --  4.2  CL 109 108  --  106  CO2 20 18*  --  17*  GLUCOSE 127* 125*  --  145*  BUN 78* 78*  --  73*  CREATININE 1.59* 1.68*  --  1.51*  LABCREA  --   --  67.13  --   CALCIUM 7.5* 7.2*  --  7.0*  MG 2.9* 2.7*  --  2.5  PHOS 5.0* 4.4  --  3.9  PROT  --  4.7*  --   --   ALBUMIN  --  1.9*  --   --   AST  --  71*  --   --   ALT  --  30  --   --   ALKPHOS  --  112  --   --   BILITOT  --  2.8*  --   --   PREALBUMIN  --  3.5*  --   --   TRIG  --  56  --   --    Estimated Creatinine Clearance: 62.9 mL/min (by C-G formula based on Cr of 1.51).    Recent Labs  12/27/14 2305 12/28/14 0355 12/28/14 0723  GLUCAP 93 135* 97   Insulin Requirements in the past 24 hours:  3 units sensitive SSI, 15 units insulin in TPN  Nutritional Goals: (per RD note on 2/22) 2200-2400 kcal, 120-135g of protein per day  Current Nutrition:  -Clear liquids- no intake charted -Clinimix 5/15 at 14810ml/hr and Lipids at 7310ml/hr, providing 132g protein and 2354 kCal --meets 100% of goals -Resource  Breeze PO TID- provides 250kCal and 9g protein  Admit:  [name] transferred from outside hospital for concern of ischemic bowel. He underwent surgery on 2/11 - exploratory laparotomy, small bowel resection and application of a wound vac. TPN was started in anticipation of prolonged bowel rest/ileus.   GI:  S/P ex. Lap with SBR on 2/11. Also history of pancreatitis and cirrhosis child pugh B. Kyle West:Lactulose, PPI, Rifaximin, spironolactione, Ursodiol. S/P side-to-side small bowel anastomosis and closure of abdomen on 2/13. Abd soft. NG d/c'd- no vomiting charted. POD #5 s/p ex lap with closure. Suspected ascites from wound- wound in tact. Baseline prealbumin low at 6.4, this week it still remains low at 3.5. Starting full liquid diet and calorie count- will stop TPN when taking enough PO. Output decreasing, still with ascites  Endo: No history of diabetes. CBGs 125-151- has 15 units regular insulin in TPN bag along with SSI ordered  Lytes: K 4.2, Na dropped to 128, Mg  2.5, Phos 3.9, CorCa 8.6 - lytes remain stable on electrolyte-free TPN  Renal: AKI s/p surgery. Scr 1.51, UOP picked up to 0.6 ml/kg/hr in last 24h- hydrating with NS @ 100 mL/hr   Pulm:100/RA  Cards: History of CAD. BP soft-nml, HR wnl. Metoprolol IV scheduled  Hepatobil: history of cirrhosis/NASH.alkp phos nml, AST up tp 71, ALT remains wnl, Tbili down at 2.8, INR 1.67 (2/13). TG 56.  Ammonia 27 (2/19; improved on lactulose)  Neuro: Now with post anesthesia hepatic encephalopathy, On lactulose BID, PRN Haldol  ID: Zosyn for intra-abdominal coverage (2/11>>), added Vanc d/t sepsis, now stopped (2/20>>2/23). Afebrile, WBC down to 17.6, CDiff(-) 2/16, PCT 10.29, LA 1.7  Best Practices: SCDs, mouthcare, Hep SQ, PPI   TPN Access: Triple Lumen IJ (placed 2/11) TPN day#:11; (12/17/14 >> )  Plan:  -Continue Clinimix NO LYTES 5/15 at 12mL/hr with 20% IVFE at 26mL/hr. This is providing 100% of patient's goals off  the vent. -Daily MVI and TE in TPN -reduce insulin in TPN bag to 12 units (patient will receive ~10.5 units) -Continue SSI -F/U diet advancement tolerance/calorie, wean TPN when able -BMET, mag and phos in AM  Kyle West, PharmD, BCPS Clinical Pharmacist Pager: [phone] 12/28/2014 8:42 AM

## 2014-12-28 NOTE — Progress Notes (Signed)
Patient to transfer to [phone] report given to receiving nurse all question answered at this time.  Pt. VSS stable with no s/s of distress noted at this time.  Patient stable at transport.

## 2014-12-28 NOTE — Progress Notes (Signed)
Utilization review completed.  

## 2014-12-28 NOTE — Progress Notes (Addendum)
PROGRESS NOTE  Kyle West ZOX:[id number] DOB: [birth year] DOA: January 12, 2015 PCP: Charmaine Downs, MD  Interim History 64yo male with hx HTN, CAD s/p CABG, cirrhosis of unclear etiology (followed at [facility]), reported recent hx pancreatitis. Presented from outside hospital on  01/12/15 with 2 day hx worsening abd pain, nausea and vomiting. CT at outside hospital revealed pneumatosis, hepatic and portal venous gas and infrarenal AAA. He was tx to [facility] for further eval with concern for ischemic bowel. The patient was started on intravenous Zosyn and vancomycin for sepsis and peritonitis secondary to ischemic bowel. Gen. surgery was consulted. The patient underwent a laparotomy on 01/12/2015 with small bowel resection and application of wound VAC(Dr Cornett). Postoperatively, the patient was left intubated. He developed hypotension requiring central venous catheter and vasopressors. The patient was taken back to the OR on 12/13/2014 for a second look operation secondary to ischemia involving his ileum and small bowel resection. The small bowel remained viable with no signs of ischemia. He was weaned off of vasopressors and extubated on 12/20/2014.  The patient's postoperative course was also complicated by atrial fibrillation with RVR.  He was placed on amiodarone drip for short period of time which was d/ced on 2/16 . He has converted back to sinus rhythm. The patient was taken back to surgery on 01/02/2015 for dehiscence of his abdominal wall wound. After surgery on 2/18, he had another episode of Aflutter which has since converted back to sinus. Discontinue postoperative course was complicated by acute encephalopathy that was multifactorial including renal failure, opioids, and sepsis. Assessment/Plan: Ischemic bowel/peritonitis -appreciate surgery followup -POD #12/10--s/p ex lap with open abdomen and SBR, s/p relook and closure -POD#5--s/p ex lap and closure with retentions for  dehiscence -clear liquid diet per surgery -TPN per surgery  Wound Dehiscence -2/18--s/p repair  AKI -Eakin output >4.8L on 2/21-2/22--noted in "flowsheets" under "colostomy" -Eakin output 2.7L on 2/22-2/23 -Secondary to sepsis and fluid shifts in setting of hypotension and large ascites outpt -had hypotension after surgery 2/18 -check FeNa--0.19% -continue IVF in addition to TPN--he had been neg fluid balance (~2-3L before restarting IVF) -no signs of CHF/fluid overload  Septic shock -Secondary to ischemic bowel/peritonitis -Remained stable off vasopressors -Weaned off vasopressors 12/20/2014 -Continue IV Zosyn--previously last day was 12/21/2014  -12/24/14--WBC increase to 28.9--> bloodcultures 2 sets, UA/urine culture, -12/25/14--procalcitonin--9.58,  -12/25/14--lactic acid--1.7-->1.0 (2/23) -12/25/14--restarted abx-->blood and urine cultures remain neg -12/28/14--continue zosyn D#4, d/c vancomycin  Delirium/Acute Encephalopathy -multifactorial including sepsis, opioids, renal failure -minimize opioids if possible  Acute respiratory failure -Patient was intubated February 11>>> February 15 -presently stable on RA-->100% saturations -CXR--bibasilar atelectasis; no consolidations  PAF with RVR -had another episode 2/18 after surgery -spontaneously Back in sinus -CHADS-VASc = 1 -Change patient back to oral metoprolol tartrate when able to tolerate po  Hyperglycemia -Hemoglobin A1c--5.5 -Likely stress-induced and TPN  NASH cirrhosis -Patient follows with hepatology at [facility] -Restart lactulose -Restart rifaximin when able to take po -restart Actigall when able to take po -Ammonia 35-->27  Diarrhea -Cdiff neg on 2/16 Hypertension -Transition IV to po metoprolol tartrate when able to take po  Deconditioning  -PT recommends CIR evaluation--family wants rehab at [facility] Communication:   None today Disposition Plan:   SNF when  stable        Procedures/Studies: Dg Chest Port 1 View  12/24/2014   CLINICAL DATA:  Dyspnea, confusion, fever, history cirrhosis, coronary artery disease, hypertension, pancreatitis, former smoker  EXAM: PORTABLE  CHEST - 1 VIEW  COMPARISON:  Portable exam 1326 hours compared to 12/19/2014  FINDINGS: Interval removal of endotracheal and nasogastric tubes.  LEFT jugular central venous catheter tip projects over SVC.  Normal heart size post CABG.  Mediastinal contours and pulmonary vascularity normal.  Decreased bibasilar atelectasis.  Linear subsegmental atelectasis LEFT upper lobe.  Remaining lungs clear.  No pleural effusion or pneumothorax.  IMPRESSION: Decreased bibasilar and slightly increased LEFT upper lobe subsegmental atelectasis.   Electronically Signed   By: Ulyses Southward M.D.   On: 12/24/2014 14:42   Dg Chest Portable 1 View  12/19/2014   CLINICAL DATA:  Respiratory failure.  EXAM: PORTABLE CHEST - 1 VIEW  COMPARISON:  12/06/2014  FINDINGS: Endotracheal tube is in place with tip just above the level of the carina. Nasogastric tube is in place with tip overlying the level of the stomach. Patient has had median sternotomy and CABG. Left IJ central line tip overlies the superior vena cava.  The heart is enlarged. There is dense opacity of the left lung base consistent with atelectasis or infiltrate. Small bilateral pleural effusions are identified. There has been some improvement in aeration of the right lung base.  IMPRESSION: 1. Postoperative lines and tubes. 2. Slightly improved aeration of the right lung base. 3. Persistent left lung base atelectasis and effusion.   Electronically Signed   By: Norva Pavlov M.D.   On: 12/19/2014 08:04   Dg Chest Port 1 View  12/24/2014   CLINICAL DATA:  Hypoxia/respiratory failure  EXAM: PORTABLE CHEST - 1 VIEW  COMPARISON:  December 17, 2014  FINDINGS: Endotracheal tube tip is 2.2 cm above the carina. Nasogastric tube tip and side port are in the  stomach. Central catheter tip is in the superior vena cava. No pneumothorax. There is left lower lobe consolidation with small left effusion. There is a small right effusion with right base atelectasis. Heart is mildly enlarged. Patient is status post coronary artery bypass grafting. No adenopathy.  IMPRESSION: Tube and catheter positions as described without pneumothorax. Areas of consolidation with effusions. Heart prominent but stable.   Electronically Signed   By: Bretta Bang III M.D.   On: 01/01/2015 14:48   Dg Chest Port 1 View  12/17/2014   CLINICAL DATA:  Respiratory failure.  EXAM: PORTABLE CHEST - 1 VIEW  COMPARISON:  2015/01/14.  FINDINGS: Stable cardiomediastinal silhouette. Endotracheal and nasogastric tubes are unchanged in position. Left internal jugular catheter is also noted with distal tip overlying expected position of the SVC. Status post coronary artery bypass graft. Bibasilar opacities are noted most consistent with subsegmental atelectasis. No pneumothorax is noted. No significant pleural effusion is noted.  IMPRESSION: Stable support apparatus. Stable bibasilar subsegmental atelectasis.   Electronically Signed   By: Lupita Raider, M.D.   On: 12/17/2014 07:53   Dg Chest Port 1 View  01/14/15   CLINICAL DATA:  Status post central line placement  EXAM: PORTABLE CHEST - 1 VIEW  COMPARISON:  Chest film from earlier in the same day  FINDINGS: Postsurgical changes are again seen. An endotracheal tube is now noted 5.2 cm above the carina. A nasogastric catheter is seen extending into the stomach. A left jugular central line is noted with the tip in the mid superior vena cava. No pneumothorax is seen. Bibasilar atelectatic changes are again noted.  IMPRESSION: Tubes and lines as described above. No pneumothorax is noted. Stable bibasilar atelectatic changes are seen.   Electronically Signed  By: Alcide CleverMark  Lukens M.D.   On: 01/02/2015 19:11   Dg Chest Port 1 View  12/24/2014    CLINICAL DATA:  Abdominal aortic aneurysm. Shortness of breath. Coronary artery disease. Pre-op respiratory exam  EXAM: PORTABLE CHEST - 1 VIEW  COMPARISON:  None.  FINDINGS: Low lung volumes are seen. Streaky opacity lung bases is likely due to atelectasis, although pneumonia cannot definitely be excluded. No evidence of pleural effusion. Heart size is within normal limits allowing for low lung volumes. Previous CABG noted.  IMPRESSION: Low lung volumes with probable bibasilar atelectasis, although pneumonia cannot definitely be excluded.   Electronically Signed   By: Myles RosenthalJohn  Stahl M.D.   On: 12/07/2014 13:16         Subjective: Patient is less agitated. Denies any fevers, chills, chest pain, shortness breath, vomiting, diarrhea, abdominal pain. No dysuria.  Objective: Filed Vitals:   12/27/14 1923 12/27/14 2304 12/28/14 0311 12/28/14 0700  BP: 134/51 115/50 149/51   Pulse: 91 90 89   Temp: 98.2 F (36.8 C) 97.9 F (36.6 C) 98.1 F (36.7 C) 98.1 F (36.7 C)  TempSrc: Oral Oral Oral Oral  Resp: 20 17 21    Height:      Weight:   105.2 kg (231 lb 14.8 oz)   SpO2: 100% 100% 100%     Intake/Output Summary (Last 24 hours) at 12/28/14 [id number] Last data filed at 12/28/14 0700  Gross per 24 hour  Intake   4120 ml  Output   3825 ml  Net    295 ml   Weight change: -0.3 kg (-10.6 oz) Exam:   General:  Pt is alert, follows commands appropriately, not in acute distress  HEENT: No icterus, No thrush,  Frio/AT  Cardiovascular: RRR, S1/S2, no rubs, no gallops  Respiratory: CTA bilaterally, no wheezing, no crackles, no rhonchi  Abdomen: Soft/+BS, abdominal wall with clear fluid drainage. non distended, no guarding  Extremities: No edema, No lymphangitis, No petechiae, No rashes, no synovitis  Data Reviewed: Basic Metabolic Panel:  Recent Labs Lab 12/24/14 0335 12/25/14 0500 12/26/14 1100 12/27/14 0437 12/28/14 0425  NA 129* 130* 134* 130* 128*  K 4.6 4.8 4.5 4.1 4.2  CL 101 106  109 108 106  CO2 22 20 20  18* 17*  GLUCOSE 171* 135* 127* 125* 145*  BUN 51* 68* 78* 78* 73*  CREATININE 1.52* 1.72* 1.59* 1.68* 1.51*  CALCIUM 7.5* 7.1* 7.5* 7.2* 7.0*  MG 2.7* 3.0* 2.9* 2.7* 2.5  PHOS 5.1* 6.0* 5.0* 4.4 3.9   Liver Function Tests:  Recent Labs Lab 12/17/2014 0340 12/25/14 0500 12/27/14 0437  AST 41* 62* 71*  ALT 16 23 30   ALKPHOS 89 90 112  BILITOT 3.2* 3.1* 2.8*  PROT 4.5* 4.2* 4.7*  ALBUMIN 2.1* 2.3* 1.9*   No results for input(s): LIPASE, AMYLASE in the last 168 hours.  Recent Labs Lab 12/21/14 1919 12/24/14 1411  AMMONIA 35* 27   CBC:  Recent Labs Lab 12/25/14 0500 12/25/14 1700 12/26/14 1240 12/27/14 0437 12/28/14 0425  WBC 26.8* 23.8* 20.1* 20.5* 17.6*  NEUTROABS  --   --   --  16.2*  --   HGB 9.0* 8.8* 8.5* 8.7* 8.5*  HCT 25.6* 25.1* 24.0* 24.9* 24.2*  MCV 95.9 98.0 96.4 96.1 95.3  PLT 136* 126* 131* 145* 145*   Cardiac Enzymes: No results for input(s): CKTOTAL, CKMB, CKMBINDEX, TROPONINI in the last 168 hours. BNP: Invalid input(s): POCBNP CBG:  Recent Labs Lab 12/27/14 1559 12/27/14 1927 12/27/14  2305 12/28/14 0355 12/28/14 0723  GLUCAP 136* 107* 93 135* 97    Recent Results (from the past 240 hour(s))  Clostridium Difficile by PCR     Status: None   Collection Time: 12/21/14  3:32 AM  Result Value Ref Range Status   C difficile by pcr NEGATIVE NEGATIVE Final  Culture, blood (routine x 2)     Status: None (Preliminary result)   Collection Time: 12/25/14 11:00 AM  Result Value Ref Range Status   Specimen Description BLOOD LEFT HAND  Final   Special Requests BOTTLES DRAWN AEROBIC ONLY 7CC  Final   Culture   Final           BLOOD CULTURE RECEIVED NO GROWTH TO DATE CULTURE WILL BE HELD FOR 5 DAYS BEFORE ISSUING A FINAL NEGATIVE REPORT Performed at [facility]    Report Status PENDING  Incomplete  Urine culture     Status: None   Collection Time: 12/25/14 11:00 AM  Result Value Ref Range Status   Specimen  Description URINE, RANDOM  Final   Special Requests NONE  Final   Colony Count   Final    1,000 COLONIES/ML Performed at [facility]    Culture   Final    INSIGNIFICANT GROWTH Performed at [facility]    Report Status 12/26/2014 FINAL  Final  Culture, blood (routine x 2)     Status: None (Preliminary result)   Collection Time: 12/25/14 11:10 AM  Result Value Ref Range Status   Specimen Description BLOOD LEFT HAND  Final   Special Requests BOTTLES DRAWN AEROBIC ONLY 5CC  Final   Culture   Final           BLOOD CULTURE RECEIVED NO GROWTH TO DATE CULTURE WILL BE HELD FOR 5 DAYS BEFORE ISSUING A FINAL NEGATIVE REPORT Performed at [facility]    Report Status PENDING  Incomplete     Scheduled Meds: . chlorhexidine  15 mL Mouth Rinse BID  . feeding supplement (RESOURCE BREEZE)  1 Container Oral TID BM  . heparin subcutaneous  5,000 Units Subcutaneous 3 times per day  . insulin aspart  0-9 Units Subcutaneous TID WC  . lactulose  20 g Oral BID  . metoprolol  2.5 mg Intravenous Q6H  . piperacillin-tazobactam (ZOSYN)  IV  3.375 g Intravenous Q8H  . vancomycin  1,250 mg Intravenous Q24H   Continuous Infusions: . sodium chloride 100 mL/hr at 12/28/14 0547  . TPN (CLINIMIX) Adult without lytes 110 mL/hr at 12/27/14 1710   And  . fat emulsion 240 mL (12/27/14 1709)  . sodium chloride 0.9 % 1,000 mL infusion 125 mL/hr at 12/24/14 1300     Kyle Baumler, DO  [facility] Pager [phone]  If 7PM-7AM, please contact night-coverage [email or web address] Password [email or web address] 12/28/2014, 8:14 AM   LOS: 12 days

## 2014-12-28 NOTE — Progress Notes (Signed)
Speech Language Pathology Treatment: Cognitive-Linquistic  Patient Details Name: Kyle West MRN: [id number] DOB: December 24, 1950 Today's Date: 12/28/2014 Time: [id number] SLP Time Calculation (min) (ACUTE ONLY): 21 min  Assessment / Plan / Recommendation Clinical Impression  Pt seen for f/u cognitive treatment. Today he is alert, but requires Mod-Max cues for redirection to verbal tasks/questions for accurate responses. He is oriented to name and birthday independently. With Max cues from SLP for basic problem solving and utilization of environmental clues, he shows orientation to age and location. With SLP model, pt wrote down his location to utilize an external memory aid, which he then used ~5 minutes later with Min cues. Continue to recommend CIR.   HPI HPI: 64yo male with hx HTN, CAD s/p CABG, cirrhosis of unclear etiology ([facility]), reported recent hx pancreatitis. Presented to OSH 2/11 with 2 day hx worsening abd pain, nausea and vomiting. CT at outside hospital revealed pneumatosis, hepatic and portal venous gas and infrarenal AAA. [facility] with concern for ischemic bowel. Underwent laparotomy  2/11; reexploration 2/13; repair of dehiscence of his abdominal wall wound 2/18; VDRF 2/11-2/15; TNA.  Started on clears 2/16.  Post-op confusion, deconditioning.  PT recommending CIR.     Pertinent Vitals Pain Assessment: Faces Faces Pain Scale: No hurt  SLP Plan  Continue with current plan of care    Recommendations      Follow up Recommendations: Inpatient Rehab Plan: Continue with current plan of care       Maxcine HamLaura Paiewonsky, M.A. CCC-SLP [phone]  Maxcine Hamaiewonsky, Kyle West 12/28/2014, 3:26 PM

## 2014-12-28 NOTE — Progress Notes (Signed)
   12/28/14 2215  BiPAP/CPAP/SIPAP  BiPAP/CPAP/SIPAP Pt Type Adult  Mask Type Full face mask  Mask Size Medium  Set Rate 0 breaths/min  Respiratory Rate 18 breaths/min  IPAP 12 cmH20  EPAP 12 cmH2O  Flow Rate 2 lpm  BiPAP/CPAP/SIPAP CPAP  Patient Home Equipment No  Auto Titrate No  Patient placed on CPAP per home settings.

## 2014-12-28 NOTE — Consult Note (Addendum)
WOC wound consult note Reason for Consult: Current Eakin pouch is leaking.  Bedside nurse applied on previous Friday. CCS following for assessment and plan of care to abd wound  Wound type: Full thickness post-op abd wound to midline Measurement: Approx 20X5X2cm Wound bed: Beefy red Drainage (amount, consistency, odor) Large amt yellow drainage Periwound: Retention sutures surrounding wound, skin slightly macerated around wound edges Dressing procedure/placement/frequency: Applied large Eakin pouch to contain drainage.  Supplies and directions left at bedside for staff nurses if leakage occurs. Cammie Mcgeeawn Kandie Keiper MSN, RN, CWOCN, BurnsWCN-AP, CNS [phone]

## 2014-12-28 NOTE — Progress Notes (Signed)
NUTRITION FOLLOW UP / CONSULT  Intervention:    Continue TPN dosing per pharmacy.  Calorie count x 3 days.  Change supplement to Ensure Complete PO BID, each supplement provides 350 kcal and 13 grams of protein.  Diet advancement per MD.  Nutrition Dx:   Inadequate oral intake related to inability to eat as evidenced by recent bowel surgery and clear liquid diet; ongoing  Goal:   Intake to meet >90% of estimated nutrition needs.  Monitor:   TPN tolerance, diet advancement, labs, weight trend.  Assessment:   64yo male with hx HTN, CAD s/p CABG, cirrhosis, reported recent hx pancreatitis. Presented with 2 day hx worsening abd pain, nausea and vomiting.  SP ex lap with small bowel resection and application of wound VAC on 2/11. Now with Eakin pouch to abdominal wound. Diet advancing to full liquids today.   Calorie count has been ordered. Per discussion with RN, patient consumed bites of lunch meal today. He has been combative and lethargic, so intake has been minimal. Has not drank more than a sip or two of Resource Breeze supplements.  Patient is receiving TPN with Clinimix 5/15 @ 110 ml/hr and lipids @ 10 ml/hr. Provides 2354 kcal, and 132 grams protein per day. Meets 100% minimum estimated energy needs and 100% minimum estimated protein needs.  Height: Ht Readings from Last 1 Encounters:  12/26/14  (1.854 m)    Weight Status:   Wt Readings from Last 1 Encounters:  12/28/14 231 lb 14.8 oz (105.2 kg)    12/21/14 252 lb 10.4 oz (114.6 kg)       12/17/14 235 lb 14.3 oz (107 kg)       Re-estimated needs:  Kcal: 2200-2400 Protein: 120-135 grams Fluid: >2.2 L  Skin: abdominal incision, deep tissue injury on nose due to NGT  Diet Order: TPN (CLINIMIX) Adult without lytes Diet full liquid TPN (CLINIMIX) Adult without lytes   Intake/Output Summary (Last 24 hours) at 12/28/14 1434 Last data filed at 12/28/14 1257  Gross per 24 hour  Intake   3800 ml   Output   3825 ml  Net    -25 ml    Last BM: 2/22   Labs:   Recent Labs Lab 12/26/14 1100 12/27/14 0437 12/28/14 0425  NA 134* 130* 128*  K 4.5 4.1 4.2  CL 109 108 106  CO2 20 18* 17*  BUN 78* 78* 73*  CREATININE 1.59* 1.68* 1.51*  CALCIUM 7.5* 7.2* 7.0*  MG 2.9* 2.7* 2.5  PHOS 5.0* 4.4 3.9  GLUCOSE 127* 125* 145*    CBG (last 3)   Recent Labs  12/27/14 2305 12/28/14 0355 12/28/14 0723  GLUCAP 93 135* 97    Scheduled Meds: . chlorhexidine  15 mL Mouth Rinse BID  . feeding supplement (RESOURCE BREEZE)  1 Container Oral TID BM  . heparin subcutaneous  5,000 Units Subcutaneous 3 times per day  . insulin aspart  0-9 Units Subcutaneous TID WC  . lactulose  20 g Oral BID  . metoprolol  2.5 mg Intravenous Q6H  . piperacillin-tazobactam (ZOSYN)  IV  3.375 g Intravenous Q8H    Continuous Infusions: . sodium chloride 100 mL/hr at 12/28/14 0547  . TPN (CLINIMIX) Adult without lytes 110 mL/hr at 12/27/14 1710   And  . fat emulsion 240 mL (12/27/14 1709)  . TPN (CLINIMIX) Adult without lytes     And  . fat emulsion    . sodium chloride 0.9 % 1,000 mL infusion 125  mL/hr at 12/24/14 1300    Joaquin CourtsKimberly Peace Jost, RD, LDN, CNSC Pager [phone] After Hours Pager [phone]

## 2014-12-28 NOTE — Progress Notes (Signed)
Inpatient Rehabilitation  I met with the patient and his wife at the bedside to discuss pt's potential rehabilitation needs once he is medically stable.  Wife indicated that pt. was diverted from several hospitals closer to them when he became ill.  She currently is driving 1 1/2 hours one way daily to get to [facility] from [place].  Wife indicates she would like to have pt. transitioned nearer home when his condition permits.  I discussed the role of IP Rehab, and she indicates she would like rehab closer to home.  We will follow at a distance and will submit for insurance authorization if family needs IP rehab here at [facility].  I discussed  wife's preferences with Marvetta Gibbons, RNCM. Please call if questions.  [place] Admissions Coordinator Cell [phone] Office [phone]

## 2014-12-29 ENCOUNTER — Inpatient Hospital Stay (HOSPITAL_COMMUNITY): Payer: BLUE CROSS/BLUE SHIELD

## 2014-12-29 DIAGNOSIS — Z452 Encounter for adjustment and management of vascular access device: Secondary | ICD-10-CM

## 2014-12-29 DIAGNOSIS — Z4659 Encounter for fitting and adjustment of other gastrointestinal appliance and device: Secondary | ICD-10-CM

## 2014-12-29 DIAGNOSIS — J9690 Respiratory failure, unspecified, unspecified whether with hypoxia or hypercapnia: Secondary | ICD-10-CM

## 2014-12-29 DIAGNOSIS — R0600 Dyspnea, unspecified: Secondary | ICD-10-CM

## 2014-12-29 LAB — HEPATIC FUNCTION PANEL
ALBUMIN: 1.7 g/dL — AB (ref 3.5–5.2)
ALT: 29 U/L (ref 0–53)
AST: 62 U/L — AB (ref 0–37)
Alkaline Phosphatase: 121 U/L — ABNORMAL HIGH (ref 39–117)
Bilirubin, Direct: 1.1 mg/dL — ABNORMAL HIGH (ref 0.0–0.5)
Indirect Bilirubin: 1.5 mg/dL — ABNORMAL HIGH (ref 0.3–0.9)
Total Bilirubin: 2.6 mg/dL — ABNORMAL HIGH (ref 0.3–1.2)
Total Protein: 4.4 g/dL — ABNORMAL LOW (ref 6.0–8.3)

## 2014-12-29 LAB — BLOOD GAS, ARTERIAL
Acid-base deficit: 14.1 mmol/L — ABNORMAL HIGH (ref 0.0–2.0)
Bicarbonate: 10.9 mEq/L — ABNORMAL LOW (ref 20.0–24.0)
DRAWN BY: 22766
FIO2: 0.21 %
O2 SAT: 93.8 %
PH ART: 7.321 — AB (ref 7.350–7.450)
PO2 ART: 74 mmHg — AB (ref 80.0–100.0)
Patient temperature: 98.6
TCO2: 11.6 mmol/L (ref 0–100)
pCO2 arterial: 21.7 mmHg — ABNORMAL LOW (ref 35.0–45.0)

## 2014-12-29 LAB — LACTIC ACID, PLASMA: LACTIC ACID, VENOUS: 1.3 mmol/L (ref 0.5–2.0)

## 2014-12-29 LAB — GLUCOSE, CAPILLARY
GLUCOSE-CAPILLARY: 152 mg/dL — AB (ref 70–99)
GLUCOSE-CAPILLARY: 151 mg/dL — AB (ref 70–99)
Glucose-Capillary: 157 mg/dL — ABNORMAL HIGH (ref 70–99)

## 2014-12-29 LAB — CBC
HEMATOCRIT: 28.1 % — AB (ref 39.0–52.0)
HEMOGLOBIN: 9.6 g/dL — AB (ref 13.0–17.0)
MCH: 33.6 pg (ref 26.0–34.0)
MCHC: 34.2 g/dL (ref 30.0–36.0)
MCV: 98.3 fL (ref 78.0–100.0)
Platelets: 181 10*3/uL (ref 150–400)
RBC: 2.86 MIL/uL — AB (ref 4.22–5.81)
RDW: 16.6 % — ABNORMAL HIGH (ref 11.5–15.5)
WBC: 25 10*3/uL — AB (ref 4.0–10.5)

## 2014-12-29 LAB — BASIC METABOLIC PANEL
ANION GAP: 6 (ref 5–15)
BUN: 72 mg/dL — AB (ref 6–23)
CHLORIDE: 105 mmol/L (ref 96–112)
CO2: 13 mmol/L — AB (ref 19–32)
CREATININE: 1.58 mg/dL — AB (ref 0.50–1.35)
Calcium: 7 mg/dL — ABNORMAL LOW (ref 8.4–10.5)
GFR calc Af Amer: 52 mL/min — ABNORMAL LOW (ref >90)
GFR calc non Af Amer: 45 mL/min — ABNORMAL LOW (ref >90)
GLUCOSE: 148 mg/dL — AB (ref 70–99)
Potassium: 4 mmol/L (ref 3.5–5.1)
Sodium: 124 mmol/L — ABNORMAL LOW (ref 135–145)

## 2014-12-29 LAB — MAGNESIUM: Magnesium: 2.2 mg/dL (ref 1.5–2.5)

## 2014-12-29 LAB — MRSA PCR SCREENING: MRSA by PCR: NEGATIVE

## 2014-12-29 LAB — PHOSPHORUS: PHOSPHORUS: 4.7 mg/dL — AB (ref 2.3–4.6)

## 2014-12-29 LAB — PROCALCITONIN: PROCALCITONIN: 5.71 ng/mL

## 2014-12-29 LAB — TROPONIN I: Troponin I: <0.03 ng/mL (ref <0.031)

## 2014-12-29 MED ORDER — STERILE WATER FOR INJECTION IV SOLN
INTRAVENOUS | Status: AC
  Administered 2014-12-29 – 2014-12-30 (×2): via INTRAVENOUS
  Filled 2014-12-29 (×2): qty 9.7

## 2014-12-29 MED ORDER — SODIUM CHLORIDE 0.9 % IJ SOLN
10.0000 mL | INTRAMUSCULAR | Status: DC | PRN
  Administered 2014-12-29: 10 mL

## 2014-12-29 MED ORDER — IOHEXOL 300 MG/ML  SOLN
25.0000 mL | INTRAMUSCULAR | Status: AC
  Administered 2014-12-29 (×2): 25 mL via ORAL

## 2014-12-29 MED ORDER — TRACE MINERALS CR-CU-F-FE-I-MN-MO-SE-ZN IV SOLN
INTRAVENOUS | Status: AC
  Administered 2014-12-29: 17:00:00 via INTRAVENOUS
  Filled 2014-12-29: qty 2640

## 2014-12-29 MED ORDER — FAT EMULSION 20 % IV EMUL
240.0000 mL | INTRAVENOUS | Status: AC
  Administered 2014-12-29: 240 mL via INTRAVENOUS
  Filled 2014-12-29: qty 250

## 2014-12-29 MED ORDER — SODIUM CHLORIDE 4 MEQ/ML IV SOLN: INTRAVENOUS | Status: DC

## 2014-12-29 MED ORDER — LORAZEPAM 2 MG/ML IJ SOLN
1.0000 mg | Freq: Once | INTRAMUSCULAR | Status: AC
  Administered 2014-12-29: 1 mg via INTRAVENOUS
  Filled 2014-12-29: qty 1

## 2014-12-29 NOTE — Progress Notes (Signed)
[facility]ia Perin Progress Note 6 Days Post-Op  Subjective: Patient breathing heavily.  Say he has no more abdominal discomfort.  Objective: Vital signs in last 24 hours: Temp:  [97.6 F (36.4 C)-98.4 F (36.9 C)] 97.6 F (36.4 C) (02/24 0616) Pulse Rate:  [92-105] 94 (02/24 0616) Resp:  [17-21] 20 (02/24 0616) BP: (106-119)/(38-52) 106/51 mmHg (02/24 0616) SpO2:  [85 %-100 %] 100 % (02/24 0616) Weight:  [106.542 kg (234 lb 14.1 oz)] 106.542 kg (234 lb 14.1 oz) (02/23 2109) Last BM Date: 12/28/14  Intake/Output from previous day: 02/23 0701 - 02/24 0700 In: 5164.8 [I.V.:2325; IV Piggyback:50; TPN:2789.8] Out: 3900 [Urine:975; Drains:175; Stool:2750] Intake/Output this shift:    General: No acute distress, but definitely breathing more heavily  Lungs: Clear.  No rales or rhonchi  Abd: Non-tender, good bowel sounds.  Still mostly clear ascitic drainage, but there may be a slight bilious tinge to the fluid.    Extremities: No changes  Neuro: Intact  Lab Results:   WBC 25.0K BMET  Recent Labs  12/28/14 0425 12/29/14 0601  NA 128* 124*  K 4.2 4.0  CL 106 105  CO2 17* 13*  GLUCOSE 145* 148*  BUN 73* 72*  CREATININE 1.51* 1.58*  CALCIUM 7.0* 7.0*   PT/INR No results for input(s): LABPROT, INR in the last 72 hours. ABG No results for input(s): PHART, HCO3 in the last 72 hours.  Invalid input(s): PCO2, PO2  Studies/Results: No results found.  Anti-infectives: Anti-infectives    Start     Dose/Rate Route Frequency Ordered Stop   12/26/14 0930  vancomycin (VANCOCIN) 1,250 mg in sodium chloride 0.9 % 250 mL IVPB  Status:  Discontinued     1,250 mg 166.7 mL/hr over 90 Minutes Intravenous Every 24 hours 12/25/14 0912 12/28/14 0850   12/25/14 1000  piperacillin-tazobactam (ZOSYN) IVPB 3.375 g     3.375 g 12.5 mL/hr over 240 Minutes Intravenous Every 8 hours 12/25/14 0921     12/25/14 0930  vancomycin (VANCOCIN) 1,750 mg in sodium chloride 0.9 % 500 mL IVPB     1,750  mg 250 mL/hr over 120 Minutes Intravenous  Once 12/25/14 0912 12/25/14 1208   12/25/14 0900  piperacillin-tazobactam (ZOSYN) IVPB 3.375 g  Status:  Discontinued     3.375 g 100 mL/hr over 30 Minutes Intravenous 3 times per day 12/25/14 0852 12/25/14 0921   03-14-2015 2200  [MAR Hold]  rifaximin (XIFAXAN) tablet 550 mg  Status:  Discontinued     (MAR Hold since 03-14-2015 1721)   550 mg Oral 2 times daily 03-14-2015 1634 03-14-2015 1726   01/01/2015 1430  piperacillin-tazobactam (ZOSYN) IVPB 3.375 g     3.375 g 12.5 mL/hr over 240 Minutes Intravenous 3 times per day 12/24/2014 1403 12/24/14 0347      Assessment/Plan: s/p Procedure(s): EXPLORATORY LAPAROTOMY WITH CLOSURE OF EVISCERATION Worsening overall condition with possible change in ascitic drainage.  Would get CT with oral contrast only which can be given through an NGT if aspiration is a concern.  LOS: 13 days   Marta LamasJames O. Gae BonWyatt, III, MD, FACS [phone]--pager [phone]--office [facility] 12/29/2014

## 2014-12-29 NOTE — Progress Notes (Signed)
Inpatient Rehabilitation  I continue to follow at a distance.  I note pt. Has worsened and he has been transferred to 2H02 with breathing difficulties.  My co-worker Ottie GlazierBarbara Boyette will follow this case in my absence over the next several days.  She can be reached at [phone].  Please call if questions.  Weldon PickingSusan Roselina Burgueno PT Inpatient Rehab Admissions Coordinator Cell [phone] Office [phone]

## 2014-12-29 NOTE — Progress Notes (Signed)
PARENTERAL NUTRITION CONSULT NOTE - FOLLOW UP  Pharmacy Consult for TPN Indication: Prolonged ileus  Allergies  Allergen Reactions  . Oxycodone Hives and Other (See Comments)    Hallucinations    Patient Measurements: Height:  (185.4 cm) Weight: 234 lb 14.1 oz (106.542 kg) IBW/kg (Calculated) : 79.9  Vital Signs: Temp: 97.6 F (36.4 C) (02/24 0616) Temp Source: Oral (02/24 0616) BP: 106/51 mmHg (02/24 0616) Pulse Rate: 94 (02/24 0616) Intake/Output from previous day: 02/23 0701 - 02/24 0700 In: 5164.8 [I.V.:2325; IV Piggyback:50; TPN:2789.8] Out: 3900 [Urine:975; Drains:175; Stool:2750] Intake/Output from this shift:    Labs:  Recent Labs  12/27/14 0437 12/28/14 0425 12/29/14 0601  WBC 20.5* 17.6* 25.0*  HGB 8.7* 8.5* 9.6*  HCT 24.9* 24.2* 28.1*  PLT 145* 145* 181     Recent Labs  12/27/14 0437 12/27/14 1500 12/28/14 0425 12/29/14 0601  NA 130*  --  128* 124*  K 4.1  --  4.2 4.0  CL 108  --  106 105  CO2 18*  --  17* 13*  GLUCOSE 125*  --  145* 148*  BUN 78*  --  73* 72*  CREATININE 1.68*  --  1.51* 1.58*  LABCREA  --  67.13  --   --   CALCIUM 7.2*  --  7.0* 7.0*  MG 2.7*  --  2.5 2.2  PHOS 4.4  --  3.9 4.7*  PROT 4.7*  --   --   --   ALBUMIN 1.9*  --   --   --   AST 71*  --   --   --   ALT 30  --   --   --   ALKPHOS 112  --   --   --   BILITOT 2.8*  --   --   --   PREALBUMIN 3.5*  --   --   --   TRIG 56  --   --   --    Estimated Creatinine Clearance: 60.5 mL/min (by C-G formula based on Cr of 1.58).    Recent Labs  12/28/14 1550 12/28/14 2108 12/29/14 0805  GLUCAP 124* 157* 157*   Insulin Requirements in the past 24 hours:  4 units sensitive SSI, 12 units insulin in TPN  Nutritional Goals: (per RD note on 2/22) 2200-2400 kcal, 120-135g of protein per day  Current Nutrition:  -full liquids- having bites of meals, only a sip or two of supplements- calorie count in process -Clinimix 5/15 at 176ml/hr and Lipids at 13ml/hr,  providing 132g protein and 2354 kCal (meets 100% of goals) -Ensure complete BID- each provides 350kCal and 13g protein  Admit:  [name] transferred from outside hospital for concern of ischemic bowel. He underwent surgery on 2/11 - exploratory laparotomy, small bowel resection and application of a wound vac. TPN was started in anticipation of prolonged bowel rest/ileus.   GI:  S/P ex. Lap with SBR on 2/11. Also history of pancreatitis and cirrhosis child pugh B. ZOX:[id number], PPI, Rifaximin, spironolactione, Ursodiol. S/P side-to-side small bowel anastomosis and closure of abdomen on 2/13. Abd soft. NG d/c'd- no vomiting charted. POD #6 s/p ex lap with closure. Suspected ascites from wound- wound in tact with Eakin pouch to contain drainage. Baseline prealbumin low at 6.4, this week it still remains low at 3.5. Ongoing full liquid diet and calorie count- will wean TPN when taking enough PO. Patient tells me he has been having " a little bit" of food  Endo: No  history of diabetes. CBGs 124-157- has 12 units (receiving ~10.5units) regular insulin in TPN bag along with SSI ordered  Lytes: K 4, Na dropped to 124, Mg 2.2, Phos incr 4.7, CorCa 8.6 - lytes remain stable on electrolyte-free TPN  Renal: AKI s/p surgery. Scr 1.58, UOP 0.5 ml/kg/hr in last 24h- currently hydrating with NS @ 10 mL/hr   Pulm:100/RA  Cards: History of CAD. BP soft-nml, HR wnl. Metoprolol IV scheduled  Hepatobil: history of cirrhosis/NASH.alkp phos nml, AST up tp 71, ALT remains wnl, Tbili down at 2.8, INR 1.67 (2/13). TG 56.  Ammonia 27 (2/19; improved on lactulose)  Neuro: Now with post anesthesia hepatic encephalopathy, On lactulose BID, PRN Haldol  ID: Zosyn for intra-abdominal coverage (2/11>>), added Vanc d/t sepsis, now stopped (2/20>>2/23). Afebrile, WBC back up to 24, CDiff(-) 2/16, PCT 10.29, LA 1.7  Best Practices: SCDs, mouthcare, Hep SQ, PPI   TPN Access: Triple Lumen IJ (placed 2/11) TPN  day#:12; (12/17/14 >> )  Plan:  -increase MIVF to 2730mL/hr to attempt to help with low Na -Continue Clinimix NO LYTES 5/15 at 13710mL/hr with 20% IVFE at 1410mL/hr. This is providing 100% of patient's goals -Daily MVI and TE in TPN -12 units regular insulin in TPN bag (patient will receive ~10.5 units) -Continue SSI -F/U diet advancement tolerance/calorie, wean TPN when able -full TPN labs in the morning as per protocol  Honi Name D. Andrianna Manalang, PharmD, BCPS Clinical Pharmacist Pager: [phone] 12/29/2014 8:51 AM

## 2014-12-29 NOTE — Significant Event (Signed)
Rapid Response Event Note  Overview: Time Called: 0951 Arrival Time: 0955 Event Type: Respiratory, Other (Comment)  Initial Focused Assessment: Patient with labored breathing,  RR 32  O2 sat 97% on RA. Lung sounds decreased in bases, heart tones regular.  BP 117/46  SR 98  Temp 98.6 Open abdomen with clear dressing, to gravity drain.  Patient alert but weak, denies pain.  Interventions: NG tube placed,  Portable KUB for confirmation.  Plan for contrast for abd CT. Labs drawn:  ABG, lactic, troponin Wife updated via phone Patient transferred to 2H01 RN at bedside, updated on patient status  Event Summary: Name of Physician Notified: Dr Lindie SpruceWyatt at bedside prior to arrival at      at    Outcome: Transferred (Comment) [phone](2H02)  Event End Time: 1059  Kyle West, Kyle West

## 2014-12-29 NOTE — Progress Notes (Signed)
12/29/14 0320 Patient awake and alert,up all night,attempting multiple times to get out of bed and attempting multiple times and succeeded  to take telemetry off.Safety mitt applied but patient able to take them off.Wife at bedside.Haldol  given as ordered with no effect.Will continue to monitor. Maryjayne Kleven, Drinda Buttsharito Joselita, RCharity fundraiser

## 2014-12-29 NOTE — Progress Notes (Signed)
PROGRESS NOTE  Kyle West ZOX:[id number] DOB: 03-22-1951 DOA: 12/07/2014 PCP: Charmaine Downs, MD  Interim History 64yo male with hx HTN, CAD s/p CABG, cirrhosis of unclear etiology (followed at [facility]), reported recent hx pancreatitis. Presented from outside hospital on  12/30/2014 with 2 day hx worsening abd pain, nausea and vomiting. CT at outside hospital revealed pneumatosis, hepatic and portal venous gas and infrarenal AAA. He was tx to [facility] for further eval with concern for ischemic bowel. The patient was started on intravenous Zosyn and vancomycin for sepsis and peritonitis secondary to ischemic bowel. Gen. surgery was consulted. The patient underwent a laparotomy on 12/25/2014 with small bowel resection and application of wound VAC(Dr Cornett). Postoperatively, the patient was left intubated. He developed hypotension requiring central venous catheter and vasopressors. The patient was taken back to the OR on 01/09/15 for a second look operation secondary to ischemia involving his ileum and small bowel resection. The small bowel remained viable with no signs of ischemia. He was weaned off of vasopressors and extubated on 12/20/2014.  The patient's postoperative course was also complicated by atrial fibrillation with RVR.  He was placed on amiodarone drip for short period of time which was d/ced on 2/16 . He has converted back to sinus rhythm. The patient was taken back to surgery on 12/12/2014 for dehiscence of his abdominal wall wound. After surgery on 2/18, he had another episode of Aflutter which has since converted back to sinus. Discontinue postoperative course was complicated by acute encephalopathy that was multifactorial including renal failure, opioids, and sepsis.   Assessment/Plan: Ischemic bowel/peritonitis Repeat CT scan today has a white count has increased, patient appears to be septic We'll check lactic acid, unable to place NG tube, discussed with Dr. Lindie Spruce )  radiology Will give low-dose IV contrast to further evaluate any intra-abdominal process -POD #12/10--s/p ex lap with open abdomen and SBR, s/p relook and closure -POD#5--s/p ex lap and closure with retentions for dehiscence Patient made nothing by mouth -Continue TPN per surgery  Tachypnea, ABG shows significant metabolic acidosis, pH 7.32, PCO2 of 21.7 Bicarbonate of 13, metabolic acidosis could be secondary to azotemia The patient on low-dose sodium bicarbonate infusion  Wound Dehiscence -2/18--s/p repair  Acute kidney injury-baseline 1.0 Likely prerenal -Secondary to sepsis and fluid shifts in setting of hypotension and large ascites outpt -had hypotension after surgery 2/18 Start the patient on sodium bicarbonate infusion, in addition to TPN-- -no signs of CHF/fluid overload  Hyponatremia-possibly secondary to dehydration, hopefully will improve with sodium bicarbonate infusion  Septic shock -Secondary to ischemic bowel/peritonitis -Remained stable off vasopressors -Weaned off vasopressors 12/20/2014 -Continue IV Zosyn--previously last day was 01/01/2015  -12/24/14--WBC increase to 28.9--> bloodcultures 2 sets, UA/urine culture, -12/25/14--procalcitonin--9.58,  -12/25/14--lactic acid--1.7-->1.0 (2/23) -12/25/14--restarted abx-->blood and urine cultures remain neg -12/28/14--continue zosyn D#5  d/c vancomycin   Delirium/Acute Encephalopathy -multifactorial including sepsis, opioids, renal failure -minimize opioids if possible  Acute respiratory failure -Patient was intubated February 11>>> February 15 -presently stable on RA-->100% saturations -CXR--bibasilar atelectasis; no consolidations  PAF with RVR -had another episode 2/18 after surgery -spontaneously Back in sinus -CHADS-VASc = 1 -Change patient back to oral metoprolol tartrate when able to tolerate po  Hyperglycemia -Hemoglobin A1c--5.5 -Likely stress-induced and TPN  NASH cirrhosis -Patient follows  with hepatology at [facility] -Restart lactulose -Restart rifaximin when able to take po -restart Actigall when able to take po -Ammonia 35-->27  Diarrhea -Cdiff neg on 2/16   Hypertension -  Transition IV to po metoprolol tartrate when able to take po  Deconditioning  -PT recommends CIR evaluation--family wants rehab at [facility] Communication:   None today Disposition Plan:   Transfer to stepdown        Procedures/Studies: Dg Abd 1 View  12/29/2014   CLINICAL DATA:  Nasogastric tube placement.  EXAM: ABDOMEN - 1 VIEW  COMPARISON:  None.  FINDINGS: A nasogastric tube is seen with tip in the distal esophagus near the gastroesophageal junction. Mild dilatation of small bowel and colon is seen likely due to ileus. Abdominal wall retention sutures noted.  IMPRESSION: Nasogastric tube tip overlies the distal esophagus near the GE junction. Mild ileus pattern.   Electronically Signed   By: Myles Rosenthal M.D.   On: 12/29/2014 12:03   Dg Chest Port 1 View  12/29/2014   CLINICAL DATA:  64 year old male with acute shortness of Breath. Initial encounter.  EXAM: PORTABLE CHEST - 1 VIEW  COMPARISON:  12/24/2014 and earlier.  FINDINGS: Portable AP semi upright view at 0921 hours. Lower lung volumes. Stable cardiac size and mediastinal contours. Sequelae of CABG. Stable left IJ central line. No pneumothorax. No pleural effusion or consolidation. Stable pulmonary vascularity. No confluent pulmonary opacity. Increased gaseous distension of the stomach.  IMPRESSION: Lower lung volumes otherwise no acute cardiopulmonary abnormality.  Increased gaseous distension of bowel in the left upper abdomen.   Electronically Signed   By: Odessa Fleming M.D.   On: 12/29/2014 09:40   Dg Chest Port 1 View  12/24/2014   CLINICAL DATA:  Dyspnea, confusion, fever, history cirrhosis, coronary artery disease, hypertension, pancreatitis, former smoker  EXAM: PORTABLE CHEST - 1 VIEW  COMPARISON:  Portable exam 1326 hours compared to  12/19/2014  FINDINGS: Interval removal of endotracheal and nasogastric tubes.  LEFT jugular central venous catheter tip projects over SVC.  Normal heart size post CABG.  Mediastinal contours and pulmonary vascularity normal.  Decreased bibasilar atelectasis.  Linear subsegmental atelectasis LEFT upper lobe.  Remaining lungs clear.  No pleural effusion or pneumothorax.  IMPRESSION: Decreased bibasilar and slightly increased LEFT upper lobe subsegmental atelectasis.   Electronically Signed   By: Ulyses Southward M.D.   On: 12/24/2014 14:42   Dg Chest Portable 1 View  12/19/2014   CLINICAL DATA:  Respiratory failure.  EXAM: PORTABLE CHEST - 1 VIEW  COMPARISON:  12-21-2014  FINDINGS: Endotracheal tube is in place with tip just above the level of the carina. Nasogastric tube is in place with tip overlying the level of the stomach. Patient has had median sternotomy and CABG. Left IJ central line tip overlies the superior vena cava.  The heart is enlarged. There is dense opacity of the left lung base consistent with atelectasis or infiltrate. Small bilateral pleural effusions are identified. There has been some improvement in aeration of the right lung base.  IMPRESSION: 1. Postoperative lines and tubes. 2. Slightly improved aeration of the right lung base. 3. Persistent left lung base atelectasis and effusion.   Electronically Signed   By: Norva Pavlov M.D.   On: 12/19/2014 08:04   Dg Chest Port 1 View  2014/12/21   CLINICAL DATA:  Hypoxia/respiratory failure  EXAM: PORTABLE CHEST - 1 VIEW  COMPARISON:  December 17, 2014  FINDINGS: Endotracheal tube tip is 2.2 cm above the carina. Nasogastric tube tip and side port are in the stomach. Central catheter tip is in the superior vena cava. No pneumothorax. There is left lower lobe consolidation with small left  effusion. There is a small right effusion with right base atelectasis. Heart is mildly enlarged. Patient is status post coronary artery bypass grafting. No  adenopathy.  IMPRESSION: Tube and catheter positions as described without pneumothorax. Areas of consolidation with effusions. Heart prominent but stable.   Electronically Signed   By: Bretta Bang III M.D.   On: 12/11/2014 14:48   Dg Chest Port 1 View  12/17/2014   CLINICAL DATA:  Respiratory failure.  EXAM: PORTABLE CHEST - 1 VIEW  COMPARISON:  December 16, 2014.  FINDINGS: Stable cardiomediastinal silhouette. Endotracheal and nasogastric tubes are unchanged in position. Left internal jugular catheter is also noted with distal tip overlying expected position of the SVC. Status post coronary artery bypass graft. Bibasilar opacities are noted most consistent with subsegmental atelectasis. No pneumothorax is noted. No significant pleural effusion is noted.  IMPRESSION: Stable support apparatus. Stable bibasilar subsegmental atelectasis.   Electronically Signed   By: Lupita Raider, M.D.   On: 12/17/2014 07:53   Dg Chest Port 1 View  12/19/2014   CLINICAL DATA:  Status post central line placement  EXAM: PORTABLE CHEST - 1 VIEW  COMPARISON:  Chest film from earlier in the same day  FINDINGS: Postsurgical changes are again seen. An endotracheal tube is now noted 5.2 cm above the carina. A nasogastric catheter is seen extending into the stomach. A left jugular central line is noted with the tip in the mid superior vena cava. No pneumothorax is seen. Bibasilar atelectatic changes are again noted.  IMPRESSION: Tubes and lines as described above. No pneumothorax is noted. Stable bibasilar atelectatic changes are seen.   Electronically Signed   By: Alcide Clever M.D.   On: 12/27/2014 19:11   Dg Chest Port 1 View  12/25/2014   CLINICAL DATA:  Abdominal aortic aneurysm. Shortness of breath. Coronary artery disease. Pre-op respiratory exam  EXAM: PORTABLE CHEST - 1 VIEW  COMPARISON:  None.  FINDINGS: Low lung volumes are seen. Streaky opacity lung bases is likely due to atelectasis, although pneumonia cannot  definitely be excluded. No evidence of pleural effusion. Heart size is within normal limits allowing for low lung volumes. Previous CABG noted.  IMPRESSION: Low lung volumes with probable bibasilar atelectasis, although pneumonia cannot definitely be excluded.   Electronically Signed   By: Myles Rosenthal M.D.   On: 12/23/2014 13:16        Subjective: Patient tachypneic, purse lip breathing, confused  Objective: Filed Vitals:   12/29/14 0332 12/29/14 0616 12/29/14 1023 12/29/14 1050  BP: 118/48 106/51 113/48 117/46  Pulse: 98 94 93 96  Temp: 98.2 F (36.8 C) 97.6 F (36.4 C) 98.6 F (37 C)   TempSrc: Oral Oral Oral   Resp: 21 20  27   Height:      Weight:      SpO2: 98% 100% 97% 99%    Intake/Output Summary (Last 24 hours) at 12/29/14 1207 Last data filed at 12/29/14 1000  Gross per 24 hour  Intake 5164.83 ml  Output   5600 ml  Net -435.17 ml   Weight change: 1.342 kg (2 lb 15.3 oz) Exam:   General:  Pt is alert, follows commands appropriately, not in acute distress  HEENT: No icterus, No thrush,  [place]/AT  Cardiovascular: RRR, S1/S2, no rubs, no gallops  Respiratory: CTA bilaterally, no wheezing, no crackles, no rhonchi  Abdomen: Soft/+BS, abdominal wall with clear fluid drainage. non distended, no guarding  Extremities: No edema, No lymphangitis, No petechiae, No rashes,  no synovitis  Data Reviewed: Basic Metabolic Panel:  Recent Labs Lab 12/25/14 0500 12/26/14 1100 12/27/14 0437 12/28/14 0425 12/29/14 0601  NA 130* 134* 130* 128* 124*  K 4.8 4.5 4.1 4.2 4.0  CL 106 109 108 106 105  CO2 20 20 18* 17* 13*  GLUCOSE 135* 127* 125* 145* 148*  BUN 68* 78* 78* 73* 72*  CREATININE 1.72* 1.59* 1.68* 1.51* 1.58*  CALCIUM 7.1* 7.5* 7.2* 7.0* 7.0*  MG 3.0* 2.9* 2.7* 2.5 2.2  PHOS 6.0* 5.0* 4.4 3.9 4.7*   Liver Function Tests:  Recent Labs Lab 12/31/2014 0340 12/25/14 0500 12/27/14 0437 12/29/14 1000  AST 41* 62* 71* 62*  ALT 16 23 30 29   ALKPHOS 89 90 112  121*  BILITOT 3.2* 3.1* 2.8* 2.6*  PROT 4.5* 4.2* 4.7* 4.4*  ALBUMIN 2.1* 2.3* 1.9* 1.7*   No results for input(s): LIPASE, AMYLASE in the last 168 hours.  Recent Labs Lab 12/24/14 1411  AMMONIA 27   CBC:  Recent Labs Lab 12/25/14 1700 12/26/14 1240 12/27/14 0437 12/28/14 0425 12/29/14 0601  WBC 23.8* 20.1* 20.5* 17.6* 25.0*  NEUTROABS  --   --  16.2*  --   --   HGB 8.8* 8.5* 8.7* 8.5* 9.6*  HCT 25.1* 24.0* 24.9* 24.2* 28.1*  MCV 98.0 96.4 96.1 95.3 98.3  PLT 126* 131* 145* 145* 181   Cardiac Enzymes:  Recent Labs Lab 12/29/14 1000  TROPONINI <0.03   BNP: Invalid input(s): POCBNP CBG:  Recent Labs Lab 12/28/14 0723 12/28/14 1230 12/28/14 1550 12/28/14 2108 12/29/14 0805  GLUCAP 97 125* 124* 157* 157*    Recent Results (from the past 240 hour(s))  Clostridium Difficile by PCR     Status: None   Collection Time: 12/21/14  3:32 AM  Result Value Ref Range Status   C difficile by pcr NEGATIVE NEGATIVE Final  Culture, blood (routine x 2)     Status: None (Preliminary result)   Collection Time: 12/25/14 11:00 AM  Result Value Ref Range Status   Specimen Description BLOOD LEFT HAND  Final   Special Requests BOTTLES DRAWN AEROBIC ONLY 7CC  Final   Culture   Final           BLOOD CULTURE RECEIVED NO GROWTH TO DATE CULTURE WILL BE HELD FOR 5 DAYS BEFORE ISSUING A FINAL NEGATIVE REPORT Performed at [facility]    Report Status PENDING  Incomplete  Urine culture     Status: None   Collection Time: 12/25/14 11:00 AM  Result Value Ref Range Status   Specimen Description URINE, RANDOM  Final   Special Requests NONE  Final   Colony Count   Final    1,000 COLONIES/ML Performed at [facility]    Culture   Final    INSIGNIFICANT GROWTH Performed at [facility]    Report Status 12/26/2014 FINAL  Final  Culture, blood (routine x 2)     Status: None (Preliminary result)   Collection Time: 12/25/14 11:10 AM  Result Value Ref Range  Status   Specimen Description BLOOD LEFT HAND  Final   Special Requests BOTTLES DRAWN AEROBIC ONLY 5CC  Final   Culture   Final           BLOOD CULTURE RECEIVED NO GROWTH TO DATE CULTURE WILL BE HELD FOR 5 DAYS BEFORE ISSUING A FINAL NEGATIVE REPORT Performed at [facility]    Report Status PENDING  Incomplete     Scheduled Meds: .  chlorhexidine  15 mL Mouth Rinse BID  . feeding supplement (ENSURE COMPLETE)  237 mL Oral BID BM  . heparin subcutaneous  5,000 Units Subcutaneous 3 times per day  . insulin aspart  0-9 Units Subcutaneous TID WC  . iohexol  25 mL Oral Q1 Hr x 2  . lactulose  20 g Oral BID  . metoprolol  2.5 mg Intravenous Q6H  . piperacillin-tazobactam (ZOSYN)  IV  3.375 g Intravenous Q8H   Continuous Infusions: . sodium chloride 30 mL/hr at 12/29/14 1058  . TPN (CLINIMIX) Adult without lytes 110 mL/hr at 12/28/14 1804   And  . fat emulsion 240 mL (12/28/14 1805)  . TPN (CLINIMIX) Adult without lytes     And  . fat emulsion    . sodium chloride 0.9 % 1,000 mL infusion 125 mL/hr at 12/24/14 1300     [facility],   [facility] Pager [phone]  If 7PM-7AM, please contact night-coverage [email or web address] Password [email or web address] 12/29/2014, 12:07 PM   LOS: 13 days

## 2014-12-29 NOTE — Progress Notes (Signed)
Called twicen on the only contact number [phone],no one answering the call,left a voicemail messages and contact number to call back us [facility] at [facility].

## 2014-12-29 NOTE — Progress Notes (Signed)
NUTRITION FOLLOW UP   Intervention:   TPN per Pharmacy.  Discontinue Calorie Count.  RD to continue to monitor.  Nutrition Dx:   Inadequate oral intake related to inability to eat as evidenced by recent bowel surgery and clear liquid diet; ongoing  Goal:   Intake to meet >90% of estimated nutrition needs.  Monitor:   TPN tolerance, diet advancement, labs, weight trend.  Assessment:   64yo male with hx HTN, CAD s/p CABG, cirrhosis, reported recent hx pancreatitis. Presented with 2 day hx worsening abd pain, nausea and vomiting.  SP ex lap with small bowel resection and application of wound VAC on 2/11.  Pt transferred to 2H due to labored breathing. Per MD note, pt with increased white count, septic, with significant metabolic acidosis, and ongoing ischemic bowel/peritonitis. NGT in place. Pt has been made NPO. Calorie count will be discontinued.   2/23 Calorie count results: Dinner: 230 kcal and 5 grams of protein. No additional meal tickets were available, however intake remained minimal.   Current plans for full dosing of TPN. Patient is currently receiving TPN with Clinimix 5/15 @ 110 ml/hr and lipids @ 10 ml/hr. Provides 2354 kcal, and 132 grams protein per day. Meets 100% minimum estimated energy needs and 100% minimum estimated protein needs.  Will continue to monitor.  Labs: Low sodium, chloride, calcium, GFR. High BUN, creatinine, AST, alkaline phosphatase, total bilirubin.  Height: Ht Readings from Last 1 Encounters:  12/28/14 6\' 1"  (1.854 m)    Weight Status:   Wt Readings from Last 1 Encounters:  12/28/14 234 lb 14.1 oz (106.542 kg)    12/21/14 252 lb 10.4 oz (114.6 kg)       12/17/14 235 lb 14.3 oz (107 kg)       Re-estimated needs:  Kcal: 2200-2400 Protein: 120-135 grams Fluid: >2.2 L  Skin: abdominal incision, deep tissue injury on nose due to NGT  Diet Order: TPN (CLINIMIX) Adult without lytes TPN (CLINIMIX) Adult without lytes Diet  NPO time specified   Intake/Output Summary (Last 24 hours) at 12/29/14 1428 Last data filed at 12/29/14 1000  Gross per 24 hour  Intake 4664.83 ml  Output   5600 ml  Net -935.17 ml    Last BM: 2/23   Labs:   Recent Labs Lab 12/27/14 0437 12/28/14 0425 12/29/14 0601  NA 130* 128* 124*  K 4.1 4.2 4.0  CL 108 106 105  CO2 18* 17* 13*  BUN 78* 73* 72*  CREATININE 1.68* 1.51* 1.58*  CALCIUM 7.2* 7.0* 7.0*  MG 2.7* 2.5 2.2  PHOS 4.4 3.9 4.7*  GLUCOSE 125* 145* 148*    CBG (last 3)   Recent Labs  12/28/14 2108 12/29/14 0805 12/29/14 1242  GLUCAP 157* 157* 152*    Scheduled Meds: . chlorhexidine  15 mL Mouth Rinse BID  . feeding supplement (ENSURE COMPLETE)  237 mL Oral BID BM  . heparin subcutaneous  5,000 Units Subcutaneous 3 times per day  . insulin aspart  0-9 Units Subcutaneous TID WC  . lactulose  20 g Oral BID  . metoprolol  2.5 mg Intravenous Q6H  . piperacillin-tazobactam (ZOSYN)  IV  3.375 g Intravenous Q8H    Continuous Infusions: . TPN (CLINIMIX) Adult without lytes 110 mL/hr at 12/28/14 1804   And  . fat emulsion 240 mL (12/28/14 1805)  . TPN (CLINIMIX) Adult without lytes     And  . fat emulsion    .  sodium bicarbonate infusion 1/4 NS 1000  mL    . sodium chloride 0.9 % 1,000 mL infusion 125 mL/hr at 12/24/14 1300    Marijean Niemann, [place], [place], LDN Pager # [phone] After hours/ weekend pager # [phone]

## 2014-12-29 NOTE — Progress Notes (Signed)
Patient having frequent loose stools, 3+ even though probably cause is related to lactulose, will have to initiate contact precautions for rule out c-diff.  Rise PaganiniURRY, Kyle West R, RN

## 2014-12-29 NOTE — Progress Notes (Signed)
PT Cancellation Note  Patient Details Name: Kyle West MRN: [id number] DOB: 08-Jan-1951   Cancelled Treatment:    Reason Eval/Treat Not Completed: Medical issues which prohibited therapy. Per RN pt about to transfer to a step-down unit. Will continue to follow and resume therapy sessions when appropriate.    Conni SlipperKirkman, Ileane Sando 12/29/2014, 10:14 AM   Conni SlipperLaura Adya Wirz, PT, DPT Acute Rehabilitation Services Pager: [phone]

## 2014-12-30 ENCOUNTER — Inpatient Hospital Stay (HOSPITAL_COMMUNITY): Payer: BLUE CROSS/BLUE SHIELD

## 2014-12-30 DIAGNOSIS — J9600 Acute respiratory failure, unspecified whether with hypoxia or hypercapnia: Secondary | ICD-10-CM

## 2014-12-30 DIAGNOSIS — N179 Acute kidney failure, unspecified: Secondary | ICD-10-CM

## 2014-12-30 DIAGNOSIS — R0602 Shortness of breath: Secondary | ICD-10-CM

## 2014-12-30 DIAGNOSIS — K659 Peritonitis, unspecified: Secondary | ICD-10-CM

## 2014-12-30 LAB — COMPREHENSIVE METABOLIC PANEL
ALT: 34 U/L (ref 0–53)
ALT: 39 U/L (ref 0–53)
ANION GAP: 9 (ref 5–15)
ANION GAP: 12 (ref 5–15)
AST: 72 U/L — ABNORMAL HIGH (ref 0–37)
AST: 90 U/L — ABNORMAL HIGH (ref 0–37)
Albumin: 1.4 g/dL — ABNORMAL LOW (ref 3.5–5.2)
Albumin: 1.3 g/dL — ABNORMAL LOW (ref 3.5–5.2)
Alkaline Phosphatase: 103 U/L (ref 39–117)
Alkaline Phosphatase: 109 U/L (ref 39–117)
BUN: 93 mg/dL — AB (ref 6–23)
BUN: 95 mg/dL — ABNORMAL HIGH (ref 6–23)
CALCIUM: 7.1 mg/dL — AB (ref 8.4–10.5)
CALCIUM: 6.6 mg/dL — AB (ref 8.4–10.5)
CO2: 14 mmol/L — AB (ref 19–32)
CO2: 11 mmol/L — ABNORMAL LOW (ref 19–32)
Chloride: 99 mmol/L (ref 96–112)
Chloride: 97 mmol/L (ref 96–112)
Creatinine, Ser: 2.28 mg/dL — ABNORMAL HIGH (ref 0.50–1.35)
Creatinine, Ser: 2.28 mg/dL — ABNORMAL HIGH (ref 0.50–1.35)
GFR calc non Af Amer: 29 mL/min — ABNORMAL LOW (ref >90)
GFR, EST AFRICAN AMERICAN: 33 mL/min — AB (ref >90)
GFR, EST AFRICAN AMERICAN: 33 mL/min — AB (ref >90)
GFR, EST NON AFRICAN AMERICAN: 29 mL/min — AB (ref >90)
GLUCOSE: 179 mg/dL — AB (ref 70–99)
Glucose, Bld: 147 mg/dL — ABNORMAL HIGH (ref 70–99)
POTASSIUM: 3.7 mmol/L (ref 3.5–5.1)
POTASSIUM: 3.5 mmol/L (ref 3.5–5.1)
SODIUM: 120 mmol/L — AB (ref 135–145)
Sodium: 122 mmol/L — ABNORMAL LOW (ref 135–145)
TOTAL PROTEIN: 3.5 g/dL — AB (ref 6.0–8.3)
Total Bilirubin: 2.3 mg/dL — ABNORMAL HIGH (ref 0.3–1.2)
Total Bilirubin: 2 mg/dL — ABNORMAL HIGH (ref 0.3–1.2)
Total Protein: 3.8 g/dL — ABNORMAL LOW (ref 6.0–8.3)

## 2014-12-30 LAB — URINALYSIS, ROUTINE W REFLEX MICROSCOPIC
Bilirubin Urine: NEGATIVE
Bilirubin Urine: NEGATIVE
Glucose, UA: NEGATIVE mg/dL
Glucose, UA: NEGATIVE mg/dL
Hgb urine dipstick: NEGATIVE
KETONES UR: NEGATIVE mg/dL
Ketones, ur: NEGATIVE mg/dL
Leukocytes, UA: NEGATIVE
NITRITE: NEGATIVE
Nitrite: NEGATIVE
PH: 5 (ref 5.0–8.0)
PROTEIN: NEGATIVE mg/dL
Protein, ur: NEGATIVE mg/dL
SPECIFIC GRAVITY, URINE: 1.019 (ref 1.005–1.030)
Specific Gravity, Urine: 1.022 (ref 1.005–1.030)
UROBILINOGEN UA: 0.2 mg/dL (ref 0.0–1.0)
Urobilinogen, UA: 0.2 mg/dL (ref 0.0–1.0)
pH: 5 (ref 5.0–8.0)

## 2014-12-30 LAB — POCT I-STAT 3, ART BLOOD GAS (G3+)
Acid-base deficit: 12 mmol/L — ABNORMAL HIGH (ref 0.0–2.0)
BICARBONATE: 15 meq/L — AB (ref 20.0–24.0)
O2 SAT: 100 %
PH ART: 7.214 — AB (ref 7.350–7.450)
TCO2: 16 mmol/L (ref 0–100)
pCO2 arterial: 37.1 mmHg (ref 35.0–45.0)
pO2, Arterial: 354 mmHg — ABNORMAL HIGH (ref 80.0–100.0)

## 2014-12-30 LAB — TROPONIN I
TROPONIN I: 0.06 ng/mL — AB (ref <0.031)
Troponin I: <0.03 ng/mL (ref <0.031)
Troponin I: 0.05 ng/mL — ABNORMAL HIGH (ref <0.031)

## 2014-12-30 LAB — CBC
HCT: 23.4 % — ABNORMAL LOW (ref 39.0–52.0)
HEMATOCRIT: 23.3 % — AB (ref 39.0–52.0)
HEMOGLOBIN: 8.2 g/dL — AB (ref 13.0–17.0)
HEMOGLOBIN: 8.4 g/dL — AB (ref 13.0–17.0)
MCH: 32.9 pg (ref 26.0–34.0)
MCH: 33.7 pg (ref 26.0–34.0)
MCHC: 35 g/dL (ref 30.0–36.0)
MCHC: 36.1 g/dL — ABNORMAL HIGH (ref 30.0–36.0)
MCV: 94 fL (ref 78.0–100.0)
MCV: 93.6 fL (ref 78.0–100.0)
Platelets: 160 10*3/uL (ref 150–400)
Platelets: 220 10*3/uL (ref 150–400)
RBC: 2.49 MIL/uL — AB (ref 4.22–5.81)
RBC: 2.49 MIL/uL — AB (ref 4.22–5.81)
RDW: 16.2 % — ABNORMAL HIGH (ref 11.5–15.5)
RDW: 16.2 % — AB (ref 11.5–15.5)
WBC: 30.3 10*3/uL — ABNORMAL HIGH (ref 4.0–10.5)
WBC: 43.9 10*3/uL — AB (ref 4.0–10.5)

## 2014-12-30 LAB — CLOSTRIDIUM DIFFICILE BY PCR: Toxigenic C. Difficile by PCR: NEGATIVE

## 2014-12-30 LAB — BLOOD GAS, ARTERIAL
Acid-base deficit: 12.8 mmol/L — ABNORMAL HIGH (ref 0.0–2.0)
Bicarbonate: 12.1 mEq/L — ABNORMAL LOW (ref 20.0–24.0)
DRAWN BY: 419341
Delivery systems: POSITIVE
O2 CONTENT: 2 L/min
O2 Saturation: 95.8 %
PATIENT TEMPERATURE: 98.6
PCO2 ART: 24.1 mmHg — AB (ref 35.0–45.0)
PO2 ART: 88.3 mmHg (ref 80.0–100.0)
TCO2: 12.8 mmol/L (ref 0–100)
pH, Arterial: 7.322 — ABNORMAL LOW (ref 7.350–7.450)

## 2014-12-30 LAB — URINE MICROSCOPIC-ADD ON

## 2014-12-30 LAB — BASIC METABOLIC PANEL
ANION GAP: 10 (ref 5–15)
BUN: 95 mg/dL — AB (ref 6–23)
CO2: 13 mmol/L — ABNORMAL LOW (ref 19–32)
CREATININE: 2.28 mg/dL — AB (ref 0.50–1.35)
Calcium: 6.6 mg/dL — ABNORMAL LOW (ref 8.4–10.5)
Chloride: 97 mmol/L (ref 96–112)
GFR calc Af Amer: 33 mL/min — ABNORMAL LOW (ref >90)
GFR, EST NON AFRICAN AMERICAN: 29 mL/min — AB (ref >90)
Glucose, Bld: 152 mg/dL — ABNORMAL HIGH (ref 70–99)
Potassium: 3.6 mmol/L (ref 3.5–5.1)
Sodium: 120 mmol/L — ABNORMAL LOW (ref 135–145)

## 2014-12-30 LAB — GLUCOSE, CAPILLARY
GLUCOSE-CAPILLARY: 158 mg/dL — AB (ref 70–99)
GLUCOSE-CAPILLARY: 169 mg/dL — AB (ref 70–99)
Glucose-Capillary: 179 mg/dL — ABNORMAL HIGH (ref 70–99)
Glucose-Capillary: 148 mg/dL — ABNORMAL HIGH (ref 70–99)

## 2014-12-30 LAB — PHOSPHORUS
Phosphorus: 5.6 mg/dL — ABNORMAL HIGH (ref 2.3–4.6)
Phosphorus: 5.8 mg/dL — ABNORMAL HIGH (ref 2.3–4.6)

## 2014-12-30 LAB — CBC WITH DIFFERENTIAL/PLATELET
BASOS PCT: 0 % (ref 0–1)
Basophils Absolute: 0 10*3/uL (ref 0.0–0.1)
Eosinophils Absolute: 0.3 10*3/uL (ref 0.0–0.7)
Eosinophils Relative: 1 % (ref 0–5)
HCT: 24.7 % — ABNORMAL LOW (ref 39.0–52.0)
HEMOGLOBIN: 8.8 g/dL — AB (ref 13.0–17.0)
LYMPHS PCT: 5 % — AB (ref 12–46)
Lymphs Abs: 1.6 10*3/uL (ref 0.7–4.0)
MCH: 34.2 pg — ABNORMAL HIGH (ref 26.0–34.0)
MCHC: 35.6 g/dL (ref 30.0–36.0)
MCV: 96.1 fL (ref 78.0–100.0)
Monocytes Absolute: 3.5 10*3/uL — ABNORMAL HIGH (ref 0.1–1.0)
Monocytes Relative: 11 % (ref 3–12)
Neutro Abs: 26.1 10*3/uL — ABNORMAL HIGH (ref 1.7–7.7)
Neutrophils Relative %: 83 % — ABNORMAL HIGH (ref 43–77)
Platelets: 154 10*3/uL (ref 150–400)
RBC: 2.57 MIL/uL — ABNORMAL LOW (ref 4.22–5.81)
RDW: 16.4 % — AB (ref 11.5–15.5)
WBC: 31.5 10*3/uL — ABNORMAL HIGH (ref 4.0–10.5)

## 2014-12-30 LAB — SODIUM, URINE, RANDOM

## 2014-12-30 LAB — CREATININE, URINE, RANDOM: CREATININE, URINE: 99.86 mg/dL

## 2014-12-30 LAB — AMMONIA: AMMONIA: 43 umol/L — AB (ref 11–32)

## 2014-12-30 LAB — LACTIC ACID, PLASMA: LACTIC ACID, VENOUS: 1.5 mmol/L (ref 0.5–2.0)

## 2014-12-30 LAB — MAGNESIUM: Magnesium: 1.9 mg/dL (ref 1.5–2.5)

## 2014-12-30 MED ORDER — NOREPINEPHRINE BITARTRATE 1 MG/ML IV SOLN
2.0000 ug/min | INTRAVENOUS | Status: DC
  Administered 2014-12-30: 10 ug/min via INTRAVENOUS
  Filled 2014-12-30: qty 4

## 2014-12-30 MED ORDER — DEXTROSE 5 % IV SOLN
30.0000 ug/min | INTRAVENOUS | Status: DC
  Administered 2014-12-30 (×2): 200 ug/min via INTRAVENOUS
  Administered 2014-12-30: 150 ug/min via INTRAVENOUS
  Administered 2014-12-30: 30 ug/min via INTRAVENOUS
  Filled 2014-12-30 (×4): qty 1

## 2014-12-30 MED ORDER — SODIUM CHLORIDE 0.9 % IV BOLUS (SEPSIS)
500.0000 mL | Freq: Once | INTRAVENOUS | Status: AC
  Administered 2014-12-30: 500 mL via INTRAVENOUS

## 2014-12-30 MED ORDER — MIDAZOLAM HCL 2 MG/2ML IJ SOLN
1.0000 mg | INTRAMUSCULAR | Status: DC | PRN
  Administered 2014-12-30 – 2015-01-01 (×5): 2 mg via INTRAVENOUS
  Filled 2014-12-30 (×6): qty 2

## 2014-12-30 MED ORDER — STERILE WATER FOR INJECTION IV SOLN
INTRAVENOUS | Status: AC
  Administered 2014-12-30: 18:00:00 via INTRAVENOUS
  Filled 2014-12-30: qty 850

## 2014-12-30 MED ORDER — SODIUM CHLORIDE 0.9 % IV SOLN
25.0000 ug/h | INTRAVENOUS | Status: DC
  Administered 2014-12-30: 25 ug/h via INTRAVENOUS
  Administered 2015-01-01: 75 ug/h via INTRAVENOUS
  Administered 2015-01-01: 150 ug/h via INTRAVENOUS
  Administered 2015-01-02 – 2015-01-03 (×2): 200 ug/h via INTRAVENOUS
  Filled 2014-12-30 (×5): qty 50

## 2014-12-30 MED ORDER — ROCURONIUM BROMIDE 50 MG/5ML IV SOLN
INTRAVENOUS | Status: AC
  Filled 2014-12-30: qty 2

## 2014-12-30 MED ORDER — FAT EMULSION 20 % IV EMUL
240.0000 mL | INTRAVENOUS | Status: AC
  Administered 2014-12-30: 240 mL via INTRAVENOUS
  Filled 2014-12-30: qty 250

## 2014-12-30 MED ORDER — DEXTROSE 5 % IV SOLN
2.0000 ug/min | INTRAVENOUS | Status: DC
  Administered 2014-12-30: 40 ug/min via INTRAVENOUS
  Administered 2014-12-31: 50 ug/min via INTRAVENOUS
  Administered 2014-12-31: 30 ug/min via INTRAVENOUS
  Administered 2014-12-31 (×2): 50 ug/min via INTRAVENOUS
  Administered 2015-01-01: 45 ug/min via INTRAVENOUS
  Administered 2015-01-01: 50 ug/min via INTRAVENOUS
  Administered 2015-01-02: 43 ug/min via INTRAVENOUS
  Administered 2015-01-02: 28 ug/min via INTRAVENOUS
  Administered 2015-01-03: 20 ug/min via INTRAVENOUS
  Administered 2015-01-06: 16 ug/min via INTRAVENOUS
  Administered 2015-01-07: 15 ug/min via INTRAVENOUS
  Administered 2015-01-07: 13 ug/min via INTRAVENOUS
  Administered 2015-01-08: 50 ug/min via INTRAVENOUS
  Filled 2014-12-30 (×18): qty 16

## 2014-12-30 MED ORDER — ETOMIDATE 2 MG/ML IV SOLN
INTRAVENOUS | Status: AC
  Administered 2014-12-30: 30 mg
  Filled 2014-12-30: qty 20

## 2014-12-30 MED ORDER — LORAZEPAM 2 MG/ML IJ SOLN
1.0000 mg | Freq: Once | INTRAMUSCULAR | Status: AC
  Administered 2014-12-30: 1 mg via INTRAVENOUS
  Filled 2014-12-30: qty 1

## 2014-12-30 MED ORDER — MAGNESIUM SULFATE IN D5W 10-5 MG/ML-% IV SOLN
1.0000 g | Freq: Once | INTRAVENOUS | Status: AC
  Administered 2014-12-30: 1 g via INTRAVENOUS
  Filled 2014-12-30: qty 100

## 2014-12-30 MED ORDER — SODIUM BICARBONATE 8.4 % IV SOLN
INTRAVENOUS | Status: AC
  Administered 2014-12-30: 50 meq
  Filled 2014-12-30: qty 50

## 2014-12-30 MED ORDER — MIDAZOLAM HCL 2 MG/2ML IJ SOLN
INTRAMUSCULAR | Status: AC
  Administered 2014-12-30: 2 mg
  Filled 2014-12-30: qty 2

## 2014-12-30 MED ORDER — PANTOPRAZOLE SODIUM 40 MG IV SOLR
40.0000 mg | INTRAVENOUS | Status: DC
  Administered 2014-12-30 – 2015-01-01 (×3): 40 mg via INTRAVENOUS
  Filled 2014-12-30 (×3): qty 40

## 2014-12-30 MED ORDER — FENTANYL BOLUS VIA INFUSION
50.0000 ug | INTRAVENOUS | Status: DC | PRN
  Administered 2015-01-03: 50 ug via INTRAVENOUS
  Filled 2014-12-30: qty 50

## 2014-12-30 MED ORDER — STERILE WATER FOR INJECTION IV SOLN
INTRAVENOUS | Status: DC
  Administered 2014-12-30: 22:00:00 via INTRAVENOUS
  Filled 2014-12-30 (×6): qty 850

## 2014-12-30 MED ORDER — MIDAZOLAM HCL 2 MG/2ML IJ SOLN
INTRAMUSCULAR | Status: AC
  Filled 2014-12-30: qty 4

## 2014-12-30 MED ORDER — SODIUM CHLORIDE 0.9 % IV BOLUS (SEPSIS)
1000.0000 mL | Freq: Once | INTRAVENOUS | Status: AC
  Administered 2014-12-30: 1000 mL via INTRAVENOUS

## 2014-12-30 MED ORDER — LIDOCAINE HCL (CARDIAC) 20 MG/ML IV SOLN
INTRAVENOUS | Status: AC
  Filled 2014-12-30: qty 5

## 2014-12-30 MED ORDER — PHENYLEPHRINE HCL 10 MG/ML IJ SOLN
30.0000 ug/min | INTRAVENOUS | Status: DC
  Administered 2014-12-30: 40 ug/min via INTRAVENOUS
  Administered 2014-12-31 (×2): 200 ug/min via INTRAVENOUS
  Administered 2014-12-31: 160 ug/min via INTRAVENOUS
  Administered 2015-01-01: 100 ug/min via INTRAVENOUS
  Filled 2014-12-30 (×6): qty 4

## 2014-12-30 MED ORDER — FENTANYL CITRATE 0.05 MG/ML IJ SOLN
50.0000 ug | INTRAMUSCULAR | Status: DC | PRN
  Filled 2014-12-30: qty 2

## 2014-12-30 MED ORDER — SODIUM BICARBONATE 8.4 % IV SOLN
50.0000 meq | Freq: Once | INTRAVENOUS | Status: AC
Start: 2014-12-30 — End: 2014-12-30
  Filled 2014-12-30: qty 50

## 2014-12-30 MED ORDER — POTASSIUM CHLORIDE 10 MEQ/50ML IV SOLN
10.0000 meq | INTRAVENOUS | Status: AC
  Administered 2014-12-30 (×2): 10 meq via INTRAVENOUS
  Filled 2014-12-30: qty 50

## 2014-12-30 MED ORDER — TRACE MINERALS CR-CU-F-FE-I-MN-MO-SE-ZN IV SOLN
INTRAVENOUS | Status: AC
  Administered 2014-12-30: 18:00:00 via INTRAVENOUS
  Filled 2014-12-30: qty 2640

## 2014-12-30 MED ORDER — FENTANYL CITRATE 0.05 MG/ML IJ SOLN
50.0000 ug | Freq: Once | INTRAMUSCULAR | Status: AC
  Administered 2014-12-30: 50 ug via INTRAVENOUS

## 2014-12-30 MED ORDER — VANCOMYCIN HCL 10 G IV SOLR
1250.0000 mg | INTRAVENOUS | Status: DC
  Administered 2014-12-30 – 2014-12-31 (×2): 1250 mg via INTRAVENOUS
  Filled 2014-12-30 (×3): qty 1250

## 2014-12-30 MED ORDER — SUCCINYLCHOLINE CHLORIDE 20 MG/ML IJ SOLN
INTRAMUSCULAR | Status: AC
  Filled 2014-12-30: qty 1

## 2014-12-30 MED ORDER — SODIUM CHLORIDE 0.9 % IV SOLN: INTRAVENOUS | Status: DC | PRN

## 2014-12-30 NOTE — Procedures (Signed)
Central Venous Catheter Insertion Procedure Note Romana Juniperathaniel Marques [id number] 02-09-1951  Procedure: Insertion of Central Venous Catheter Indications: Drug and/or fluid administration  Procedure Details Consent: Risks of procedure as well as the alternatives and risks of each were explained to the (patient/caregiver).  Consent for procedure obtained. Time Out: Verified patient identification, verified procedure, site/side was marked, verified correct patient position, special equipment/implants available, medications/allergies/relevent history reviewed, required imaging and test results available.  Performed  Maximum sterile technique was used including antiseptics, cap, gloves, gown, hand hygiene, mask and sheet. Skin prep: Chlorhexidine; local anesthetic administered A antimicrobial bonded/coated triple lumen catheter was placed in the right femoral vein due to multiple attempts, no other available access using the Seldinger technique.  Evaluation Blood flow good Complications: No apparent complications Patient did tolerate procedure well. Chest X-ray ordered to verify placement.    Performed under direct MD supervision.  Performed using ultrasound guidance.  Wire visualized in vessel under ultrasound.    Placed by Joneen RoachPaul Hoffman ANP 12/30/2014 3:52 PM  Levy Pupaobert Byrum, MD, PhD 12/30/2014, 4:20 PM [facility] [phone] or if no answer [phone]

## 2014-12-30 NOTE — Procedures (Signed)
Arterial Catheter Insertion Procedure Note Romana Juniperathaniel Myszka [id number] [birth year]  Procedure: Insertion of Arterial Catheter  Indications: Blood pressure monitoring and Frequent blood sampling  Procedure Details Consent: Risks of procedure as well as the alternatives and risks of each were explained to the (patient/caregiver).  Consent for procedure obtained. Time Out: Verified patient identification, verified procedure, site/side was marked, verified correct patient position, special equipment/implants available, medications/allergies/relevent history reviewed, required imaging and test results available.  Performed  Maximum sterile technique was used including antiseptics, cap, gloves, gown, hand hygiene, mask and sheet. Skin prep: Chlorhexidine; local anesthetic administered 20 gauge catheter was inserted into left radial artery using the Seldinger technique.  Evaluation Blood flow good; BP tracing good. Complications: No apparent complications.   Devra DoppGibson, Lysandra Loughmiller D 12/30/2014

## 2014-12-30 NOTE — Procedures (Signed)
Intubation Procedure Note Romana Juniperathaniel Wynn [id number] Jun 16, 1951  Procedure: Intubation Indications: Respiratory insufficiency  Procedure Details Consent: Risks of procedure as well as the alternatives and risks of each were explained to the (patient/caregiver).  Consent for procedure obtained. Time Out: Verified patient identification, verified procedure, site/side was marked, verified correct patient position, special equipment/implants available, medications/allergies/relevent history reviewed, required imaging and test results available.  Performed  Maximum sterile technique was used including antiseptics, cap, gloves, hand hygiene and mask.  Glidescope     Evaluation Hemodynamic Status: Transient hypotension treated with pressors; O2 sats: stable throughout Patient's Current Condition: stable Complications: No apparent complications Patient did tolerate procedure well. Chest X-ray ordered to verify placement.  CXR: pending.  Performed under direct MD supervision.  Performed using ultrasound guidance.  Wire visualized in vessel under ultrasound.   Dirk DressKaty Whiteheart, NP 12/30/2014  1:30 PM Pager: [phone] or [phone]  Levy Pupaobert Byrum, MD, PhD 12/30/2014, 4:20 PM [facility] [phone] or if no answer [phone]

## 2014-12-30 NOTE — Progress Notes (Signed)
PT Cancellation Note  Patient Details Name: Kyle West MRN: [id number] DOB: 17-Sep-1951   Cancelled Treatment:    Reason Eval/Treat Not Completed: Medical issues which prohibited therapy (lethargic on bipap) per nsg, Hold therapies.   Fabio AsaWerner, Jamesia Linnen J 12/30/2014, 8:34 AM Charlotte Crumbevon Rafaella Kole, PT DPT  [phone]

## 2014-12-30 NOTE — Progress Notes (Signed)
PARENTERAL NUTRITION CONSULT NOTE - FOLLOW UP  Pharmacy Consult for TPN Indication: Prolonged ileus  Allergies  Allergen Reactions  . Oxycodone Hives and Other (See Comments)    Hallucinations    Patient Measurements: Height:  (185.4 cm) Weight: 231 lb 11.3 oz (105.1 kg) IBW/kg (Calculated) : 79.9  Vital Signs: Temp: 98.9 F (37.2 C) (02/25 0506) Temp Source: Oral (02/25 0506) BP: 81/30 mmHg (02/25 0630) Pulse Rate: 87 (02/25 0630) Intake/Output from previous day: 02/24 0701 - 02/25 0700 In: 4553.8 [I.V.:1143.8; IV Piggyback:650; TPN:2760] Out: 3800 [Urine:500; Drains:1500; Stool:1800] Intake/Output from this shift:    Labs:  Recent Labs  12/28/14 0425 12/29/14 0601  WBC 17.6* 25.0*  HGB 8.5* 9.6*  HCT 24.2* 28.1*  PLT 145* 181     Recent Labs  12/27/14 1500 12/28/14 0425 12/29/14 0601 12/29/14 1000 12/30/14 0430  NA  --  128* 124*  --  122*  K  --  4.2 4.0  --  3.7  CL  --  106 105  --  99  CO2  --  17* 13*  --  14*  GLUCOSE  --  145* 148*  --  179*  BUN  --  73* 72*  --  93*  CREATININE  --  1.51* 1.58*  --  2.28*  LABCREA 67.13  --   --   --   --   CALCIUM  --  7.0* 7.0*  --  7.1*  MG  --  2.5 2.2  --  1.9  PHOS  --  3.9 4.7*  --  5.6*  PROT  --   --   --  4.4* 3.8*  ALBUMIN  --   --   --  1.7* 1.4*  AST  --   --   --  62* 72*  ALT  --   --   --  29 34  ALKPHOS  --   --   --  121* 103  BILITOT  --   --   --  2.6* 2.3*  BILIDIR  --   --   --  1.1*  --   IBILI  --   --   --  1.5*  --    Estimated Creatinine Clearance: 41.7 mL/min (by C-G formula based on Cr of 2.28).    Recent Labs  12/29/14 1803 12/29/14 2323 12/30/14 0817  GLUCAP 151* 179* 158*   Insulin Requirements in the past 24 hours:  7 units sensitive SSI, 12 units insulin in TPN  Nutritional Goals: (per RD note on 2/22) 2200-2400 kcal, 120-135g of protein per day  Current Nutrition:  -Clinimix 5/15 at 148ml/hr and Lipids at 31ml/hr, providing 132g protein and 2354  kCal (meets 100% of goals) -Ensure complete BID- each provides 350kCal and 13g protein- ordered, has not had any; changed back to NPO yesterday  Admit:  [name] transferred from outside hospital for concern of ischemic bowel. He underwent surgery on 2/11 - exploratory laparotomy, small bowel resection and application of a wound vac. TPN was started in anticipation of prolonged bowel rest/ileus.   GI:  S/P ex. Lap with SBR on 2/11. Also history of pancreatitis and cirrhosis child pugh B. ZOX:WRUEAVWUJ, PPI, Rifaximin, spironolactione, Ursodiol. S/P side-to-side small bowel anastomosis and closure of abdomen on 2/13.POD #7 s/p ex lap with closure. Suspected ascites from wound- wound in tact with Eakin pouch to contain drainage- serous, blood tinged. +BS. Baseline prealbumin low at 6.4, this week it still remains low at 3.5.  Patient decompensated yesterday and calorie count and diet were stopped.  Endo: No history of diabetes. CBGs 152-179- has 12 units (receiving ~10.5 units) regular insulin in TPN bag along with SSI ordered  Lytes: Na dropped to 122, K 3.7, Mg 1.9, Phos incr 5.6, CorCa 9.2 (Ca x phos product = 51.5). Since patient has been without lytes in TPN, questioning if lactulose is the reason for Na drop. Remains acidotic this morning. Was on NaBicarb gtt yesterday, but order expired and has not yet been resumed.  Renal: AKI s/p surgery which was resolving. Now SCr bumped again-1.58>2.28, UOP dropped to 0.2 ml/kg/hr in last 24h. Currently hydrating with NS @ 125 mL/hr, no signs of fluid overload per MD, though noted patient is +2.5L if I/O's are correct.  Pulm:CPAP  Cards: History of CAD. BP soft, HR wnl. Metoprolol IV scheduled. Did have episode of PAF after surgery which has since resolved.  Hepatobil: history of cirrhosis/NASH.alkp phos nml, AST stable at 72, ALT remains wnl, Tbili down at 2.3. INR 1.67 (2/13). TG 56.  Ammonia 27 (2/19; improved on lactulose- now  stopped)  Neuro: Now with post anesthesia hepatic encephalopathy. PRN Haldol  ID: Zosyn for intra-abdominal coverage (2/11>>), re-adding vanc back for sepsis of unknown source (2/20>>2/23; 2/25>>). Afebrile, WBC back up to 25. PCT 5.7, LA 1.3. CDiff(-) 2/16, but now with increased loose stools and checking again.  Best Practices: SCDs, mouthcare, Hep SQ, PPI   TPN Access: Triple Lumen IJ (placed 2/11) TPN day#:13; (12/17/14 >> )  Plan:  -give KCl 10mEq runs x2 and magnesium sulfate 1g IV x1 as repletion to ensure labs stay WNL -Continue Clinimix NO LYTES 5/15 at 1610mL/hr with 20% IVFE at 7410mL/hr. This is providing 100% of patient's goals -Daily MVI and TE in TPN -12 units regular insulin in TPN bag (patient will receive ~10.5 units) -Continue SSI -MIVF as per MD orders  -CMET, mag and phos in the morning   Shawana Knoch D. Tyjai Matuszak, PharmD, BCPS Clinical Pharmacist Pager: [phone] 12/30/2014 8:27 AM

## 2014-12-30 NOTE — Progress Notes (Signed)
ANTIBIOTIC CONSULT NOTE - FOLLOW UP  Pharmacy Consult for vancomycin Indication: r/o sepsis  Allergies  Allergen Reactions  . Oxycodone Hives and Other (See Comments)    Hallucinations    Patient Measurements: Height: 6\' 1"  (185.4 cm) Weight: 231 lb 11.3 oz (105.1 kg) IBW/kg (Calculated) : 79.9  Vital Signs: Temp: 98.9 F (37.2 C) (02/25 0506) Temp Source: Oral (02/25 0506) BP: 65/28 mmHg (02/25 1235) Pulse Rate: 87 (02/25 1235) Intake/Output from previous day: 02/24 0701 - 02/25 0700 In: 4553.8 [I.V.:1143.8; IV Piggyback:650; TPN:2760] Out: 3800 [Urine:500; Drains:1500; Stool:1800] Intake/Output from this shift:    Labs:  Recent Labs  12/27/14 1500  12/28/14 0425 12/29/14 0601 12/30/14 0430 12/30/14 0730 12/30/14 0744  WBC  --   < > 17.6* 25.0*  --  30.3* 31.5*  HGB  --   < > 8.5* 9.6*  --  8.2* 8.8*  PLT  --   < > 145* 181  --  160 154  LABCREA 67.13  --   --   --   --   --   --   CREATININE  --   --  1.51* 1.58* 2.28*  --   --   < > = values in this interval not displayed. Estimated Creatinine Clearance: 41.7 mL/min (by C-G formula based on Cr of 2.28). No results for input(s): VANCOTROUGH, VANCOPEAK, VANCORANDOM, GENTTROUGH, GENTPEAK, GENTRANDOM, TOBRATROUGH, TOBRAPEAK, TOBRARND, AMIKACINPEAK, AMIKACINTROU, AMIKACIN in the last 72 hours.   Microbiology: Recent Results (from the past 720 hour(s))  Surgical pcr screen     Status: None   Collection Time: 12/06/2014  3:13 PM  Result Value Ref Range Status   MRSA, PCR NEGATIVE NEGATIVE Final   Staphylococcus aureus NEGATIVE NEGATIVE Final    Comment:        The Xpert SA Assay (FDA approved for NASAL specimens in patients over 65 years of age), is one component of a comprehensive surveillance program.  Test performance has been validated by [facility] for patients greater than or equal to 65 year old. It is not intended to diagnose infection nor to guide or monitor treatment.   Clostridium Difficile by  PCR     Status: None   Collection Time: 12/21/14  3:32 AM  Result Value Ref Range Status   C difficile by pcr NEGATIVE NEGATIVE Final  Culture, blood (routine x 2)     Status: None (Preliminary result)   Collection Time: 12/25/14 11:00 AM  Result Value Ref Range Status   Specimen Description BLOOD LEFT HAND  Final   Special Requests BOTTLES DRAWN AEROBIC ONLY 7CC  Final   Culture   Final           BLOOD CULTURE RECEIVED NO GROWTH TO DATE CULTURE WILL BE HELD FOR 5 DAYS BEFORE ISSUING A FINAL NEGATIVE REPORT Performed at [facility]    Report Status PENDING  Incomplete  Urine culture     Status: None   Collection Time: 12/25/14 11:00 AM  Result Value Ref Range Status   Specimen Description URINE, RANDOM  Final   Special Requests NONE  Final   Colony Count   Final    1,000 COLONIES/ML Performed at [facility]    Culture   Final    INSIGNIFICANT GROWTH Performed at [facility]    Report Status 12/26/2014 FINAL  Final  Culture, blood (routine x 2)     Status: None (Preliminary result)   Collection Time: 12/25/14 11:10 AM  Result Value  Ref Range Status   Specimen Description BLOOD LEFT HAND  Final   Special Requests BOTTLES DRAWN AEROBIC ONLY 5CC  Final   Culture   Final           BLOOD CULTURE RECEIVED NO GROWTH TO DATE CULTURE WILL BE HELD FOR 5 DAYS BEFORE ISSUING A FINAL NEGATIVE REPORT Performed at [facility]    Report Status PENDING  Incomplete  MRSA PCR Screening     Status: None   Collection Time: 12/29/14  1:07 PM  Result Value Ref Range Status   MRSA by PCR NEGATIVE NEGATIVE Final    Comment:        The GeneXpert MRSA Assay (FDA approved for NASAL specimens only), is one component of a comprehensive MRSA colonization surveillance program. It is not intended to diagnose MRSA infection nor to guide or monitor treatment for MRSA infections.   Clostridium Difficile by PCR     Status: None   Collection Time: 12/29/14  4:08 PM   Result Value Ref Range Status   C difficile by pcr NEGATIVE NEGATIVE Final    Assessment: 63 yom currently on IV Zosyn restarting Vancomycin for newly developed altered mental status and r/o sepsis. S/p zosyn x 8d for intra-abdominal coverage post ex-lap with SBR 2/11 (back to OR 2/13 and 2/19). Per CCM, doubt abdominal source since CT abd 2/24 ruled out ischemia, abscesses or recurrent obstruction. WBC has continued to trend up to 31.5, Pt is afebrile. CrCl ~ 40-45 mL/min   Zosyn 2/11>> 2/18; 2/20>> 2/20 vanc>>2/23; 2/25>>   2/20 BCx2>>ngtd 2/20 UC>> insignif growth 2/25 BCx x2>>   2/16 C diff neg 2/11 MRSA screen neg  Goal of Therapy:  Vancomycin trough level 15-20 mcg/ml  Plan:  -Zosyn 3.375g IV q8h - 4h inf -Vancomycin  IV q24h -F/u LA, PCal -F/u clinical progress, c/s, abx plan, VT@SS  when appropriate -Follow renal function closely with dosing  Vinnie Level, PharmD., BCPS Clinical Pharmacist Pager [phone]

## 2014-12-30 NOTE — Progress Notes (Signed)
SLP Cancellation Note  Patient Details Name: Kyle West MRN: [id number] DOB: 1951/07/12   Cancelled treatment:       Reason Eval/Treat Not Completed: Medical issues which prohibited therapy. Medical issues which prohibited therapy (lethargic, not following commands, on Bipap.)    Lory Galan, Riley NearingBonnie Caroline 12/30/2014, 8:56 AM

## 2014-12-30 NOTE — Progress Notes (Signed)
PROGRESS NOTE  Kyle West NWG:[id number] DOB: [birth year] DOA: 12/28/2014 PCP: Charmaine Downs, MD  Interim History 64yo male with hx HTN, CAD s/p CABG, cirrhosis of unclear etiology (followed at [facility]), reported recent hx pancreatitis. Presented from outside hospital on  12/14/2014 with 2 day hx worsening abd pain, nausea and vomiting. CT at outside hospital revealed pneumatosis, hepatic and portal venous gas and infrarenal AAA. He was tx to [facility] for further eval with concern for ischemic bowel. The patient was started on intravenous Zosyn and vancomycin for sepsis and peritonitis secondary to ischemic bowel. Gen. surgery was consulted. The patient underwent a laparotomy on 12/20/2014 with small bowel resection and application of wound VAC(Dr Cornett). Postoperatively, the patient was left intubated. He developed hypotension requiring central venous catheter and vasopressors. The patient was taken back to the OR on 12/07/2014 for a second look operation secondary to ischemia involving his ileum and small bowel resection. The small bowel remained viable with no signs of ischemia. He was weaned off of vasopressors and extubated on 12/20/2014.  The patient's postoperative course was also complicated by atrial fibrillation with RVR.  He was placed on amiodarone drip for short period of time which was d/ced on 2/16 . He has converted back to sinus rhythm. The patient was taken back to surgery on 12/08/2014 for dehiscence of his abdominal wall wound. After surgery on 2/18, he had another episode of Aflutter which has since converted back to sinus. Discontinue postoperative course was complicated by acute encephalopathy that was multifactorial including renal failure, opioids, and sepsis.   Assessment/Plan: Ischemic bowel/peritonitis/sepsis Metabolic acidosis Respiratory failure Repeat CT scan essentially negative, WBC count continues to increase Lactic acid of ok , PCCM  consulted Patient started on vasopressors to keep MAP greater than 65 2/11 for exp lap r/t ischemic bowel/SBO. Course c/b Afib with RVR and abd wound dehiscence on 2/18 with return to OR. Patient made nothing by mouth, currently on BiPAP-Continue TPN per surgery CDiff 2/16>>> Urine 2/20>>>neg BCx2 2/25>>> Urine 2/25>>> Zosyn 2/20>>> vanc 2/25>>>  Diarrhea C. difficile negative 2, 2/16, 2/24   Tachypnea, compensating, ABG shows significant metabolic acidosis, pH 7.32, PCO2 of 21.7   secondary to azotemia, diarrhea The patient on low-dose sodium bicarbonate infusion Chest x-ray shows worsening pulmonary edema, continue BiPAP Obtain 2-D echo  Wound Dehiscence -2/18--s/p repair  Acute kidney injury-baseline 1.0 Likely prerenal -Secondary to sepsis and fluid shifts in setting of hypotension and large ascites outpt -had hypotension after surgery 2/18 Switch from sodium bicarbonate to sodium chloride IVF, in addition to TPN  currently on vasopressors may need CRRT; check BMP at 1400 2/25  Hyponatremia- Septic shock -Secondary to ischemic bowel/peritonitis Restarted on vasopressors 2/25 -Initially Weaned off vasopressors 12/20/2014 -Continue IV Zosyn--previously last day was 12/13/2014  12/25/14--procalcitonin--9.58,  -12/25/14--lactic acid--1.7-->1.0 (2/23) -12/25/14--restarted abx-->blood and urine cultures remain neg Zosyn 2/20>>> vanc 2/25>>>   Delirium/Acute Encephalopathy -multifactorial including sepsis, opioids, renal failure/hyponatremia Avoid benzodiazepines and opioids   Acute respiratory failure -Patient was intubated February 11>>> February 15 -presently stable on RA-->100% saturations -CXR--bibasilar atelectasis; pulmonary edema High risk for intubation   PAF with RVR -had another episode 2/18 after surgery -spontaneously Back in sinus -CHADS-VASc = 1 Continue low-dose metoprolol,   Hyperglycemia -Hemoglobin A1c--5.5 -Likely stress-induced and  TPN  NASH cirrhosis -Patient follows with hepatology at [facility] Discontinue lactulose because of diarrhea Hold rifaximin   Hold Actigall   -Ammonia 35-->27  Diarrhea -Cdiff neg on 2/16  Hypertension -Transition IV to po metoprolol tartrate when able to take po  Deconditioning  -PT recommends CIR evaluation--family wants rehab at [facility] Communication:   None today Disposition Plan:   Transfer to stepdown        Procedures/Studies: Ct Abdomen Pelvis Wo Contrast  12/29/2014   CLINICAL DATA:  Status post bowel resection on 12/19/2014, status post laparotomy on 02/13 and 2/18, evaluate for intra-abdominal abscess or necrotic bowel  EXAM: CT ABDOMEN AND PELVIS WITHOUT CONTRAST  TECHNIQUE: Multidetector CT imaging of the abdomen and pelvis was performed following the standard protocol without IV contrast.  COMPARISON:  None.  FINDINGS: Motion degraded images.  Lower chest:  Lung bases are essentially clear.  Hepatobiliary: Cirrhotic configuration of the liver.  Gallbladder is unremarkable. No intrahepatic or extrahepatic ductal dilatation.  Pancreas: Within normal limits.  Spleen: Within normal limits.  Adrenals/Urinary Tract: Adrenal glands are unremarkable.  Kidneys are within normal limits. No renal calculi or hydronephrosis.  Bladder is within normal limits.  Stomach/Bowel: Stomach is within normal limits.  Moderate duodenal diverticulum along the 1st/2nd portion of the duodenum (series 2/image 28).  No evidence of bowel obstruction.  Prior small bowel resection with suture lines in the right mid abdomen (series 2/image 65).  Wall thickening involving multiple segments of small bowel, including in the left lower abdomen (series 2/image 61 and 72) and beneath the right mid anterior abdominal wall (series 2/ image 57).  Wall thickening involving multiple segments of colon, including the cecum/right colon (series 2/image 50).  This generalized appearance of wall thickening is not overly  concerning for ischemia. Additionally, there is no pneumatosis.  Vascular/Lymphatic: Atherosclerotic calcifications of the abdominal aorta and branch vessels, including mild calcification involving the proximal celiac artery (series 2/image 20) and moderate calcifications involving the SMA (series 2/ image 34). Vessel patency cannot be assessed on noncontrast examination.  SMV is mildly prominent while the portal vein is diminutive. There is no stranding in the fat surrounding the SMV.  4.1 x 4.2 cm infrarenal abdominal aortic aneurysm (series 2/image 48).  Reproductive: Prostate is unremarkable.  Other: Small volume abdominopelvic ascites.  Small volume pneumoperitoneum under the diaphragm (series 2/ image 20). Additional scattered foci free air beneath the anterior abdominal wall (for example, series 2/ image 44). This appearance could be related to recent laparotomy.  Postsurgical changes overlying the midline anterior abdominal wall.  Musculoskeletal: Degenerative changes of the visualized thoracolumbar spine.  Mild to moderate compression fracture deformity involving the anterior aspect of the L1 vertebral body (sagittal image 96). Prominent Schmorl's node involving the superior endplate at L2.  IMPRESSION: No findings specific for bowel ischemia on this noncontrast examination. No pneumatosis.  Scattered areas of small bowel and colonic wall thickening, including the cecum/right colon.  No evidence of bowel obstruction.  No evidence of intra abdominal abscess.  Small volume pneumoperitoneum with scattered foci of free air beneath the anterior abdominal wall, likely related to recent laparotomy.  4.2 cm infrarenal abdominal aortic aneurysm.  Cirrhosis.  Additional ancillary findings as above.   Electronically Signed   By: Charline Bills M.D.   On: 12/29/2014 17:20   Dg Abd 1 View  12/29/2014   CLINICAL DATA:  Nasogastric tube placement.  EXAM: ABDOMEN - 1 VIEW  COMPARISON:  None.  FINDINGS: A nasogastric  tube is seen with tip in the distal esophagus near the gastroesophageal junction. Mild dilatation of small bowel and colon is seen likely due to ileus. Abdominal wall retention sutures  noted.  IMPRESSION: Nasogastric tube tip overlies the distal esophagus near the GE junction. Mild ileus pattern.   Electronically Signed   By: Myles Rosenthal M.D.   On: 12/29/2014 12:03   Dg Chest Port 1 View  12/30/2014   CLINICAL DATA:  Subsequent evaluation for fluid retention  EXAM: PORTABLE CHEST - 1 VIEW  COMPARISON:  12/29/2014  FINDINGS: Mild to moderate cardiac enlargement stable. Status post prior CABG. Left internal jugular central line stable.  Moderate vascular congestion. There are heel wall thickening. Mild interstitial prominence.  IMPRESSION: Congestive heart failure with mild interstitial pulmonary edema, slightly worse when compared to prior study.   Electronically Signed   By: Esperanza Heir M.D.   On: 12/30/2014 10:24   Dg Chest Port 1 View  12/29/2014   CLINICAL DATA:  64 year old male with acute shortness of Breath. Initial encounter.  EXAM: PORTABLE CHEST - 1 VIEW  COMPARISON:  12/24/2014 and earlier.  FINDINGS: Portable AP semi upright view at 0921 hours. Lower lung volumes. Stable cardiac size and mediastinal contours. Sequelae of CABG. Stable left IJ central line. No pneumothorax. No pleural effusion or consolidation. Stable pulmonary vascularity. No confluent pulmonary opacity. Increased gaseous distension of the stomach.  IMPRESSION: Lower lung volumes otherwise no acute cardiopulmonary abnormality.  Increased gaseous distension of bowel in the left upper abdomen.   Electronically Signed   By: Odessa Fleming M.D.   On: 12/29/2014 09:40   Dg Chest Port 1 View  12/24/2014   CLINICAL DATA:  Dyspnea, confusion, fever, history cirrhosis, coronary artery disease, hypertension, pancreatitis, former smoker  EXAM: PORTABLE CHEST - 1 VIEW  COMPARISON:  Portable exam 1326 hours compared to 12/19/2014  FINDINGS:  Interval removal of endotracheal and nasogastric tubes.  LEFT jugular central venous catheter tip projects over SVC.  Normal heart size post CABG.  Mediastinal contours and pulmonary vascularity normal.  Decreased bibasilar atelectasis.  Linear subsegmental atelectasis LEFT upper lobe.  Remaining lungs clear.  No pleural effusion or pneumothorax.  IMPRESSION: Decreased bibasilar and slightly increased LEFT upper lobe subsegmental atelectasis.   Electronically Signed   By: Ulyses Southward M.D.   On: 12/24/2014 14:42   Dg Chest Portable 1 View  12/19/2014   CLINICAL DATA:  Respiratory failure.  EXAM: PORTABLE CHEST - 1 VIEW  COMPARISON:  2015-01-04  FINDINGS: Endotracheal tube is in place with tip just above the level of the carina. Nasogastric tube is in place with tip overlying the level of the stomach. Patient has had median sternotomy and CABG. Left IJ central line tip overlies the superior vena cava.  The heart is enlarged. There is dense opacity of the left lung base consistent with atelectasis or infiltrate. Small bilateral pleural effusions are identified. There has been some improvement in aeration of the right lung base.  IMPRESSION: 1. Postoperative lines and tubes. 2. Slightly improved aeration of the right lung base. 3. Persistent left lung base atelectasis and effusion.   Electronically Signed   By: Norva Pavlov M.D.   On: 12/19/2014 08:04   Dg Chest Port 1 View  January 04, 2015   CLINICAL DATA:  Hypoxia/respiratory failure  EXAM: PORTABLE CHEST - 1 VIEW  COMPARISON:  December 17, 2014  FINDINGS: Endotracheal tube tip is 2.2 cm above the carina. Nasogastric tube tip and side port are in the stomach. Central catheter tip is in the superior vena cava. No pneumothorax. There is left lower lobe consolidation with small left effusion. There is a small right effusion with right  base atelectasis. Heart is mildly enlarged. Patient is status post coronary artery bypass grafting. No adenopathy.  IMPRESSION: Tube  and catheter positions as described without pneumothorax. Areas of consolidation with effusions. Heart prominent but stable.   Electronically Signed   By: Bretta Bang III M.D.   On: 01/01/2015 14:48   Dg Chest Port 1 View  12/17/2014   CLINICAL DATA:  Respiratory failure.  EXAM: PORTABLE CHEST - 1 VIEW  COMPARISON:  December 16, 2014.  FINDINGS: Stable cardiomediastinal silhouette. Endotracheal and nasogastric tubes are unchanged in position. Left internal jugular catheter is also noted with distal tip overlying expected position of the SVC. Status post coronary artery bypass graft. Bibasilar opacities are noted most consistent with subsegmental atelectasis. No pneumothorax is noted. No significant pleural effusion is noted.  IMPRESSION: Stable support apparatus. Stable bibasilar subsegmental atelectasis.   Electronically Signed   By: Lupita Raider, M.D.   On: 12/17/2014 07:53   Dg Chest Port 1 View  12/31/2014   CLINICAL DATA:  Status post central line placement  EXAM: PORTABLE CHEST - 1 VIEW  COMPARISON:  Chest film from earlier in the same day  FINDINGS: Postsurgical changes are again seen. An endotracheal tube is now noted 5.2 cm above the carina. A nasogastric catheter is seen extending into the stomach. A left jugular central line is noted with the tip in the mid superior vena cava. No pneumothorax is seen. Bibasilar atelectatic changes are again noted.  IMPRESSION: Tubes and lines as described above. No pneumothorax is noted. Stable bibasilar atelectatic changes are seen.   Electronically Signed   By: Alcide Clever M.D.   On: 12/27/2014 19:11   Dg Chest Port 1 View  12/07/2014   CLINICAL DATA:  Abdominal aortic aneurysm. Shortness of breath. Coronary artery disease. Pre-op respiratory exam  EXAM: PORTABLE CHEST - 1 VIEW  COMPARISON:  None.  FINDINGS: Low lung volumes are seen. Streaky opacity lung bases is likely due to atelectasis, although pneumonia cannot definitely be excluded. No evidence  of pleural effusion. Heart size is within normal limits allowing for low lung volumes. Previous CABG noted.  IMPRESSION: Low lung volumes with probable bibasilar atelectasis, although pneumonia cannot definitely be excluded.   Electronically Signed   By: Myles Rosenthal M.D.   On: 12/15/2014 13:16        Subjective: Patient appears to be more tender today, on BiPAP, hypotensive, and spontaneously only to verbal stimuli  Objective: Filed Vitals:   12/30/14 0513 12/30/14 0516 12/30/14 0600 12/30/14 0630  BP: 64/39 71/25 81/22  81/30  Pulse: 88 91 87 87  Temp:      TempSrc:      Resp: 25 29 24 24   Height:      Weight:      SpO2: 99% 100% 100% 100%    Intake/Output Summary (Last 24 hours) at 12/30/14 1208 Last data filed at 12/30/14 0630  Gross per 24 hour  Intake 3903.75 ml  Output   2100 ml  Net 1803.75 ml   Weight change: 0.858 kg (1 lb 14.3 oz) Exam:   General:  Obtunded on BiPAP  HEENT: No icterus, No thrush,  [place]/AT  Cardiovascular: RRR, S1/S2, no rubs, no gallops  Respiratory: CTA bilaterally, no wheezing, no crackles, no rhonchi  Abdomen: Soft/+BS, abdominal wall with clear fluid drainage. non distended, no guarding  Extremities: No edema, No lymphangitis, No petechiae, No rashes, no synovitis  Data Reviewed: Basic Metabolic Panel:  Recent Labs Lab 12/26/14 1100 12/27/14  9147 12/28/14 0425 12/29/14 0601 12/30/14 0430  NA 134* 130* 128* 124* 122*  K 4.5 4.1 4.2 4.0 3.7  CL 109 108 106 105 99  CO2 20 18* 17* 13* 14*  GLUCOSE 127* 125* 145* 148* 179*  BUN 78* 78* 73* 72* 93*  CREATININE 1.59* 1.68* 1.51* 1.58* 2.28*  CALCIUM 7.5* 7.2* 7.0* 7.0* 7.1*  MG 2.9* 2.7* 2.5 2.2 1.9  PHOS 5.0* 4.4 3.9 4.7* 5.6*   Liver Function Tests:  Recent Labs Lab 12/25/14 0500 12/27/14 0437 12/29/14 1000 12/30/14 0430  AST 62* 71* 62* 72*  ALT 34  ALKPHOS 90 112 121* 103  BILITOT 3.1* 2.8* 2.6* 2.3*  PROT 4.2* 4.7* 4.4* 3.8*  ALBUMIN 2.3* 1.9* 1.7* 1.4*    No results for input(s): LIPASE, AMYLASE in the last 168 hours.  Recent Labs Lab 12/24/14 1411 12/30/14 0800  AMMONIA 27 43*   CBC:  Recent Labs Lab 12/27/14 0437 12/28/14 0425 12/29/14 0601 12/30/14 0730 12/30/14 0744  WBC 20.5* 17.6* 25.0* 30.3* 31.5*  NEUTROABS 16.2*  --   --   --  26.1*  HGB 8.7* 8.5* 9.6* 8.2* 8.8*  HCT 24.9* 24.2* 28.1* 23.4* 24.7*  MCV 96.1 95.3 98.3 94.0 96.1  PLT 145* 145* 181 160 154   Cardiac Enzymes:  Recent Labs Lab 12/29/14 1000  TROPONINI <0.03   BNP: Invalid input(s): POCBNP CBG:  Recent Labs Lab 12/29/14 0805 12/29/14 1242 12/29/14 1803 12/29/14 2323 12/30/14 0817  GLUCAP 157* 152* 151* 179* 158*    Recent Results (from the past 240 hour(s))  Clostridium Difficile by PCR     Status: None   Collection Time: 12/21/14  3:32 AM  Result Value Ref Range Status   C difficile by pcr NEGATIVE NEGATIVE Final  Culture, blood (routine x 2)     Status: None (Preliminary result)   Collection Time: 12/25/14 11:00 AM  Result Value Ref Range Status   Specimen Description BLOOD LEFT HAND  Final   Special Requests BOTTLES DRAWN AEROBIC ONLY 7CC  Final   Culture   Final           BLOOD CULTURE RECEIVED NO GROWTH TO DATE CULTURE WILL BE HELD FOR 5 DAYS BEFORE ISSUING A FINAL NEGATIVE REPORT Performed at [facility]    Report Status PENDING  Incomplete  Urine culture     Status: None   Collection Time: 12/25/14 11:00 AM  Result Value Ref Range Status   Specimen Description URINE, RANDOM  Final   Special Requests NONE  Final   Colony Count   Final    1,000 COLONIES/ML Performed at [facility]    Culture   Final    INSIGNIFICANT GROWTH Performed at [facility]    Report Status 12/26/2014 FINAL  Final  Culture, blood (routine x 2)     Status: None (Preliminary result)   Collection Time: 12/25/14 11:10 AM  Result Value Ref Range Status   Specimen Description BLOOD LEFT HAND  Final   Special Requests  BOTTLES DRAWN AEROBIC ONLY 5CC  Final   Culture   Final           BLOOD CULTURE RECEIVED NO GROWTH TO DATE CULTURE WILL BE HELD FOR 5 DAYS BEFORE ISSUING A FINAL NEGATIVE REPORT Performed at [facility]    Report Status PENDING  Incomplete  MRSA PCR Screening     Status: None   Collection Time: 12/29/14  1:07 PM  Result Value  Ref Range Status   MRSA by PCR NEGATIVE NEGATIVE Final    Comment:        The GeneXpert MRSA Assay (FDA approved for NASAL specimens only), is one component of a comprehensive MRSA colonization surveillance program. It is not intended to diagnose MRSA infection nor to guide or monitor treatment for MRSA infections.   Clostridium Difficile by PCR     Status: None   Collection Time: 12/29/14  4:08 PM  Result Value Ref Range Status   C difficile by pcr NEGATIVE NEGATIVE Final     Scheduled Meds: . chlorhexidine  15 mL Mouth Rinse BID  . feeding supplement (ENSURE COMPLETE)  237 mL Oral BID BM  . heparin subcutaneous  5,000 Units Subcutaneous 3 times per day  . insulin aspart  0-9 Units Subcutaneous TID WC  . magnesium sulfate 1 - 4 g bolus IVPB  1 g Intravenous Once  . metoprolol  2.5 mg Intravenous Q6H  . pantoprazole (PROTONIX) IV  40 mg Intravenous Q24H  . piperacillin-tazobactam (ZOSYN)  IV  3.375 g Intravenous Q8H  . potassium chloride  10 mEq Intravenous Q1 Hr x 2  . vancomycin  1,250 mg Intravenous Q24H   Continuous Infusions: . TPN (CLINIMIX) Adult without lytes 110 mL/hr at 12/29/14 1721   And  . fat emulsion 240 mL (12/29/14 1721)  . TPN (CLINIMIX) Adult without lytes     And  . fat emulsion    . phenylephrine (NEO-SYNEPHRINE) Adult infusion 30 mcg/min (12/30/14 1205)  . sodium chloride 0.9 % 1,000 mL infusion 125 mL/hr at 12/24/14 1300     [facility]onaBROL,Matha Masse,   [facility] Pager [phone]  If 7PM-7AM, please contact night-coverage [email or web address] Password [email or web address] 12/30/2014, 12:08 PM   LOS: 14 days

## 2014-12-30 NOTE — Progress Notes (Signed)
Attempted R IJ CVL without success.  Vessel cannulated x 2, good blood return but unable to pass wire.  CXR pending   Dirk DressKaty Raymir Frommelt, NP 12/30/2014  1:31 PM Pager: [phone] or [phone]

## 2014-12-30 NOTE — Progress Notes (Signed)
Patient ID: Kyle West, male   DOB: [birth year], 64 y.o.   MRN: [id number]     [address] [place], [place] [address]    Phone: [phone] FAX: [phone]     Subjective: Pt on CPAP, confused, pulling at medical devices, lethargic, but does not follow commands.  CBC is pending.  He has been afebrile, Zosyn was resumed.  Hypotensive and tachypnic, no tachycardia.  His sodium has dropped despite sodium bicarb drip.  Objective:  Vital signs:  Filed Vitals:   12/30/14 0513 12/30/14 0516 12/30/14 0600 12/30/14 0630  BP: 64/39 71/25 81/22  81/30  Pulse: 88 91 87 87  Temp:      TempSrc:      Resp: 25 29 24 24   Height:      Weight:      SpO2: 99% 100% 100% 100%    Last BM Date: 12/29/14  Intake/Output   Yesterday:  02/24 0701 - 02/25 0700 In: 4553.8 [I.V.:1143.8; IV Piggyback:650; TPN:2760] Out: 3800 [Urine:500; Drains:1500; YTWKM:6286] This shift:    I/O last 3 completed shifts: In: 7518.6 [I.V.:2468.8; IV Piggyback:700] Out: 6025 [Urine:925; Drains:1675; Stool:3425]    Physical Exam: General: Pt lethargic.  On bipap.   Chest: diminished, clear.  Increased RR.  No chest wall pain w good excursion CV:  Pulses intact.  Regular rhythm Abdomen: Soft.  Nondistended. Non tender.  Midline wound with retention sutures in place.  Eakin's pouch with serous, blood tinged output.  No evidence of peritonitis.  No incarcerated hernias. Ext:  SCDs BLE.  No mjr edema.  No cyanosis Skin: No petechiae / purpura   Problem List:   Active Problems:   Abdominal pain   Ischemic bowel disease   Acute respiratory failure   Sepsis   AKI (acute kidney injury)   Peritonitis   Acute respiratory failure, unspecified whether with hypoxia or hypercapnia    Results:   Labs: Results for orders placed or performed during the hospital encounter of 12/19/2014 (from the past 48 hour(s))  Glucose, capillary     Status:  Abnormal   Collection Time: 12/28/14 12:30 PM  Result Value Ref Range   Glucose-Capillary 125 (H) 70 - 99 mg/dL  Glucose, capillary     Status: Abnormal   Collection Time: 12/28/14  3:50 PM  Result Value Ref Range   Glucose-Capillary 124 (H) 70 - 99 mg/dL  Glucose, capillary     Status: Abnormal   Collection Time: 12/28/14  9:08 PM  Result Value Ref Range   Glucose-Capillary 157 (H) 70 - 99 mg/dL  Procalcitonin     Status: None   Collection Time: 12/29/14  6:01 AM  Result Value Ref Range   Procalcitonin 5.71 ng/mL    Comment:        Interpretation: PCT > 2 ng/mL: Systemic infection (sepsis) is likely, unless other causes are known. (NOTE)         ICU PCT Algorithm               Non ICU PCT Algorithm    ----------------------------     ------------------------------         PCT < 0.25 ng/mL                 PCT < 0.1 ng/mL     Stopping of antibiotics            Stopping of antibiotics  strongly encouraged.               strongly encouraged.    ----------------------------     ------------------------------       PCT level decrease by               PCT < 0.25 ng/mL       >= 80% from peak PCT       OR PCT 0.25 - 0.5 ng/mL          Stopping of antibiotics                                             encouraged.     Stopping of antibiotics           encouraged.    ----------------------------     ------------------------------       PCT level decrease by              PCT >= 0.25 ng/mL       < 80% from peak PCT        AND PCT >= 0.5 ng/mL            Continuing antibiotics                                               encouraged.       Continuing antibiotics            encouraged.    ----------------------------     ------------------------------     PCT level increase compared          PCT > 0.5 ng/mL         with peak PCT AND          PCT >= 0.5 ng/mL             Escalation of antibiotics                                          strongly encouraged.      Escalation of  antibiotics        strongly encouraged.   Basic metabolic panel     Status: Abnormal   Collection Time: 12/29/14  6:01 AM  Result Value Ref Range   Sodium 124 (L) 135 - 145 mmol/L   Potassium 4.0 3.5 - 5.1 mmol/L   Chloride 105 96 - 112 mmol/L   CO2 13 (L) 19 - 32 mmol/L   Glucose, Bld 148 (H) 70 - 99 mg/dL   BUN 72 (H) 6 - 23 mg/dL   Creatinine, Ser 1.58 (H) 0.50 - 1.35 mg/dL   Calcium 7.0 (L) 8.4 - 10.5 mg/dL   GFR calc non Af Amer 45 (L) >90 mL/min   GFR calc Af Amer 52 (L) >90 mL/min    Comment: (NOTE) The eGFR has been calculated using the CKD EPI equation. This calculation has not been validated in all clinical situations. eGFR's persistently <90 mL/min signify possible Chronic Kidney Disease.    Anion gap 6 5 - 15  Magnesium     Status: None   Collection Time: 12/29/14  6:01 AM  Result Value Ref Range   Magnesium 2.2 1.5 - 2.5 mg/dL  Phosphorus     Status: Abnormal   Collection Time: 12/29/14  6:01 AM  Result Value Ref Range   Phosphorus 4.7 (H) 2.3 - 4.6 mg/dL  CBC     Status: Abnormal   Collection Time: 12/29/14  6:01 AM  Result Value Ref Range   WBC 25.0 (H) 4.0 - 10.5 K/uL   RBC 2.86 (L) 4.22 - 5.81 MIL/uL   Hemoglobin 9.6 (L) 13.0 - 17.0 g/dL   HCT 28.1 (L) 39.0 - 52.0 %   MCV 98.3 78.0 - 100.0 fL   MCH 33.6 26.0 - 34.0 pg   MCHC 34.2 30.0 - 36.0 g/dL   RDW 16.6 (H) 11.5 - 15.5 %   Platelets 181 150 - 400 K/uL  Glucose, capillary     Status: Abnormal   Collection Time: 12/29/14  8:05 AM  Result Value Ref Range   Glucose-Capillary 157 (H) 70 - 99 mg/dL   Comment 1 Documented in Char   Lactic acid, plasma     Status: None   Collection Time: 12/29/14  9:24 AM  Result Value Ref Range   Lactic Acid, Venous 1.3 0.5 - 2.0 mmol/L  Hepatic function panel     Status: Abnormal   Collection Time: 12/29/14 10:00 AM  Result Value Ref Range   Total Protein 4.4 (L) 6.0 - 8.3 g/dL   Albumin 1.7 (L) 3.5 - 5.2 g/dL   AST 62 (H) 0 - 37 U/L   ALT 29 0 - 53 U/L    Alkaline Phosphatase 121 (H) 39 - 117 U/L   Total Bilirubin 2.6 (H) 0.3 - 1.2 mg/dL   Bilirubin, Direct 1.1 (H) 0.0 - 0.5 mg/dL   Indirect Bilirubin 1.5 (H) 0.3 - 0.9 mg/dL  Troponin I     Status: None   Collection Time: 12/29/14 10:00 AM  Result Value Ref Range   Troponin I <0.03 <0.031 ng/mL    Comment:        NO INDICATION OF MYOCARDIAL INJURY.   Blood gas, arterial     Status: Abnormal   Collection Time: 12/29/14 10:28 AM  Result Value Ref Range   FIO2 0.21 %   pH, Arterial 7.321 (L) 7.350 - 7.450   pCO2 arterial 21.7 (L) 35.0 - 45.0 mmHg   pO2, Arterial 74.0 (L) 80.0 - 100.0 mmHg   Bicarbonate 10.9 (L) 20.0 - 24.0 mEq/L   TCO2 11.6 0 - 100 mmol/L   Acid-base deficit 14.1 (H) 0.0 - 2.0 mmol/L   O2 Saturation 93.8 %   Patient temperature 98.6    Collection site RIGHT RADIAL    Drawn by [phone]    Sample type ARTERIAL DRAW    Allens test (pass/fail) PASS PASS  Glucose, capillary     Status: Abnormal   Collection Time: 12/29/14 12:42 PM  Result Value Ref Range   Glucose-Capillary 152 (H) 70 - 99 mg/dL   Comment 1 Capillary Specimen   MRSA PCR Screening     Status: None   Collection Time: 12/29/14  1:07 PM  Result Value Ref Range   MRSA by PCR NEGATIVE NEGATIVE    Comment:        The GeneXpert MRSA Assay (FDA approved for NASAL specimens only), is one component of a comprehensive MRSA colonization surveillance program. It is not intended to diagnose MRSA infection nor to guide or monitor treatment for MRSA infections.   Glucose, capillary  Status: Abnormal   Collection Time: 12/29/14  6:03 PM  Result Value Ref Range   Glucose-Capillary 151 (H) 70 - 99 mg/dL   Comment 1 Capillary Specimen   Glucose, capillary     Status: Abnormal   Collection Time: 12/29/14 11:23 PM  Result Value Ref Range   Glucose-Capillary 179 (H) 70 - 99 mg/dL  Magnesium     Status: None   Collection Time: 12/30/14  4:30 AM  Result Value Ref Range   Magnesium 1.9 1.5 - 2.5 mg/dL   Phosphorus     Status: Abnormal   Collection Time: 12/30/14  4:30 AM  Result Value Ref Range   Phosphorus 5.6 (H) 2.3 - 4.6 mg/dL  Comprehensive metabolic panel     Status: Abnormal   Collection Time: 12/30/14  4:30 AM  Result Value Ref Range   Sodium 122 (L) 135 - 145 mmol/L   Potassium 3.7 3.5 - 5.1 mmol/L   Chloride 99 96 - 112 mmol/L   CO2 14 (L) 19 - 32 mmol/L   Glucose, Bld 179 (H) 70 - 99 mg/dL   BUN 93 (H) 6 - 23 mg/dL   Creatinine, Ser 2.28 (H) 0.50 - 1.35 mg/dL   Calcium 7.1 (L) 8.4 - 10.5 mg/dL   Total Protein 3.8 (L) 6.0 - 8.3 g/dL   Albumin 1.4 (L) 3.5 - 5.2 g/dL   AST 72 (H) 0 - 37 U/L   ALT 34 0 - 53 U/L   Alkaline Phosphatase 103 39 - 117 U/L   Total Bilirubin 2.3 (H) 0.3 - 1.2 mg/dL   GFR calc non Af Amer 29 (L) >90 mL/min   GFR calc Af Amer 33 (L) >90 mL/min    Comment: (NOTE) The eGFR has been calculated using the CKD EPI equation. This calculation has not been validated in all clinical situations. eGFR's persistently <90 mL/min signify possible Chronic Kidney Disease.    Anion gap 9 5 - 15  Blood gas, arterial     Status: Abnormal   Collection Time: 12/30/14  6:55 AM  Result Value Ref Range   O2 Content 2.0 L/min   Delivery systems NASAL CONTINUOUS POSITIVE AIRWAY PRESSURE    pH, Arterial 7.322 (L) 7.350 - 7.450   pCO2 arterial 24.1 (L) 35.0 - 45.0 mmHg   pO2, Arterial 88.3 80.0 - 100.0 mmHg   Bicarbonate 12.1 (L) 20.0 - 24.0 mEq/L   TCO2 12.8 0 - 100 mmol/L   Acid-base deficit 12.8 (H) 0.0 - 2.0 mmol/L   O2 Saturation 95.8 %   Patient temperature 98.6    Collection site RIGHT RADIAL    Drawn by [phone]    Sample type ARTERIAL DRAW    Allens test (pass/fail) PASS PASS    Imaging / Studies: Ct Abdomen Pelvis Wo Contrast  12/29/2014   CLINICAL DATA:  Status post bowel resection on 12/17/2014, status post laparotomy on 02/13 and 2/18, evaluate for intra-abdominal abscess or necrotic bowel  EXAM: CT ABDOMEN AND PELVIS WITHOUT CONTRAST  TECHNIQUE:  Multidetector CT imaging of the abdomen and pelvis was performed following the standard protocol without IV contrast.  COMPARISON:  None.  FINDINGS: Motion degraded images.  Lower chest:  Lung bases are essentially clear.  Hepatobiliary: Cirrhotic configuration of the liver.  Gallbladder is unremarkable. No intrahepatic or extrahepatic ductal dilatation.  Pancreas: Within normal limits.  Spleen: Within normal limits.  Adrenals/Urinary Tract: Adrenal glands are unremarkable.  Kidneys are within normal limits. No renal calculi or hydronephrosis.  Bladder is  within normal limits.  Stomach/Bowel: Stomach is within normal limits.  Moderate duodenal diverticulum along the 1st/2nd portion of the duodenum (series 2/image 28).  No evidence of bowel obstruction.  Prior small bowel resection with suture lines in the right mid abdomen (series 2/image 65).  Wall thickening involving multiple segments of small bowel, including in the left lower abdomen (series 2/image 61 and 72) and beneath the right mid anterior abdominal wall (series 2/ image 57).  Wall thickening involving multiple segments of colon, including the cecum/right colon (series 2/image 50).  This generalized appearance of wall thickening is not overly concerning for ischemia. Additionally, there is no pneumatosis.  Vascular/Lymphatic: Atherosclerotic calcifications of the abdominal aorta and branch vessels, including mild calcification involving the proximal celiac artery (series 2/image 20) and moderate calcifications involving the SMA (series 2/ image 34). Vessel patency cannot be assessed on noncontrast examination.  SMV is mildly prominent while the portal vein is diminutive. There is no stranding in the fat surrounding the SMV.  4.1 x 4.2 cm infrarenal abdominal aortic aneurysm (series 2/image 48).  Reproductive: Prostate is unremarkable.  Other: Small volume abdominopelvic ascites.  Small volume pneumoperitoneum under the diaphragm (series 2/ image 20).  Additional scattered foci free air beneath the anterior abdominal wall (for example, series 2/ image 44). This appearance could be related to recent laparotomy.  Postsurgical changes overlying the midline anterior abdominal wall.  Musculoskeletal: Degenerative changes of the visualized thoracolumbar spine.  Mild to moderate compression fracture deformity involving the anterior aspect of the L1 vertebral body (sagittal image 96). Prominent Schmorl's node involving the superior endplate at L2.  IMPRESSION: No findings specific for bowel ischemia on this noncontrast examination. No pneumatosis.  Scattered areas of small bowel and colonic wall thickening, including the cecum/right colon.  No evidence of bowel obstruction.  No evidence of intra abdominal abscess.  Small volume pneumoperitoneum with scattered foci of free air beneath the anterior abdominal wall, likely related to recent laparotomy.  4.2 cm infrarenal abdominal aortic aneurysm.  Cirrhosis.  Additional ancillary findings as above.   Electronically Signed   By: Julian Hy M.D.   On: 12/29/2014 17:20   Dg Abd 1 View  12/29/2014   CLINICAL DATA:  Nasogastric tube placement.  EXAM: ABDOMEN - 1 VIEW  COMPARISON:  None.  FINDINGS: A nasogastric tube is seen with tip in the distal esophagus near the gastroesophageal junction. Mild dilatation of small bowel and colon is seen likely due to ileus. Abdominal wall retention sutures noted.  IMPRESSION: Nasogastric tube tip overlies the distal esophagus near the GE junction. Mild ileus pattern.   Electronically Signed   By: Earle Gell M.D.   On: 12/29/2014 12:03   Dg Chest Port 1 View  12/29/2014   CLINICAL DATA:  64 year old male with acute shortness of Breath. Initial encounter.  EXAM: PORTABLE CHEST - 1 VIEW  COMPARISON:  12/24/2014 and earlier.  FINDINGS: Portable AP semi upright view at 0921 hours. Lower lung volumes. Stable cardiac size and mediastinal contours. Sequelae of CABG. Stable left IJ  central line. No pneumothorax. No pleural effusion or consolidation. Stable pulmonary vascularity. No confluent pulmonary opacity. Increased gaseous distension of the stomach.  IMPRESSION: Lower lung volumes otherwise no acute cardiopulmonary abnormality.  Increased gaseous distension of bowel in the left upper abdomen.   Electronically Signed   By: Genevie Ann M.D.   On: 12/29/2014 09:40    Medications / Allergies:  Scheduled Meds: . chlorhexidine  15 mL Mouth Rinse BID  .  feeding supplement (ENSURE COMPLETE)  237 mL Oral BID BM  . heparin subcutaneous  5,000 Units Subcutaneous 3 times per day  . insulin aspart  0-9 Units Subcutaneous TID WC  . metoprolol  2.5 mg Intravenous Q6H  . piperacillin-tazobactam (ZOSYN)  IV  3.375 g Intravenous Q8H   Continuous Infusions: . TPN (CLINIMIX) Adult without lytes 110 mL/hr at 12/29/14 1721   And  . fat emulsion 240 mL (12/29/14 1721)  .  sodium bicarbonate infusion 1/4 NS 1000 mL 75 mL/hr at 12/30/14 0430  . sodium chloride 0.9 % 1,000 mL infusion 125 mL/hr at 12/24/14 1300   PRN Meds:.fentaNYL, haloperidol lactate, levalbuterol, ondansetron (ZOFRAN) IV, sodium chloride, white petrolatum  Antibiotics: Anti-infectives    Start     Dose/Rate Route Frequency Ordered Stop   12/26/14 0930  vancomycin (VANCOCIN) 1,250 mg in sodium chloride 0.9 % 250 mL IVPB  Status:  Discontinued     1,250 mg 166.7 mL/hr over 90 Minutes Intravenous Every 24 hours 12/25/14 0912 12/28/14 0850   12/25/14 1000  piperacillin-tazobactam (ZOSYN) IVPB 3.375 g     3.375 g 12.5 mL/hr over 240 Minutes Intravenous Every 8 hours 12/25/14 0921     12/25/14 0930  vancomycin (VANCOCIN) 1,750 mg in sodium chloride 0.9 % 500 mL IVPB     1,750 mg 250 mL/hr over 120 Minutes Intravenous  Once 12/25/14 0912 12/25/14 1208   12/25/14 0900  piperacillin-tazobactam (ZOSYN) IVPB 3.375 g  Status:  Discontinued     3.375 g 100 mL/hr over 30 Minutes Intravenous 3 times per day 12/25/14 0852  12/25/14 0921   12/10/2014 2200  [MAR Hold]  rifaximin (XIFAXAN) tablet 550 mg  Status:  Discontinued     (MAR Hold since 12/31/2014 1721)   550 mg Oral 2 times daily 12/12/2014 1634 12/14/2014 1726   12/13/2014 1430  piperacillin-tazobactam (ZOSYN) IVPB 3.375 g     3.375 g 12.5 mL/hr over 240 Minutes Intravenous 3 times per day 12/27/2014 1403 12/24/14 0347       Assessment/Plan Ischemic bowel  Septic shock POD 14/13/7, s/p ex lap with open abdomen and SBR, s/p relook and closure, s/p ex lap and closure with retentions for dehiscence -continue eakin's pouch to measure peritoneal output and keep up with fluid loss -SCD/heparin as tolerated -mobilize -can resume diet once more alert along with a calorie count. -continue with PTN -completed 8 days of zosyn, restarted on 2/20.  He has been afebrile.  Ct of abdomen and pelvis is negative for an abscess/intra-abdominal pathology.  Im not quite sure what we're treating as he has remained afebrile.  May need to be recultured.  Overall, he appears worse, Dr. Allyson Sabal notified.  Would recommend CCM consult for further assistance.  Cirrhosis -followed at [facility] -lactulose stopped -check ammonia level Post op ileus/PCM -resolved ileus, continue TPN until his cognition improves and calorie count is complete -TNA AKI  -peritoneal fluid output continues to decrease 3900--->3500--->3200--->2750--->1117m yesterday, hopefully will continue to decrease -he did not get IV contrast with CT yesterday, sCr has worsened, UOP down, on Na bicarb gtt -monitor I/Os delirium -worsened PAF Respiratory failure-increased oxygen demand, RR    EErby Pian ANP-BC [facility] Pager [phone](7A-4:30P) For consults and floor pages call [phone](7A-4:30P)   12/30/2014 7:57 AM

## 2014-12-30 NOTE — Progress Notes (Signed)
OT Cancellation Note  Patient Details Name: Kyle West MRN: [id number] DOB: Oct 08, 1951   Cancelled Treatment:    Reason Eval/Treat Not Completed: Medical issues which prohibited therapy (lethargic, not following commands, on Bipap.) RN requesting hold today.  [name], [name] 12/30/2014, 8:34 AM

## 2014-12-30 NOTE — Consult Note (Signed)
Reason for Consult:Acidemia, AKI Referring Physician: Dr. Colin Mulders Kyle West is an 64 y.o. male.  HPI: 64 yr male with hx of CAD, CABG, AAA, cirrhosis, pancreatitis presented to OSH with 2 d of abdm pain on 2/11.  Found to have pneumatosis and portal venous air.  Hosp here, and within 24 h deteriorated and had SB resx for ischemic bowel. Taken back for 2nd look 2d later then again for closure on2/18.  Has drain bag on abdm with large amt drainage .  Course complic by atrial dysrhythmias, recurrent hypotension. Today AMS, progressive resp difficulty, low bp , and metabolic acidemia so reentub and pressors used. Baseline Cr 1.4 (CKD3), and varied +/- .4 this hosp.  Yest 1.58 adn 2.28 today with bicarb or 12.        No hx availble regarding renal dz.  Recurrent hyponatremia this hosp.   Review of systems not obtained due to patient factors.    Past Medical History  Diagnosis Date  . Cirrhosis   . CAD (coronary artery disease)   . Hypertension   . Pancreatitis   . Depression   . AAA (abdominal aortic aneurysm) without rupture     Past Surgical History  Procedure Laterality Date  . Hernia repair    . Coronary artery bypass graft    . Laparotomy N/A 12/13/2014    Procedure: EXPLORATORY LAPAROTOMY;  Surgeon: Erroll Luna, MD;  Location: [place];  Service: General;  Laterality: N/A;  . Bowel resection N/A 12/15/2014    Procedure: SMALL BOWEL RESECTION;  Surgeon: Erroll Luna, MD;  Location: [place];  Service: General;  Laterality: N/A;  . Application of wound vac N/A 12/12/2014    Procedure: APPLICATION OF WOUND VAC;  Surgeon: Erroll Luna, MD;  Location: [place];  Service: General;  Laterality: N/A;  . Laparotomy N/A 12/25/2014    Procedure: EXPLORATORY LAPAROTOMY;  Surgeon: Erroll Luna, MD;  Location: [place];  Service: General;  Laterality: N/A;  . Laparotomy N/A 12/17/2014    Procedure: EXPLORATORY LAPAROTOMY WITH CLOSURE OF EVISCERATION;  Surgeon: Erroll Luna, MD;  Location: [place];   Service: General;  Laterality: N/A;    History reviewed. No pertinent family history.  Social History:  reports that he has quit smoking. His smoking use included Cigarettes. He smoked 1.50 packs per day. He quit smokeless tobacco use about 33 years ago. He reports that he does not drink alcohol. His drug history is not on file.  Allergies:  Allergies  Allergen Reactions  . Oxycodone Hives and Other (See Comments)    Hallucinations    Medications: I have reviewed the patient's current medications.   Results for orders placed or performed during the hospital encounter of 12/09/2014 (from the past 48 hour(s))  Glucose, capillary     Status: Abnormal   Collection Time: 12/28/14  9:08 PM  Result Value Ref Range   Glucose-Capillary 157 (H) 70 - 99 mg/dL  Procalcitonin     Status: None   Collection Time: 12/29/14  6:01 AM  Result Value Ref Range   Procalcitonin 5.71 ng/mL    Comment:        Interpretation: PCT > 2 ng/mL: Systemic infection (sepsis) is likely, unless other causes are known. (NOTE)         ICU PCT Algorithm               Non ICU PCT Algorithm    ----------------------------     ------------------------------  PCT < 0.25 ng/mL                 PCT < 0.1 ng/mL     Stopping of antibiotics            Stopping of antibiotics       strongly encouraged.               strongly encouraged.    ----------------------------     ------------------------------       PCT level decrease by               PCT < 0.25 ng/mL       >= 80% from peak PCT       OR PCT 0.25 - 0.5 ng/mL          Stopping of antibiotics                                             encouraged.     Stopping of antibiotics           encouraged.    ----------------------------     ------------------------------       PCT level decrease by              PCT >= 0.25 ng/mL       < 80% from peak PCT        AND PCT >= 0.5 ng/mL            Continuing antibiotics                                                encouraged.       Continuing antibiotics            encouraged.    ----------------------------     ------------------------------     PCT level increase compared          PCT > 0.5 ng/mL         with peak PCT AND          PCT >= 0.5 ng/mL             Escalation of antibiotics                                          strongly encouraged.      Escalation of antibiotics        strongly encouraged.   Basic metabolic panel     Status: Abnormal   Collection Time: 12/29/14  6:01 AM  Result Value Ref Range   Sodium 124 (L) 135 - 145 mmol/L   Potassium 4.0 3.5 - 5.1 mmol/L   Chloride 105 96 - 112 mmol/L   CO2 13 (L) 19 - 32 mmol/L   Glucose, Bld 148 (H) 70 - 99 mg/dL   BUN 72 (H) 6 - 23 mg/dL   Creatinine, Ser 1.58 (H) 0.50 - 1.35 mg/dL   Calcium 7.0 (L) 8.4 - 10.5 mg/dL   GFR calc non Af Amer 45 (L) >90 mL/min   GFR calc Af Amer 52 (L) >90 mL/min    Comment: (NOTE) The eGFR has been  calculated using the CKD EPI equation. This calculation has not been validated in all clinical situations. eGFR's persistently <90 mL/min signify possible Chronic Kidney Disease.    Anion gap 6 5 - 15  Magnesium     Status: None   Collection Time: 12/29/14  6:01 AM  Result Value Ref Range   Magnesium 2.2 1.5 - 2.5 mg/dL  Phosphorus     Status: Abnormal   Collection Time: 12/29/14  6:01 AM  Result Value Ref Range   Phosphorus 4.7 (H) 2.3 - 4.6 mg/dL  CBC     Status: Abnormal   Collection Time: 12/29/14  6:01 AM  Result Value Ref Range   WBC 25.0 (H) 4.0 - 10.5 K/uL   RBC 2.86 (L) 4.22 - 5.81 MIL/uL   Hemoglobin 9.6 (L) 13.0 - 17.0 g/dL   HCT 28.1 (L) 39.0 - 52.0 %   MCV 98.3 78.0 - 100.0 fL   MCH 33.6 26.0 - 34.0 pg   MCHC 34.2 30.0 - 36.0 g/dL   RDW 16.6 (H) 11.5 - 15.5 %   Platelets 181 150 - 400 K/uL  Glucose, capillary     Status: Abnormal   Collection Time: 12/29/14  8:05 AM  Result Value Ref Range   Glucose-Capillary 157 (H) 70 - 99 mg/dL   Comment 1 Documented in Char   Lactic acid,  plasma     Status: None   Collection Time: 12/29/14  9:24 AM  Result Value Ref Range   Lactic Acid, Venous 1.3 0.5 - 2.0 mmol/L  Hepatic function panel     Status: Abnormal   Collection Time: 12/29/14 10:00 AM  Result Value Ref Range   Total Protein 4.4 (L) 6.0 - 8.3 g/dL   Albumin 1.7 (L) 3.5 - 5.2 g/dL   AST 62 (H) 0 - 37 U/L   ALT 29 0 - 53 U/L   Alkaline Phosphatase 121 (H) 39 - 117 U/L   Total Bilirubin 2.6 (H) 0.3 - 1.2 mg/dL   Bilirubin, Direct 1.1 (H) 0.0 - 0.5 mg/dL   Indirect Bilirubin 1.5 (H) 0.3 - 0.9 mg/dL  Troponin I     Status: None   Collection Time: 12/29/14 10:00 AM  Result Value Ref Range   Troponin I <0.03 <0.031 ng/mL    Comment:        NO INDICATION OF MYOCARDIAL INJURY.   Blood gas, arterial     Status: Abnormal   Collection Time: 12/29/14 10:28 AM  Result Value Ref Range   FIO2 0.21 %   pH, Arterial 7.321 (L) 7.350 - 7.450   pCO2 arterial 21.7 (L) 35.0 - 45.0 mmHg   pO2, Arterial 74.0 (L) 80.0 - 100.0 mmHg   Bicarbonate 10.9 (L) 20.0 - 24.0 mEq/L   TCO2 11.6 0 - 100 mmol/L   Acid-base deficit 14.1 (H) 0.0 - 2.0 mmol/L   O2 Saturation 93.8 %   Patient temperature 98.6    Collection site RIGHT RADIAL    Drawn by [phone]    Sample type ARTERIAL DRAW    Allens test (pass/fail) PASS PASS  Glucose, capillary     Status: Abnormal   Collection Time: 12/29/14 12:42 PM  Result Value Ref Range   Glucose-Capillary 152 (H) 70 - 99 mg/dL   Comment 1 Capillary Specimen   MRSA PCR Screening     Status: None   Collection Time: 12/29/14  1:07 PM  Result Value Ref Range   MRSA by PCR NEGATIVE NEGATIVE    Comment:  The GeneXpert MRSA Assay (FDA approved for NASAL specimens only), is one component of a comprehensive MRSA colonization surveillance program. It is not intended to diagnose MRSA infection nor to guide or monitor treatment for MRSA infections.   Clostridium Difficile by PCR     Status: None   Collection Time: 12/29/14  4:08 PM  Result  Value Ref Range   C difficile by pcr NEGATIVE NEGATIVE  Glucose, capillary     Status: Abnormal   Collection Time: 12/29/14  6:03 PM  Result Value Ref Range   Glucose-Capillary 151 (H) 70 - 99 mg/dL   Comment 1 Capillary Specimen   Glucose, capillary     Status: Abnormal   Collection Time: 12/29/14 11:23 PM  Result Value Ref Range   Glucose-Capillary 179 (H) 70 - 99 mg/dL  Magnesium     Status: None   Collection Time: 12/30/14  4:30 AM  Result Value Ref Range   Magnesium 1.9 1.5 - 2.5 mg/dL  Phosphorus     Status: Abnormal   Collection Time: 12/30/14  4:30 AM  Result Value Ref Range   Phosphorus 5.6 (H) 2.3 - 4.6 mg/dL  Comprehensive metabolic panel     Status: Abnormal   Collection Time: 12/30/14  4:30 AM  Result Value Ref Range   Sodium 122 (L) 135 - 145 mmol/L   Potassium 3.7 3.5 - 5.1 mmol/L   Chloride 99 96 - 112 mmol/L   CO2 14 (L) 19 - 32 mmol/L   Glucose, Bld 179 (H) 70 - 99 mg/dL   BUN 93 (H) 6 - 23 mg/dL   Creatinine, Ser 2.28 (H) 0.50 - 1.35 mg/dL   Calcium 7.1 (L) 8.4 - 10.5 mg/dL   Total Protein 3.8 (L) 6.0 - 8.3 g/dL   Albumin 1.4 (L) 3.5 - 5.2 g/dL   AST 72 (H) 0 - 37 U/L   ALT 34 0 - 53 U/L   Alkaline Phosphatase 103 39 - 117 U/L   Total Bilirubin 2.3 (H) 0.3 - 1.2 mg/dL   GFR calc non Af Amer 29 (L) >90 mL/min   GFR calc Af Amer 33 (L) >90 mL/min    Comment: (NOTE) The eGFR has been calculated using the CKD EPI equation. This calculation has not been validated in all clinical situations. eGFR's persistently <90 mL/min signify possible Chronic Kidney Disease.    Anion gap 9 5 - 15  Blood gas, arterial     Status: Abnormal   Collection Time: 12/30/14  6:55 AM  Result Value Ref Range   O2 Content 2.0 L/min   Delivery systems NASAL CONTINUOUS POSITIVE AIRWAY PRESSURE    pH, Arterial 7.322 (L) 7.350 - 7.450   pCO2 arterial 24.1 (L) 35.0 - 45.0 mmHg   pO2, Arterial 88.3 80.0 - 100.0 mmHg   Bicarbonate 12.1 (L) 20.0 - 24.0 mEq/L   TCO2 12.8 0 - 100  mmol/L   Acid-base deficit 12.8 (H) 0.0 - 2.0 mmol/L   O2 Saturation 95.8 %   Patient temperature 98.6    Collection site RIGHT RADIAL    Drawn by [phone]    Sample type ARTERIAL DRAW    Allens test (pass/fail) PASS PASS  CBC     Status: Abnormal   Collection Time: 12/30/14  7:30 AM  Result Value Ref Range   WBC 30.3 (H) 4.0 - 10.5 K/uL    Comment: REPEATED TO VERIFY   RBC 2.49 (L) 4.22 - 5.81 MIL/uL   Hemoglobin 8.2 (L) 13.0 - 17.0  g/dL    Comment: REPEATED TO VERIFY   HCT 23.4 (L) 39.0 - 52.0 %   MCV 94.0 78.0 - 100.0 fL   MCH 32.9 26.0 - 34.0 pg   MCHC 35.0 30.0 - 36.0 g/dL   RDW 16.2 (H) 11.5 - 15.5 %   Platelets 160 150 - 400 K/uL    Comment: REPEATED TO VERIFY  CBC with Differential/Platelet     Status: Abnormal   Collection Time: 12/30/14  7:44 AM  Result Value Ref Range   WBC 31.5 (H) 4.0 - 10.5 K/uL   RBC 2.57 (L) 4.22 - 5.81 MIL/uL   Hemoglobin 8.8 (L) 13.0 - 17.0 g/dL   HCT 24.7 (L) 39.0 - 52.0 %   MCV 96.1 78.0 - 100.0 fL   MCH 34.2 (H) 26.0 - 34.0 pg   MCHC 35.6 30.0 - 36.0 g/dL   RDW 16.4 (H) 11.5 - 15.5 %   Platelets 154 150 - 400 K/uL   Neutrophils Relative % 83 (H) 43 - 77 %   Lymphocytes Relative 5 (L) 12 - 46 %   Monocytes Relative 11 3 - 12 %   Eosinophils Relative 1 0 - 5 %   Basophils Relative 0 0 - 1 %   Neutro Abs 26.1 (H) 1.7 - 7.7 K/uL   Lymphs Abs 1.6 0.7 - 4.0 K/uL   Monocytes Absolute 3.5 (H) 0.1 - 1.0 K/uL   Eosinophils Absolute 0.3 0.0 - 0.7 K/uL   Basophils Absolute 0.0 0.0 - 0.1 K/uL   RBC Morphology BURR CELLS     Comment: ELLIPTOCYTES POLYCHROMASIA PRESENT   Ammonia     Status: Abnormal   Collection Time: 12/30/14  8:00 AM  Result Value Ref Range   Ammonia 43 (H) 11 - 32 umol/L  Glucose, capillary     Status: Abnormal   Collection Time: 12/30/14  8:17 AM  Result Value Ref Range   Glucose-Capillary 158 (H) 70 - 99 mg/dL  Lactic acid, plasma     Status: None   Collection Time: 12/30/14  9:00 AM  Result Value Ref Range    Lactic Acid, Venous 1.5 0.5 - 2.0 mmol/L  Troponin I (q 6hr x 3)     Status: None   Collection Time: 12/30/14  9:17 AM  Result Value Ref Range   Troponin I <0.03 <0.031 ng/mL    Comment:        NO INDICATION OF MYOCARDIAL INJURY.   Urinalysis, Routine w reflex microscopic     Status: Abnormal   Collection Time: 12/30/14 11:04 AM  Result Value Ref Range   Color, Urine AMBER (A) YELLOW    Comment: BIOCHEMICALS MAY BE AFFECTED BY COLOR   APPearance CLOUDY (A) CLEAR   Specific Gravity, Urine 1.022 1.005 - 1.030   pH 5.0 5.0 - 8.0   Glucose, UA NEGATIVE NEGATIVE mg/dL   Hgb urine dipstick NEGATIVE NEGATIVE   Bilirubin Urine NEGATIVE NEGATIVE   Ketones, ur NEGATIVE NEGATIVE mg/dL   Protein, ur NEGATIVE NEGATIVE mg/dL   Urobilinogen, UA 0.2 0.0 - 1.0 mg/dL   Nitrite NEGATIVE NEGATIVE   Leukocytes, UA NEGATIVE NEGATIVE    Comment: MICROSCOPIC NOT DONE ON URINES WITH NEGATIVE PROTEIN, BLOOD, LEUKOCYTES, NITRITE, OR GLUCOSE <1000 mg/dL.  I-STAT 3, arterial blood gas (G3+)     Status: Abnormal   Collection Time: 12/30/14  4:05 PM  Result Value Ref Range   pH, Arterial 7.214 (L) 7.350 - 7.450   pCO2 arterial 37.1 35.0 - 45.0  mmHg   pO2, Arterial 354.0 (H) 80.0 - 100.0 mmHg   Bicarbonate 15.0 (L) 20.0 - 24.0 mEq/L   TCO2 16 0 - 100 mmol/L   O2 Saturation 100.0 %   Acid-base deficit 12.0 (H) 0.0 - 2.0 mmol/L   Patient temperature 98.6 F    Collection site RADIAL, ALLEN'S TEST ACCEPTABLE    Drawn by Operator    Sample type ARTERIAL     Ct Abdomen Pelvis Wo Contrast  12/29/2014   CLINICAL DATA:  Status post bowel resection on 12/15/2014, status post laparotomy on 02/13 and 2/18, evaluate for intra-abdominal abscess or necrotic bowel  EXAM: CT ABDOMEN AND PELVIS WITHOUT CONTRAST  TECHNIQUE: Multidetector CT imaging of the abdomen and pelvis was performed following the standard protocol without IV contrast.  COMPARISON:  None.  FINDINGS: Motion degraded images.  Lower chest:  Lung bases are  essentially clear.  Hepatobiliary: Cirrhotic configuration of the liver.  Gallbladder is unremarkable. No intrahepatic or extrahepatic ductal dilatation.  Pancreas: Within normal limits.  Spleen: Within normal limits.  Adrenals/Urinary Tract: Adrenal glands are unremarkable.  Kidneys are within normal limits. No renal calculi or hydronephrosis.  Bladder is within normal limits.  Stomach/Bowel: Stomach is within normal limits.  Moderate duodenal diverticulum along the 1st/2nd portion of the duodenum (series 2/image 28).  No evidence of bowel obstruction.  Prior small bowel resection with suture lines in the right mid abdomen (series 2/image 65).  Wall thickening involving multiple segments of small bowel, including in the left lower abdomen (series 2/image 61 and 72) and beneath the right mid anterior abdominal wall (series 2/ image 57).  Wall thickening involving multiple segments of colon, including the cecum/right colon (series 2/image 50).  This generalized appearance of wall thickening is not overly concerning for ischemia. Additionally, there is no pneumatosis.  Vascular/Lymphatic: Atherosclerotic calcifications of the abdominal aorta and branch vessels, including mild calcification involving the proximal celiac artery (series 2/image 20) and moderate calcifications involving the SMA (series 2/ image 34). Vessel patency cannot be assessed on noncontrast examination.  SMV is mildly prominent while the portal vein is diminutive. There is no stranding in the fat surrounding the SMV.  4.1 x 4.2 cm infrarenal abdominal aortic aneurysm (series 2/image 48).  Reproductive: Prostate is unremarkable.  Other: Small volume abdominopelvic ascites.  Small volume pneumoperitoneum under the diaphragm (series 2/ image 20). Additional scattered foci free air beneath the anterior abdominal wall (for example, series 2/ image 44). This appearance could be related to recent laparotomy.  Postsurgical changes overlying the midline  anterior abdominal wall.  Musculoskeletal: Degenerative changes of the visualized thoracolumbar spine.  Mild to moderate compression fracture deformity involving the anterior aspect of the L1 vertebral body (sagittal image 96). Prominent Schmorl's node involving the superior endplate at L2.  IMPRESSION: No findings specific for bowel ischemia on this noncontrast examination. No pneumatosis.  Scattered areas of small bowel and colonic wall thickening, including the cecum/right colon.  No evidence of bowel obstruction.  No evidence of intra abdominal abscess.  Small volume pneumoperitoneum with scattered foci of free air beneath the anterior abdominal wall, likely related to recent laparotomy.  4.2 cm infrarenal abdominal aortic aneurysm.  Cirrhosis.  Additional ancillary findings as above.   Electronically Signed   By: Julian Hy M.D.   On: 12/29/2014 17:20   Dg Abd 1 View  12/29/2014   CLINICAL DATA:  Nasogastric tube placement.  EXAM: ABDOMEN - 1 VIEW  COMPARISON:  None.  FINDINGS: A nasogastric  tube is seen with tip in the distal esophagus near the gastroesophageal junction. Mild dilatation of small bowel and colon is seen likely due to ileus. Abdominal wall retention sutures noted.  IMPRESSION: Nasogastric tube tip overlies the distal esophagus near the GE junction. Mild ileus pattern.   Electronically Signed   By: Earle Gell M.D.   On: 12/29/2014 12:03   Dg Chest Port 1 View  12/30/2014   CLINICAL DATA:  Endotracheal tube placement. Attempted right central venous line placement.  EXAM: PORTABLE CHEST - 1 VIEW  COMPARISON:  One-view chest from the same day.  FINDINGS: Endotracheal tube terminates 2.4 cm above the carina. A left IJ line is stable. Heart is enlarged. Mild edema is stable. There is no pneumothorax. The visualized soft tissues and bony thorax are unremarkable.  IMPRESSION: 1. The endotracheal tube is somewhat low, 2.4 cm above the carina. 2. Stable left IJ line. 3. Mild cardiomegaly and  edema.   Electronically Signed   By: San Morelle M.D.   On: 12/30/2014 14:49   Dg Chest Port 1 View  12/30/2014   CLINICAL DATA:  Subsequent evaluation for fluid retention  EXAM: PORTABLE CHEST - 1 VIEW  COMPARISON:  12/29/2014  FINDINGS: Mild to moderate cardiac enlargement stable. Status post prior CABG. Left internal jugular central line stable.  Moderate vascular congestion. There are heel wall thickening. Mild interstitial prominence.  IMPRESSION: Congestive heart failure with mild interstitial pulmonary edema, slightly worse when compared to prior study.   Electronically Signed   By: Skipper Cliche M.D.   On: 12/30/2014 10:24   Dg Chest Port 1 View  12/29/2014   CLINICAL DATA:  64 year old male with acute shortness of Breath. Initial encounter.  EXAM: PORTABLE CHEST - 1 VIEW  COMPARISON:  12/24/2014 and earlier.  FINDINGS: Portable AP semi upright view at 0921 hours. Lower lung volumes. Stable cardiac size and mediastinal contours. Sequelae of CABG. Stable left IJ central line. No pneumothorax. No pleural effusion or consolidation. Stable pulmonary vascularity. No confluent pulmonary opacity. Increased gaseous distension of the stomach.  IMPRESSION: Lower lung volumes otherwise no acute cardiopulmonary abnormality.  Increased gaseous distension of bowel in the left upper abdomen.   Electronically Signed   By: Genevie Ann M.D.   On: 12/29/2014 09:40    ROS Blood pressure 108/22, pulse 88, temperature 97.9 F (36.6 C), temperature source Axillary, resp. rate 18, height 6' 1" (1.854 m), weight 105.1 kg (231 lb 11.3 oz), SpO2 100 %. Physical Exam Physical Examination: General appearance - on vent, pale Mental status - sedated, not responsive ,moves to pain and spont head movements Eyes - pupils pinpoint and not reactive Mouth - mucous membranes moist, pharynx normal without lesions Lymphatics - posterior cervical nodes Chest - decreased air entry noted bilat, rhonchi noted bilat Heart - S1  and S2 normal, systolic murmur Gr 2/6 at apex Abdomen - mod distension . Long midline incision, with collection bag over.  Yellowish fluid draining Neurological - see above Extremities - pedal edema tr  Skin - pale, some bruises  Assessment/Plan: 1 CKD 3 and AKI  Underlying dz , suspect vasc vs HTN.  Now with evidence hemodynamic injury,, r/o inflam ie AIN, vs obstruction. Need to fluid challenge and give bicarb.  Low SNa indic of hypovolemia and hypotonic fluids, ie TNA and po.  Need to give Na as isotonic admin 2 Hyponatremia see above 3 Hypertension:not an issue 4. Ischemic bowel 5. Metabolic Bone Disease: check PTH 6 Malnutrition 7  VDRF 8 Enceph multifact 9 low bp  Vol r/o sepsis P high vol isotonic fluids,  Check urine indices, U/S,  UA  follow  Lorel Lembo L 12/30/2014, 4:57 PM

## 2014-12-30 NOTE — Progress Notes (Signed)
PULMONARY / CRITICAL CARE MEDICINE   Name: Kyle West MRN: [id number] DOB: [birth year]    ADMISSION DATE:  08-Jan-2015   REFERRING MD :  OSH  CHIEF COMPLAINT:  abd pain   INITIAL PRESENTATION:  64yo male with hx HTN, CAD s/p CABG, cirrhosis of unclear etiology (followed at [facility]), reported recent hx pancreatitis.  Presented to OSH 2/11 with 2 day hx worsening abd pain, nausea and vomiting.  CT at outside hospital revealed pneumatosis, hepatic and portal venous gas and infrarenal AAA.  He was tx to [facility] and taken to OR 2/11 for exp lap r/t ischemic bowel/SBO.  Course c/b Afib with RVR and abd wound dehiscence on 2/18 with return to OR.  On 2/25 developed AMS, worsening hypotension and acidosis and PCCM re-consulted.   MAJOR EVENTS: 2/11 exploratory laparotomy, small bowel resection, application of wound vac (Dr Luisa Hart). Left intubated post op. Post op hypotension requiring CVL placement, volume resuscitation, vasopressors 2/12 sinus tachycardia, new onset AFRVR. Vasopressors changed from NE to PE. Low dose metoprolol initiated 2/13 back to OR, side-to-side small bowel anastomosis & closure of fascia.  Persistent AFib with RVR. Levo added. 2/15 extubated, off pressors 2/16 d/c amiodarone 2/18 Abd wound dehiscence, Return to OR  2/25 AMS, acidotic, hypotensive, tx ICU and PCCM re consulted.    TESTS: 2/11 CT abd/pelvis >> pneumatosis SB with moderate ascites, infrarenal AAA, cirrhosis   SUBJECTIVE:  Asked to re-consult r/t hypotension, AMS, acidosis.  Lethargic, on bipap.     VITAL SIGNS: Temp:  [98.3 F (36.8 C)-99 F (37.2 C)] 98.9 F (37.2 C) (02/25 0506) Pulse Rate:  [83-98] 87 (02/25 0630) Resp:  [18-32] 24 (02/25 0630) BP: (64-147)/(22-130) 81/30 mmHg (02/25 0630) SpO2:  [96 %-100 %] 100 % (02/25 0630) Weight:  [231 lb 11.3 oz (105.1 kg)-236 lb 12.4 oz (107.4 kg)] 231 lb 11.3 oz (105.1 kg) (02/25 0431) INTAKE / OUTPUT:  Intake/Output Summary (Last 24 hours) at  12/30/14 0936 Last data filed at 12/30/14 0630  Gross per 24 hour  Intake 4553.75 ml  Output   3800 ml  Net 753.75 ml    PHYSICAL EXAMINATION: General: chronically ill appearing male, NAD  Neuro:  lethargic, arousable, follows simple commands intermittently, MAE, weak  HEENT: NG tube in place, JVD  Cardiovascular: regular Lungs: resps even, non labored on bipap, scattered rhonchi Abdomen:  Soft, non distended, midline wound with retention sutures and eakins pouch with serosanguinous drainage  Ext: no edema  LABS:  CBC  Recent Labs Lab 12/29/14 0601 12/30/14 0730 12/30/14 0744  WBC 25.0* 30.3* 31.5*  HGB 9.6* 8.2* 8.8*  HCT 28.1* 23.4* 24.7*  PLT 181 160 154   Coag's No results for input(s): APTT, INR in the last 168 hours. BMET  Recent Labs Lab 12/28/14 0425 12/29/14 0601 12/30/14 0430  NA 128* 124* 122*  K 4.2 4.0 3.7  CL 106 105 99  CO2 17* 13* 14*  BUN 73* 72* 93*  CREATININE 1.51* 1.58* 2.28*  GLUCOSE 145* 148* 179*   Electrolytes  Recent Labs Lab 12/28/14 0425 12/29/14 0601 12/30/14 0430  CALCIUM 7.0* 7.0* 7.1*  MG 2.5 2.2 1.9  PHOS 3.9 4.7* 5.6*   Sepsis Markers  Recent Labs Lab 12/25/14 1100  12/25/14 1310 12/27/14 0437 12/28/14 0425 12/29/14 0601 12/29/14 0924  LATICACIDVEN  --   < > 1.7  --  1.0  --  1.3  PROCALCITON 9.58  --   --  10.29  --  5.71  --   < > =  values in this interval not displayed.   ABG  Recent Labs Lab 12/25/14 1645 12/29/14 1028 12/30/14 0655  PHART 7.358 7.321* 7.322*  PCO2ART 31.2* 21.7* 24.1*  PO2ART 82.0 74.0* 88.3   Liver Enzymes  Recent Labs Lab 12/27/14 0437 12/29/14 1000 12/30/14 0430  AST 71* 62* 72*  ALT 30 29 34  ALKPHOS 112 121* 103  BILITOT 2.8* 2.6* 2.3*  ALBUMIN 1.9* 1.7* 1.4*   Cardiac Enzymes  Recent Labs Lab 12/29/14 1000  TROPONINI <0.03   Glucose  Recent Labs Lab 12/28/14 2108 12/29/14 0805 12/29/14 1242 12/29/14 1803 12/29/14 2323 12/30/14 0817  GLUCAP  157* 157* 152* 151* 179* 158*     ASSESSMENT / PLAN:  PULMONARY ETT 2/11 >> 2/15 A: Acute respiratory failure - r/t sepsis +/- hepatic encephalopathy + metabolic acidosis Hx of OSA. P:   Oxygen to keep SaO2 > 92% Bronchial hygiene F/u ABG CXR now  Tenuous respiratory status - high risk for intubation    CARDIOVASCULAR L IJ CVL 2/11 >>  A line 2/11 > 2/16 A: Shock - recurrent.  ?new source sepsis PAF with RVR -- converted to NSR 2/13. AAA - nondissecting. Hx of CAD s/p CABG, HTN, HLD. P:  Cont low dose Metoprolol - had tachycardia likely r/t BB withdrawal previously  Even fluid balance Check CVP  Change CVL 2/25 Trend troponin  Consider echo  Gentle volume  Add neo gtt to keep MAP >65  RENAL A:   AKI  Lactic acidosis  Hyponatremia  P:   F/u chem  F/u acidosis  Gentle volume  Consider renal involvement, may need CRRT; check BMP at 1400 2/25   GASTROINTESTINAL A:   Post laparotomy with bowel resection, repeat OR for fascial closure 2/13, wound dehiscence 2/18 Hx of alcoholic/NASH cirrhosis >> [facility]. Diarrhea >> C diff negative 2/16. P:   SUP: IV pantoprazole TPN, wound care per surgery  Hold outpt aldactone, chronulac, xifaxan for now F/u LFT's    HEMATOLOGIC A:   Anemia, thrombocytopenia of critical illness and chronic disease. P:  SQ heparin for DVT prevention F/u CBC   INFECTIOUS A:   Septic shock 2nd to peritonitis. New sepsis 2/25 -- doubt abd source with benign abd exam, CT abd 2/24 without evidence ischemia, abscesses or recurrent obstruction.   P:   CDiff 2/16>>> Urine 2/20>>>neg BCx2 2/25>>> Urine 2/25>>> Zosyn 2/20>>> vanc 2/25>>>  Pan culture  D/w Dr wyatt who will review CT abd images again, no readily apparent abscess reported D/c old CVL and replace  Add vanco 2/25   ENDOCRINE A:   Stress induced hyperglycemia P:   Continue SSI while on TPN   NEUROLOGIC A:   Hx of depression. Post-op pain  control. Deconditioning. P:   PRN fentanyl Resume lactulose when able  F/u ammonia    Dirk DressKaty Whiteheart, NP 12/30/2014  9:36 AM Pager: [phone] or [phone]  Attending Note:  I have examined patient, reviewed labs, studies and notes. I have discussed the case with Jasper RilingK Whiteheart, and I agree with the data and plans as amended above. Pt has progressive metabolic acidosis, slight worsening pulm infiltrates and acute respiratory failure. He has used his CPAP, appears to be compensating for his acidosis at this time but is at high risk for decline. His renal failure and possible sepsis are driving metabolic acidosis. On exam he is awake on CPAP, appears weak, has B rhonchi. Agree he is at risk for intubation. Will change his CVC, add vanco.  Dr Lindie Spruce will over-read his CT abd to insure no infectious focus. Will follow renal status closely - may need to consult renal regarding HD if he progresses.  Independent critical care time is 50 minutes.   Levy Pupa, MD, PhD 12/30/2014, 11:18 AM [facility] [phone] or if no answer [phone]

## 2014-12-30 NOTE — Progress Notes (Signed)
Page  Ralph LeydenKathy Schorr NP for [facility]  regarding patient's increase in agitation, pulling off EKG, Cpap, other monitoring devices. Patient attempting to get OOB, bed exit alarm in place with bed in low position. Attempted to redirect patient and orient patient to place, date, and time.  Mittens applied to both hands . Artist BeachKaren Schorr NP made ware of patient's current assessment, orders, lab values and nursing interventions attempted. Orders received . Cpap placed back on patient since he removed it . Also made aware that this patient is a stepdown patient.

## 2014-12-31 LAB — BASIC METABOLIC PANEL
ANION GAP: 6 (ref 5–15)
ANION GAP: 10 (ref 5–15)
Anion gap: 7 (ref 5–15)
BUN: 98 mg/dL — AB (ref 6–23)
BUN: 94 mg/dL — ABNORMAL HIGH (ref 6–23)
BUN: 93 mg/dL — AB (ref 6–23)
CALCIUM: 6 mg/dL — AB (ref 8.4–10.5)
CHLORIDE: 87 mmol/L — AB (ref 96–112)
CO2: 21 mmol/L (ref 19–32)
CO2: 20 mmol/L (ref 19–32)
CO2: 22 mmol/L (ref 19–32)
CREATININE: 2.31 mg/dL — AB (ref 0.50–1.35)
CREATININE: 2.18 mg/dL — AB (ref 0.50–1.35)
Calcium: 6.5 mg/dL — ABNORMAL LOW (ref 8.4–10.5)
Calcium: 6.7 mg/dL — ABNORMAL LOW (ref 8.4–10.5)
Chloride: 87 mmol/L — ABNORMAL LOW (ref 96–112)
Chloride: 88 mmol/L — ABNORMAL LOW (ref 96–112)
Creatinine, Ser: 2.29 mg/dL — ABNORMAL HIGH (ref 0.50–1.35)
GFR calc Af Amer: 33 mL/min — ABNORMAL LOW (ref >90)
GFR calc non Af Amer: 28 mL/min — ABNORMAL LOW (ref >90)
GFR calc non Af Amer: 30 mL/min — ABNORMAL LOW (ref >90)
GFR, EST AFRICAN AMERICAN: 33 mL/min — AB (ref >90)
GFR, EST AFRICAN AMERICAN: 35 mL/min — AB (ref >90)
GFR, EST NON AFRICAN AMERICAN: 28 mL/min — AB (ref >90)
GLUCOSE: 199 mg/dL — AB (ref 70–99)
GLUCOSE: 227 mg/dL — AB (ref 70–99)
Glucose, Bld: 123 mg/dL — ABNORMAL HIGH (ref 70–99)
POTASSIUM: 3.2 mmol/L — AB (ref 3.5–5.1)
POTASSIUM: 3.5 mmol/L (ref 3.5–5.1)
Potassium: 3.4 mmol/L — ABNORMAL LOW (ref 3.5–5.1)
Sodium: 114 mmol/L — CL (ref 135–145)
Sodium: 118 mmol/L — CL (ref 135–145)
Sodium: 116 mmol/L — CL (ref 135–145)

## 2014-12-31 LAB — CBC
HCT: 23.7 % — ABNORMAL LOW (ref 39.0–52.0)
Hemoglobin: 8.6 g/dL — ABNORMAL LOW (ref 13.0–17.0)
MCH: 33.6 pg (ref 26.0–34.0)
MCHC: 36.3 g/dL — AB (ref 30.0–36.0)
MCV: 92.6 fL (ref 78.0–100.0)
PLATELETS: 236 10*3/uL (ref 150–400)
RBC: 2.56 MIL/uL — ABNORMAL LOW (ref 4.22–5.81)
RDW: 16.2 % — ABNORMAL HIGH (ref 11.5–15.5)
WBC: 41.4 10*3/uL — ABNORMAL HIGH (ref 4.0–10.5)

## 2014-12-31 LAB — POCT I-STAT 3, ART BLOOD GAS (G3+)
Acid-base deficit: 6 mmol/L — ABNORMAL HIGH (ref 0.0–2.0)
BICARBONATE: 19.6 meq/L — AB (ref 20.0–24.0)
O2 Saturation: 98 %
PH ART: 7.34 — AB (ref 7.350–7.450)
TCO2: 21 mmol/L (ref 0–100)
pCO2 arterial: 36.4 mmHg (ref 35.0–45.0)
pO2, Arterial: 105 mmHg — ABNORMAL HIGH (ref 80.0–100.0)

## 2014-12-31 LAB — COMPREHENSIVE METABOLIC PANEL
ALK PHOS: 111 U/L (ref 39–117)
ALT: 53 U/L (ref 0–53)
ANION GAP: 11 (ref 5–15)
AST: 130 U/L — ABNORMAL HIGH (ref 0–37)
Albumin: 1.3 g/dL — ABNORMAL LOW (ref 3.5–5.2)
BILIRUBIN TOTAL: 2.3 mg/dL — AB (ref 0.3–1.2)
BUN: 95 mg/dL — ABNORMAL HIGH (ref 6–23)
CHLORIDE: 88 mmol/L — AB (ref 96–112)
CO2: 18 mmol/L — ABNORMAL LOW (ref 19–32)
Calcium: 6.4 mg/dL — CL (ref 8.4–10.5)
Creatinine, Ser: 2.27 mg/dL — ABNORMAL HIGH (ref 0.50–1.35)
GFR calc Af Amer: 33 mL/min — ABNORMAL LOW (ref >90)
GFR calc non Af Amer: 29 mL/min — ABNORMAL LOW (ref >90)
GLUCOSE: 195 mg/dL — AB (ref 70–99)
POTASSIUM: 3.1 mmol/L — AB (ref 3.5–5.1)
SODIUM: 117 mmol/L — AB (ref 135–145)
Total Protein: 3.8 g/dL — ABNORMAL LOW (ref 6.0–8.3)

## 2014-12-31 LAB — CULTURE, BLOOD (ROUTINE X 2)
CULTURE: NO GROWTH
CULTURE: NO GROWTH

## 2014-12-31 LAB — GLUCOSE, CAPILLARY
Glucose-Capillary: 198 mg/dL — ABNORMAL HIGH (ref 70–99)
Glucose-Capillary: 120 mg/dL — ABNORMAL HIGH (ref 70–99)
Glucose-Capillary: 166 mg/dL — ABNORMAL HIGH (ref 70–99)

## 2014-12-31 LAB — IRON AND TIBC
Iron: 18 ug/dL — ABNORMAL LOW (ref 42–165)
Saturation Ratios: 11 % — ABNORMAL LOW (ref 20–55)
TIBC: 157 ug/dL — AB (ref 215–435)
UIBC: 139 ug/dL (ref 125–400)

## 2014-12-31 LAB — MAGNESIUM: MAGNESIUM: 1.6 mg/dL (ref 1.5–2.5)

## 2014-12-31 LAB — PHOSPHORUS: PHOSPHORUS: 5.3 mg/dL — AB (ref 2.3–4.6)

## 2014-12-31 MED ORDER — POTASSIUM CHLORIDE 10 MEQ/100ML IV SOLN
10.0000 meq | INTRAVENOUS | Status: AC
Start: 2014-12-31 — End: 2014-12-31
  Administered 2014-12-31 (×4): 10 meq via INTRAVENOUS
  Filled 2014-12-31 (×4): qty 100

## 2014-12-31 MED ORDER — MAGNESIUM SULFATE 2 GM/50ML IV SOLN
2.0000 g | Freq: Once | INTRAVENOUS | Status: DC
Start: 2014-12-31 — End: 2014-12-31

## 2014-12-31 MED ORDER — IPRATROPIUM-ALBUTEROL 0.5-2.5 (3) MG/3ML IN SOLN
3.0000 mL | Freq: Four times a day (QID) | RESPIRATORY_TRACT | Status: DC
  Administered 2014-12-31 – 2015-01-08 (×31): 3 mL via RESPIRATORY_TRACT
  Filled 2014-12-31 (×31): qty 3

## 2014-12-31 MED ORDER — TRACE MINERALS CR-CU-F-FE-I-MN-MO-SE-ZN IV SOLN
INTRAVENOUS | Status: DC
  Filled 2014-12-31: qty 2640

## 2014-12-31 MED ORDER — INSULIN ASPART 100 UNIT/ML ~~LOC~~ SOLN
0.0000 [IU] | SUBCUTANEOUS | Status: DC
  Administered 2015-01-01 (×2): 2 [IU] via SUBCUTANEOUS
  Administered 2015-01-01 (×2): 3 [IU] via SUBCUTANEOUS
  Administered 2015-01-01: 2 [IU] via SUBCUTANEOUS
  Administered 2015-01-01 – 2015-01-02 (×2): 3 [IU] via SUBCUTANEOUS
  Administered 2015-01-02: 2 [IU] via SUBCUTANEOUS
  Administered 2015-01-02: 3 [IU] via SUBCUTANEOUS
  Administered 2015-01-02 (×2): 2 [IU] via SUBCUTANEOUS
  Administered 2015-01-02 – 2015-01-03 (×2): 3 [IU] via SUBCUTANEOUS
  Administered 2015-01-03 (×2): 2 [IU] via SUBCUTANEOUS

## 2014-12-31 MED ORDER — FAT EMULSION 20 % IV EMUL
240.0000 mL | INTRAVENOUS | Status: AC
  Administered 2014-12-31: 240 mL via INTRAVENOUS
  Filled 2014-12-31: qty 250

## 2014-12-31 MED ORDER — STERILE WATER FOR INJECTION IV SOLN
INTRAVENOUS | Status: DC
  Administered 2014-12-31 – 2015-01-01 (×3): via INTRAVENOUS
  Filled 2014-12-31 (×3): qty 850

## 2014-12-31 MED ORDER — SODIUM CHLORIDE 3 % IV SOLN
INTRAVENOUS | Status: DC
  Administered 2015-01-01: 40 mL/h via INTRAVENOUS
  Filled 2014-12-31 (×2): qty 500

## 2014-12-31 MED ORDER — TRACE MINERALS CR-CU-F-FE-I-MN-MO-SE-ZN IV SOLN
INTRAVENOUS | Status: AC
  Administered 2014-12-31: 17:00:00 via INTRAVENOUS
  Filled 2014-12-31: qty 2640

## 2014-12-31 MED ORDER — FAT EMULSION 20 % IV EMUL
240.0000 mL | INTRAVENOUS | Status: DC
  Filled 2014-12-31: qty 250

## 2014-12-31 MED ORDER — SODIUM CHLORIDE 3 % IV SOLN
INTRAVENOUS | Status: AC
  Administered 2014-12-31: 40 mL/h via INTRAVENOUS
  Filled 2014-12-31: qty 500

## 2014-12-31 NOTE — Progress Notes (Addendum)
PARENTERAL NUTRITION CONSULT NOTE - FOLLOW UP  Pharmacy Consult for TPN Indication: Prolonged ileus  Allergies  Allergen Reactions  . Oxycodone Hives and Other (See Comments)    Hallucinations   Patient Measurements: Height:  (185.4 cm) Weight: 244 lb 4.3 oz (110.8 kg) IBW/kg (Calculated) : 79.9  Vital Signs: Temp: 98.7 F (37.1 C) (02/26 0800) Temp Source: Oral (02/26 0800) BP: 177/34 mmHg (02/26 0800) Pulse Rate: 90 (02/26 0800) Intake/Output from previous day: 02/25 0701 - 02/26 0700 In: 6445.4 [I.V.:4065.4; IV Piggyback:100; TPN:2280] Out: 2850 [Urine:1400; Stool:1450] Intake/Output from this shift: Total I/O In: 1295.7 [I.V.:335.7; TPN:960] Out: 150 [Urine:150]  Labs:  Recent Labs  12/30/14 0744 12/30/14 1915 12/31/14 0520  WBC 31.5* 43.9* 41.4*  HGB 8.8* 8.4* 8.6*  HCT 24.7* 23.3* 23.7*  PLT 154 220 236    Recent Labs  12/29/14 0601  12/29/14 1000 12/30/14 0430 12/30/14 1400 12/30/14 1855 12/30/14 1915 12/31/14 0520  NA 124*  --   --  122* 120*  --  120* 117*  K 4.0  --   --  3.7 3.6  --  3.5 3.1*  CL 105  --   --  99 97  --  97 88*  CO2 13*  --   --  14* 13*  --  11* 18*  GLUCOSE 148*  --   --  179* 152*  --  147* 195*  BUN 72*  --   --  93* 95*  --  95* 95*  CREATININE 1.58*  --   --  2.28* 2.28*  --  2.28* 2.27*  LABCREA  --   --   --   --   --  99.86  --   --   CALCIUM 7.0*  --   --  7.1* 6.6*  --  6.6* 6.4*  MG 2.2  --   --  1.9  --   --   --  1.6  PHOS 4.7*  --   --  5.6*  --   --  5.8* 5.3*  PROT  --   < > 4.4* 3.8*  --   --  3.5* 3.8*  ALBUMIN  --   < > 1.7* 1.4*  --   --  1.3* 1.3*  AST  --   < > 62* 72*  --   --  90* 130*  ALT  --   < > 29 34  --   --  39 53  ALKPHOS  --   < > 121* 103  --   --  109 111  BILITOT  --   < > 2.6* 2.3*  --   --  2.0* 2.3*  BILIDIR  --   --  1.1*  --   --   --   --   --   IBILI  --   --  1.5*  --   --   --   --   --   < > = values in this interval not displayed. Estimated Creatinine Clearance:  42.9 mL/min (by C-G formula based on Cr of 2.27).    Recent Labs  12/30/14 1703 12/30/14 2200 12/31/14 0733  GLUCAP 148* 169* 198*   Insulin Requirements in the past 24 hours:  5 units sensitive SSI, 12 units insulin in TPN  Nutritional Goals: (per RD note on 2/22) 2200-2400 kcal, 120-135g of protein per day  Current Nutrition:  -Clinimix 5/15 at 169ml/hr and Lipids at 29ml/hr, providing 132g protein and 2354  kCal (meets 100% of goals) -Ensure complete BID- each provides 350kCal and 13g protein- ordered, has not had any; changed back to NPO yesterday.  Admit:  [name] transferred from outside hospital for concern of ischemic bowel. He underwent surgery on 2/11 - exploratory laparotomy, small bowel resection and application of a wound vac. TPN was started in anticipation of prolonged bowel rest/ileus.   GI:  S/P ex. Lap with SBR on 2/11. Also history of pancreatitis and cirrhosis child pugh B. QMV:[id number]:Lactulose, PPI, Rifaximin, spironolactione, Ursodiol. S/P side-to-side small bowel anastomosis and closure of abdomen on 2/13.Continues to have ascites from wound- wound in tact with Eakin pouch to contain drainage- serous, blood tinged. +BS. Baseline prealbumin low at 6.4, this week it still remains low at 3.5. Patient decompensated yesterday and calorie count and diet were stopped.  Endo: No history of diabetes. CBGs 147-195- has 12 units (receiving ~10.5 units) regular insulin in TPN bag along with SSI ordered.  CBG's less than 200, will continue current regimen.  Lytes: Na dropped to 122 continues down (117), K 3.1 (supplementing with K-runs), Mg 1.6, Phos dec. 5.3, CorCa 9 (CorCa x phos product ~48).  Sodium bicarb was resumed at 18000ml/hr - follow closely.  Renal: AKI s/p surgery which was resolving. Now SCr bumped again-1.58>2.27, UOP at 0.5 ml/kg/hr in last 24h. Fluid challenge ongoing and patient is +3.5L.  Still with 1.5L peritoneal output  Pulm:CPAP  Cards: History of  CAD. BP elevated, HR wnl. Metoprolol IV scheduled. Did have episode of PAF after surgery which has since resolved.  Hepatobil: history of cirrhosis/NASH.alkp phos nml, AST trending back up (130),  ALT remains wnl, Tbili down at 2.3. INR 1.67 (2/13). TG 56.  Ammonia 27 (2/19; improved on lactulose- now stopped)  Neuro: Now with post anesthesia hepatic encephalopathy. PRN Haldol  ID: WBC up (41K) - Zosyn for intra-abdominal coverage (2/11>>), vanc back for sepsis of unknown source (2/20>>2/23; 2/25>>). Afebrile, PCT 5.7, LA 1.3. CDiff(-) 2/16, re-checked 2/24 and was negative as well.  Best Practices: SCDs, mouthcare, Hep SQ, PPI   TPN Access: Triple Lumen IJ (placed 2/11) TPN day#:14; (12/17/14 >> )  Plan:  -give Magnesium sulfate 2g IV x1 as repletion to ensure labs stay WNL -Continue Clinimix NO LYTES 5/15 at 17110mL/hr with 20% IVFE at 5310mL/hr. This is providing 100% of patient's goals -Daily MVI and TE in TPN -12 units regular insulin in TPN bag (patient will receive ~10.5 units) -Continue SSI -MIVF as per MD orders  -F/U BMET with am labs   Nadara MustardNita Lamanda Rudder, PharmD., MS Clinical Pharmacist Pager:  [phone] Thank you for allowing pharmacy to be part of this patients care team. 12/31/2014 9:28 AM    Addendum: Spoke with Dr. Lowell GuitarPowell, will change TPN to include electrolytes.  These are standard and we can again remove them should lytes trend higher.  New regimen will be: Clinimix 5/15 with electrolytes at 110 ml/hr with ongoing Lipids to provide 100% support.  Nadara MustardNita Clotee Schlicker, PharmD., MS Clinical Pharmacist Pager:  [phone]

## 2014-12-31 NOTE — Progress Notes (Signed)
Patient ID: Kyle West, male   DOB: Aug 06, 1951, 64 y.o.   MRN: [id number]     [facility] [address] [place], [place] [address]    Phone: [phone] FAX: [phone]     Subjective: Pt intubated.  Vanco started.  WBC trending down today.  41.4k.  sCr stable, worsening Na, K.  UA negative, BCs negative to date.  CT of abdomen negative for abscess, not abdominal source.    Objective:  Vital signs:  Filed Vitals:   12/31/14 0500 12/31/14 0600 12/31/14 0700 12/31/14 0728  BP: 149/27 167/30 183/43   Pulse: 85 90 108   Temp:      TempSrc:      Resp: 16 16 18    Height:      Weight:      SpO2: 100% 100% 100% 100%    Last BM Date: 12/30/14  Intake/Output   Yesterday:  02/25 0701 - 02/26 0700 In: 6445.4 [I.V.:4065.4; IV Piggyback:100; TPN:2280] Out: 2850 [Urine:1400; RVIFB:3794] This shift:    I/O last 3 completed shifts: In: 9190.4 [I.V.:4890.4; IV Piggyback:700] Out: 3500 [Urine:1400; Stool:2100] Total I/O In: 1063.8 [I.V.:223.8; TPN:840] Out: 75 [Urine:75]   Physical Exam: General: non responsive.  Abdomen: Soft. Nondistended. Non tender. Midline wound with retention sutures in place. Eakin's pouch with serous, output. No evidence of peritonitis. No incarcerated hernias.   Problem List:   Active Problems:   Abdominal pain   Ischemic bowel disease   Acute respiratory failure   Sepsis   AKI (acute kidney injury)   Peritonitis   Acute respiratory failure, unspecified whether with hypoxia or hypercapnia   SOB (shortness of breath)    Results:   Labs: Results for orders placed or performed during the hospital encounter of 12/14/2014 (from the past 48 hour(s))  Lactic acid, plasma     Status: None   Collection Time: 12/29/14  9:24 AM  Result Value Ref Range   Lactic Acid, Venous 1.3 0.5 - 2.0 mmol/L  Hepatic function panel     Status: Abnormal   Collection Time: 12/29/14 10:00 AM  Result  Value Ref Range   Total Protein 4.4 (L) 6.0 - 8.3 g/dL   Albumin 1.7 (L) 3.5 - 5.2 g/dL   AST 62 (H) 0 - 37 U/L   ALT 29 0 - 53 U/L   Alkaline Phosphatase 121 (H) 39 - 117 U/L   Total Bilirubin 2.6 (H) 0.3 - 1.2 mg/dL   Bilirubin, Direct 1.1 (H) 0.0 - 0.5 mg/dL   Indirect Bilirubin 1.5 (H) 0.3 - 0.9 mg/dL  Troponin I     Status: None   Collection Time: 12/29/14 10:00 AM  Result Value Ref Range   Troponin I <0.03 <0.031 ng/mL    Comment:        NO INDICATION OF MYOCARDIAL INJURY.   Blood gas, arterial     Status: Abnormal   Collection Time: 12/29/14 10:28 AM  Result Value Ref Range   FIO2 0.21 %   pH, Arterial 7.321 (L) 7.350 - 7.450   pCO2 arterial 21.7 (L) 35.0 - 45.0 mmHg   pO2, Arterial 74.0 (L) 80.0 - 100.0 mmHg   Bicarbonate 10.9 (L) 20.0 - 24.0 mEq/L   TCO2 11.6 0 - 100 mmol/L   Acid-base deficit 14.1 (H) 0.0 - 2.0 mmol/L   O2 Saturation 93.8 %   Patient temperature 98.6    Collection site RIGHT RADIAL    Drawn by  [phone]    Sample type ARTERIAL DRAW    Allens test (pass/fail) PASS PASS  Glucose, capillary     Status: Abnormal   Collection Time: 12/29/14 12:42 PM  Result Value Ref Range   Glucose-Capillary 152 (H) 70 - 99 mg/dL   Comment 1 Capillary Specimen   MRSA PCR Screening     Status: None   Collection Time: 12/29/14  1:07 PM  Result Value Ref Range   MRSA by PCR NEGATIVE NEGATIVE    Comment:        The GeneXpert MRSA Assay (FDA approved for NASAL specimens only), is one component of a comprehensive MRSA colonization surveillance program. It is not intended to diagnose MRSA infection nor to guide or monitor treatment for MRSA infections.   Clostridium Difficile by PCR     Status: None   Collection Time: 12/29/14  4:08 PM  Result Value Ref Range   C difficile by pcr NEGATIVE NEGATIVE  Glucose, capillary     Status: Abnormal   Collection Time: 12/29/14  6:03 PM  Result Value Ref Range   Glucose-Capillary 151 (H) 70 - 99 mg/dL   Comment 1 Capillary  Specimen   Glucose, capillary     Status: Abnormal   Collection Time: 12/29/14 11:23 PM  Result Value Ref Range   Glucose-Capillary 179 (H) 70 - 99 mg/dL  Magnesium     Status: None   Collection Time: 12/30/14  4:30 AM  Result Value Ref Range   Magnesium 1.9 1.5 - 2.5 mg/dL  Phosphorus     Status: Abnormal   Collection Time: 12/30/14  4:30 AM  Result Value Ref Range   Phosphorus 5.6 (H) 2.3 - 4.6 mg/dL  Comprehensive metabolic panel     Status: Abnormal   Collection Time: 12/30/14  4:30 AM  Result Value Ref Range   Sodium 122 (L) 135 - 145 mmol/L   Potassium 3.7 3.5 - 5.1 mmol/L   Chloride 99 96 - 112 mmol/L   CO2 14 (L) 19 - 32 mmol/L   Glucose, Bld 179 (H) 70 - 99 mg/dL   BUN 93 (H) 6 - 23 mg/dL   Creatinine, Ser 2.28 (H) 0.50 - 1.35 mg/dL   Calcium 7.1 (L) 8.4 - 10.5 mg/dL   Total Protein 3.8 (L) 6.0 - 8.3 g/dL   Albumin 1.4 (L) 3.5 - 5.2 g/dL   AST 72 (H) 0 - 37 U/L   ALT 34 0 - 53 U/L   Alkaline Phosphatase 103 39 - 117 U/L   Total Bilirubin 2.3 (H) 0.3 - 1.2 mg/dL   GFR calc non Af Amer 29 (L) >90 mL/min   GFR calc Af Amer 33 (L) >90 mL/min    Comment: (NOTE) The eGFR has been calculated using the CKD EPI equation. This calculation has not been validated in all clinical situations. eGFR's persistently <90 mL/min signify possible Chronic Kidney Disease.    Anion gap 9 5 - 15  Blood gas, arterial     Status: Abnormal   Collection Time: 12/30/14  6:55 AM  Result Value Ref Range   O2 Content 2.0 L/min   Delivery systems NASAL CONTINUOUS POSITIVE AIRWAY PRESSURE    pH, Arterial 7.322 (L) 7.350 - 7.450   pCO2 arterial 24.1 (L) 35.0 - 45.0 mmHg   pO2, Arterial 88.3 80.0 - 100.0 mmHg   Bicarbonate 12.1 (L) 20.0 - 24.0 mEq/L   TCO2 12.8 0 - 100 mmol/L   Acid-base deficit 12.8 (H) 0.0 - 2.0 mmol/L  O2 Saturation 95.8 %   Patient temperature 98.6    Collection site RIGHT RADIAL    Drawn by [phone]    Sample type ARTERIAL DRAW    Allens test (pass/fail) PASS PASS   CBC     Status: Abnormal   Collection Time: 12/30/14  7:30 AM  Result Value Ref Range   WBC 30.3 (H) 4.0 - 10.5 K/uL    Comment: REPEATED TO VERIFY   RBC 2.49 (L) 4.22 - 5.81 MIL/uL   Hemoglobin 8.2 (L) 13.0 - 17.0 g/dL    Comment: REPEATED TO VERIFY   HCT 23.4 (L) 39.0 - 52.0 %   MCV 94.0 78.0 - 100.0 fL   MCH 32.9 26.0 - 34.0 pg   MCHC 35.0 30.0 - 36.0 g/dL   RDW 16.2 (H) 11.5 - 15.5 %   Platelets 160 150 - 400 K/uL    Comment: REPEATED TO VERIFY  CBC with Differential/Platelet     Status: Abnormal   Collection Time: 12/30/14  7:44 AM  Result Value Ref Range   WBC 31.5 (H) 4.0 - 10.5 K/uL   RBC 2.57 (L) 4.22 - 5.81 MIL/uL   Hemoglobin 8.8 (L) 13.0 - 17.0 g/dL   HCT 24.7 (L) 39.0 - 52.0 %   MCV 96.1 78.0 - 100.0 fL   MCH 34.2 (H) 26.0 - 34.0 pg   MCHC 35.6 30.0 - 36.0 g/dL   RDW 16.4 (H) 11.5 - 15.5 %   Platelets 154 150 - 400 K/uL   Neutrophils Relative % 83 (H) 43 - 77 %   Lymphocytes Relative 5 (L) 12 - 46 %   Monocytes Relative 11 3 - 12 %   Eosinophils Relative 1 0 - 5 %   Basophils Relative 0 0 - 1 %   Neutro Abs 26.1 (H) 1.7 - 7.7 K/uL   Lymphs Abs 1.6 0.7 - 4.0 K/uL   Monocytes Absolute 3.5 (H) 0.1 - 1.0 K/uL   Eosinophils Absolute 0.3 0.0 - 0.7 K/uL   Basophils Absolute 0.0 0.0 - 0.1 K/uL   RBC Morphology BURR CELLS     Comment: ELLIPTOCYTES POLYCHROMASIA PRESENT   Ammonia     Status: Abnormal   Collection Time: 12/30/14  8:00 AM  Result Value Ref Range   Ammonia 43 (H) 11 - 32 umol/L  Glucose, capillary     Status: Abnormal   Collection Time: 12/30/14  8:17 AM  Result Value Ref Range   Glucose-Capillary 158 (H) 70 - 99 mg/dL  Lactic acid, plasma     Status: None   Collection Time: 12/30/14  9:00 AM  Result Value Ref Range   Lactic Acid, Venous 1.5 0.5 - 2.0 mmol/L  Troponin I (q 6hr x 3)     Status: None   Collection Time: 12/30/14  9:17 AM  Result Value Ref Range   Troponin I <0.03 <0.031 ng/mL    Comment:        NO INDICATION OF MYOCARDIAL  INJURY.   Culture, blood (routine x 2)     Status: None (Preliminary result)   Collection Time: 12/30/14 10:33 AM  Result Value Ref Range   Specimen Description BLOOD LEFT HAND    Special Requests BOTTLES DRAWN AEROBIC ONLY 5CC    Culture             BLOOD CULTURE RECEIVED NO GROWTH TO DATE CULTURE WILL BE HELD FOR 5 DAYS BEFORE ISSUING A FINAL NEGATIVE REPORT Performed at [facility]  Report Status PENDING   Culture, blood (routine x 2)     Status: None (Preliminary result)   Collection Time: 12/30/14 10:40 AM  Result Value Ref Range   Specimen Description BLOOD RIGHT HAND    Special Requests BOTTLES DRAWN AEROBIC ONLY 5CC    Culture             BLOOD CULTURE RECEIVED NO GROWTH TO DATE CULTURE WILL BE HELD FOR 5 DAYS BEFORE ISSUING A FINAL NEGATIVE REPORT Performed at [facility]    Report Status PENDING   Urinalysis, Routine w reflex microscopic     Status: Abnormal   Collection Time: 12/30/14 11:04 AM  Result Value Ref Range   Color, Urine AMBER (A) YELLOW    Comment: BIOCHEMICALS MAY BE AFFECTED BY COLOR   APPearance CLOUDY (A) CLEAR   Specific Gravity, Urine 1.022 1.005 - 1.030   pH 5.0 5.0 - 8.0   Glucose, UA NEGATIVE NEGATIVE mg/dL   Hgb urine dipstick NEGATIVE NEGATIVE   Bilirubin Urine NEGATIVE NEGATIVE   Ketones, ur NEGATIVE NEGATIVE mg/dL   Protein, ur NEGATIVE NEGATIVE mg/dL   Urobilinogen, UA 0.2 0.0 - 1.0 mg/dL   Nitrite NEGATIVE NEGATIVE   Leukocytes, UA NEGATIVE NEGATIVE    Comment: MICROSCOPIC NOT DONE ON URINES WITH NEGATIVE PROTEIN, BLOOD, LEUKOCYTES, NITRITE, OR GLUCOSE <1000 mg/dL.  Basic metabolic panel     Status: Abnormal   Collection Time: 12/30/14  2:00 PM  Result Value Ref Range   Sodium 120 (L) 135 - 145 mmol/L   Potassium 3.6 3.5 - 5.1 mmol/L   Chloride 97 96 - 112 mmol/L   CO2 13 (L) 19 - 32 mmol/L   Glucose, Bld 152 (H) 70 - 99 mg/dL   BUN 95 (H) 6 - 23 mg/dL   Creatinine, Ser 2.28 (H) 0.50 - 1.35 mg/dL   Calcium 6.6  (L) 8.4 - 10.5 mg/dL   GFR calc non Af Amer 29 (L) >90 mL/min   GFR calc Af Amer 33 (L) >90 mL/min    Comment: (NOTE) The eGFR has been calculated using the CKD EPI equation. This calculation has not been validated in all clinical situations. eGFR's persistently <90 mL/min signify possible Chronic Kidney Disease.    Anion gap 10 5 - 15  Troponin I     Status: Abnormal   Collection Time: 12/30/14  2:00 PM  Result Value Ref Range   Troponin I 0.06 (H) <0.031 ng/mL    Comment:        PERSISTENTLY INCREASED TROPONIN VALUES IN THE RANGE OF 0.04-0.49 ng/mL CAN BE SEEN IN:       -UNSTABLE ANGINA       -CONGESTIVE HEART FAILURE       -MYOCARDITIS       -CHEST TRAUMA       -ARRYHTHMIAS       -LATE PRESENTING MYOCARDIAL INFARCTION       -COPD   CLINICAL FOLLOW-UP RECOMMENDED.   I-STAT 3, arterial blood gas (G3+)     Status: Abnormal   Collection Time: 12/30/14  4:05 PM  Result Value Ref Range   pH, Arterial 7.214 (L) 7.350 - 7.450   pCO2 arterial 37.1 35.0 - 45.0 mmHg   pO2, Arterial 354.0 (H) 80.0 - 100.0 mmHg   Bicarbonate 15.0 (L) 20.0 - 24.0 mEq/L   TCO2 16 0 - 100 mmol/L   O2 Saturation 100.0 %   Acid-base deficit 12.0 (H) 0.0 - 2.0 mmol/L   Patient temperature 98.6  F    Collection site RADIAL, ALLEN'S TEST ACCEPTABLE    Drawn by Operator    Sample type ARTERIAL   Glucose, capillary     Status: Abnormal   Collection Time: 12/30/14  5:03 PM  Result Value Ref Range   Glucose-Capillary 148 (H) 70 - 99 mg/dL   Comment 1 Venous Specimen   Urinalysis, Routine w reflex microscopic     Status: Abnormal   Collection Time: 12/30/14  6:55 PM  Result Value Ref Range   Color, Urine AMBER (A) YELLOW    Comment: BIOCHEMICALS MAY BE AFFECTED BY COLOR   APPearance CLOUDY (A) CLEAR   Specific Gravity, Urine 1.019 1.005 - 1.030   pH 5.0 5.0 - 8.0   Glucose, UA NEGATIVE NEGATIVE mg/dL   Hgb urine dipstick LARGE (A) NEGATIVE   Bilirubin Urine NEGATIVE NEGATIVE   Ketones, ur NEGATIVE  NEGATIVE mg/dL   Protein, ur NEGATIVE NEGATIVE mg/dL   Urobilinogen, UA 0.2 0.0 - 1.0 mg/dL   Nitrite NEGATIVE NEGATIVE   Leukocytes, UA SMALL (A) NEGATIVE  Sodium, urine, random     Status: None   Collection Time: 12/30/14  6:55 PM  Result Value Ref Range   Sodium, Ur <10 mmol/L  Creatinine, urine, random     Status: None   Collection Time: 12/30/14  6:55 PM  Result Value Ref Range   Creatinine, Urine 99.86 mg/dL  Urine microscopic-add on     Status: Abnormal   Collection Time: 12/30/14  6:55 PM  Result Value Ref Range   Squamous Epithelial / LPF RARE RARE   WBC, UA 3-6 <3 WBC/hpf   RBC / HPF TOO NUMEROUS TO COUNT <3 RBC/hpf   Bacteria, UA FEW (A) RARE  Troponin I (q 6hr x 3)     Status: Abnormal   Collection Time: 12/30/14  7:15 PM  Result Value Ref Range   Troponin I 0.05 (H) <0.031 ng/mL    Comment:        PERSISTENTLY INCREASED TROPONIN VALUES IN THE RANGE OF 0.04-0.49 ng/mL CAN BE SEEN IN:       -UNSTABLE ANGINA       -CONGESTIVE HEART FAILURE       -MYOCARDITIS       -CHEST TRAUMA       -ARRYHTHMIAS       -LATE PRESENTING MYOCARDIAL INFARCTION       -COPD   CLINICAL FOLLOW-UP RECOMMENDED.   CBC     Status: Abnormal   Collection Time: 12/30/14  7:15 PM  Result Value Ref Range   WBC 43.9 (H) 4.0 - 10.5 K/uL   RBC 2.49 (L) 4.22 - 5.81 MIL/uL   Hemoglobin 8.4 (L) 13.0 - 17.0 g/dL   HCT 23.3 (L) 39.0 - 52.0 %   MCV 93.6 78.0 - 100.0 fL   MCH 33.7 26.0 - 34.0 pg   MCHC 36.1 (H) 30.0 - 36.0 g/dL   RDW 16.2 (H) 11.5 - 15.5 %   Platelets 220 150 - 400 K/uL    Comment: REPEATED TO VERIFY DELTA CHECK NOTED   Comprehensive metabolic panel     Status: Abnormal   Collection Time: 12/30/14  7:15 PM  Result Value Ref Range   Sodium 120 (L) 135 - 145 mmol/L   Potassium 3.5 3.5 - 5.1 mmol/L   Chloride 97 96 - 112 mmol/L   CO2 11 (L) 19 - 32 mmol/L   Glucose, Bld 147 (H) 70 - 99 mg/dL   BUN 95 (H) 6 -  23 mg/dL   Creatinine, Ser 2.28 (H) 0.50 - 1.35 mg/dL   Calcium  6.6 (L) 8.4 - 10.5 mg/dL   Total Protein 3.5 (L) 6.0 - 8.3 g/dL   Albumin 1.3 (L) 3.5 - 5.2 g/dL   AST 90 (H) 0 - 37 U/L   ALT 39 0 - 53 U/L   Alkaline Phosphatase 109 39 - 117 U/L   Total Bilirubin 2.0 (H) 0.3 - 1.2 mg/dL   GFR calc non Af Amer 29 (L) >90 mL/min   GFR calc Af Amer 33 (L) >90 mL/min    Comment: (NOTE) The eGFR has been calculated using the CKD EPI equation. This calculation has not been validated in all clinical situations. eGFR's persistently <90 mL/min signify possible Chronic Kidney Disease.    Anion gap 12 5 - 15  Phosphorus     Status: Abnormal   Collection Time: 12/30/14  7:15 PM  Result Value Ref Range   Phosphorus 5.8 (H) 2.3 - 4.6 mg/dL  Glucose, capillary     Status: Abnormal   Collection Time: 12/30/14 10:00 PM  Result Value Ref Range   Glucose-Capillary 169 (H) 70 - 99 mg/dL   Comment 1 Capillary Specimen   Comprehensive metabolic panel     Status: Abnormal   Collection Time: 12/31/14  5:20 AM  Result Value Ref Range   Sodium 117 (LL) 135 - 145 mmol/L    Comment: CRITICAL RESULT CALLED TO, READ BACK BY AND VERIFIED WITH: FOWLER,C RN 12/31/2014 0627 JORDANS REPEATED TO VERIFY    Potassium 3.1 (L) 3.5 - 5.1 mmol/L   Chloride 88 (L) 96 - 112 mmol/L   CO2 18 (L) 19 - 32 mmol/L   Glucose, Bld 195 (H) 70 - 99 mg/dL   BUN 95 (H) 6 - 23 mg/dL   Creatinine, Ser 2.27 (H) 0.50 - 1.35 mg/dL   Calcium 6.4 (LL) 8.4 - 10.5 mg/dL    Comment: CRITICAL RESULT CALLED TO, READ BACK BY AND VERIFIED WITH: FOWLER,C RN 12/31/2014 0627 JORDANS REPEATED TO VERIFY    Total Protein 3.8 (L) 6.0 - 8.3 g/dL   Albumin 1.3 (L) 3.5 - 5.2 g/dL   AST 130 (H) 0 - 37 U/L   ALT 53 0 - 53 U/L   Alkaline Phosphatase 111 39 - 117 U/L   Total Bilirubin 2.3 (H) 0.3 - 1.2 mg/dL   GFR calc non Af Amer 29 (L) >90 mL/min   GFR calc Af Amer 33 (L) >90 mL/min    Comment: (NOTE) The eGFR has been calculated using the CKD EPI equation. This calculation has not been validated in all  clinical situations. eGFR's persistently <90 mL/min signify possible Chronic Kidney Disease.    Anion gap 11 5 - 15  CBC     Status: Abnormal   Collection Time: 12/31/14  5:20 AM  Result Value Ref Range   WBC 41.4 (H) 4.0 - 10.5 K/uL    Comment: REPEATED TO VERIFY   RBC 2.56 (L) 4.22 - 5.81 MIL/uL   Hemoglobin 8.6 (L) 13.0 - 17.0 g/dL   HCT 23.7 (L) 39.0 - 52.0 %   MCV 92.6 78.0 - 100.0 fL   MCH 33.6 26.0 - 34.0 pg   MCHC 36.3 (H) 30.0 - 36.0 g/dL   RDW 16.2 (H) 11.5 - 15.5 %   Platelets 236 150 - 400 K/uL    Comment: REPEATED TO VERIFY DELTA CHECK NOTED   Magnesium     Status: None   Collection Time:  12/31/14  5:20 AM  Result Value Ref Range   Magnesium 1.6 1.5 - 2.5 mg/dL  Phosphorus     Status: Abnormal   Collection Time: 12/31/14  5:20 AM  Result Value Ref Range   Phosphorus 5.3 (H) 2.3 - 4.6 mg/dL    Imaging / Studies: Ct Abdomen Pelvis Wo Contrast  12/29/2014   CLINICAL DATA:  Status post bowel resection on 12/20/2014, status post laparotomy on 02/13 and 2/18, evaluate for intra-abdominal abscess or necrotic bowel  EXAM: CT ABDOMEN AND PELVIS WITHOUT CONTRAST  TECHNIQUE: Multidetector CT imaging of the abdomen and pelvis was performed following the standard protocol without IV contrast.  COMPARISON:  None.  FINDINGS: Motion degraded images.  Lower chest:  Lung bases are essentially clear.  Hepatobiliary: Cirrhotic configuration of the liver.  Gallbladder is unremarkable. No intrahepatic or extrahepatic ductal dilatation.  Pancreas: Within normal limits.  Spleen: Within normal limits.  Adrenals/Urinary Tract: Adrenal glands are unremarkable.  Kidneys are within normal limits. No renal calculi or hydronephrosis.  Bladder is within normal limits.  Stomach/Bowel: Stomach is within normal limits.  Moderate duodenal diverticulum along the 1st/2nd portion of the duodenum (series 2/image 28).  No evidence of bowel obstruction.  Prior small bowel resection with suture lines in the right  mid abdomen (series 2/image 65).  Wall thickening involving multiple segments of small bowel, including in the left lower abdomen (series 2/image 61 and 72) and beneath the right mid anterior abdominal wall (series 2/ image 57).  Wall thickening involving multiple segments of colon, including the cecum/right colon (series 2/image 50).  This generalized appearance of wall thickening is not overly concerning for ischemia. Additionally, there is no pneumatosis.  Vascular/Lymphatic: Atherosclerotic calcifications of the abdominal aorta and branch vessels, including mild calcification involving the proximal celiac artery (series 2/image 20) and moderate calcifications involving the SMA (series 2/ image 34). Vessel patency cannot be assessed on noncontrast examination.  SMV is mildly prominent while the portal vein is diminutive. There is no stranding in the fat surrounding the SMV.  4.1 x 4.2 cm infrarenal abdominal aortic aneurysm (series 2/image 48).  Reproductive: Prostate is unremarkable.  Other: Small volume abdominopelvic ascites.  Small volume pneumoperitoneum under the diaphragm (series 2/ image 20). Additional scattered foci free air beneath the anterior abdominal wall (for example, series 2/ image 44). This appearance could be related to recent laparotomy.  Postsurgical changes overlying the midline anterior abdominal wall.  Musculoskeletal: Degenerative changes of the visualized thoracolumbar spine.  Mild to moderate compression fracture deformity involving the anterior aspect of the L1 vertebral body (sagittal image 96). Prominent Schmorl's node involving the superior endplate at L2.  IMPRESSION: No findings specific for bowel ischemia on this noncontrast examination. No pneumatosis.  Scattered areas of small bowel and colonic wall thickening, including the cecum/right colon.  No evidence of bowel obstruction.  No evidence of intra abdominal abscess.  Small volume pneumoperitoneum with scattered foci of free  air beneath the anterior abdominal wall, likely related to recent laparotomy.  4.2 cm infrarenal abdominal aortic aneurysm.  Cirrhosis.  Additional ancillary findings as above.   Electronically Signed   By: Julian Hy M.D.   On: 12/29/2014 17:20   Dg Abd 1 View  12/29/2014   CLINICAL DATA:  Nasogastric tube placement.  EXAM: ABDOMEN - 1 VIEW  COMPARISON:  None.  FINDINGS: A nasogastric tube is seen with tip in the distal esophagus near the gastroesophageal junction. Mild dilatation of small bowel and colon is seen  likely due to ileus. Abdominal wall retention sutures noted.  IMPRESSION: Nasogastric tube tip overlies the distal esophagus near the GE junction. Mild ileus pattern.   Electronically Signed   By: Earle Gell M.D.   On: 12/29/2014 12:03   US Renal  12/30/2014   CLINICAL DATA:  Acute renal injury. Multiple recent laparotomies. History of cirrhosis, hypertension, pancreatitis.  EXAM: RENAL/URINARY TRACT ULTRASOUND COMPLETE  COMPARISON:  CT abdomen and pelvis 12/29/2014.  FINDINGS: Right Kidney:  Length: 12.2 cm. Renal parenchymal echotexture appears diffusely increased suggesting chronic medical renal disease. No hydronephrosis or solid mass.  Left Kidney:  Length: 10 cm. Renal parenchymal echotexture appears diffusely increased suggesting chronic medical renal disease. No hydronephrosis or solid mass.  Bladder:  Bladder is decompressed with a Foley catheter in is not evaluated.  Incidental note of focal areas of free fluid around the liver suggesting ascites.  IMPRESSION: Diffusely increased renal parenchymal echotexture suggesting chronic medical renal disease. No hydronephrosis. Small amount of free fluid suggesting ascites.   Electronically Signed   By: Lucienne Capers M.D.   On: 12/30/2014 21:12   Dg Chest Port 1 View  12/30/2014   CLINICAL DATA:  Endotracheal tube placement. Attempted right central venous line placement.  EXAM: PORTABLE CHEST - 1 VIEW  COMPARISON:  One-view chest from  the same day.  FINDINGS: Endotracheal tube terminates 2.4 cm above the carina. A left IJ line is stable. Heart is enlarged. Mild edema is stable. There is no pneumothorax. The visualized soft tissues and bony thorax are unremarkable.  IMPRESSION: 1. The endotracheal tube is somewhat low, 2.4 cm above the carina. 2. Stable left IJ line. 3. Mild cardiomegaly and edema.   Electronically Signed   By: San Morelle M.D.   On: 12/30/2014 14:49   Dg Chest Port 1 View  12/30/2014   CLINICAL DATA:  Subsequent evaluation for fluid retention  EXAM: PORTABLE CHEST - 1 VIEW  COMPARISON:  12/29/2014  FINDINGS: Mild to moderate cardiac enlargement stable. Status post prior CABG. Left internal jugular central line stable.  Moderate vascular congestion. There are heel wall thickening. Mild interstitial prominence.  IMPRESSION: Congestive heart failure with mild interstitial pulmonary edema, slightly worse when compared to prior study.   Electronically Signed   By: Skipper Cliche M.D.   On: 12/30/2014 10:24   Dg Chest Port 1 View  12/29/2014   CLINICAL DATA:  64 year old male with acute shortness of Breath. Initial encounter.  EXAM: PORTABLE CHEST - 1 VIEW  COMPARISON:  12/24/2014 and earlier.  FINDINGS: Portable AP semi upright view at 0921 hours. Lower lung volumes. Stable cardiac size and mediastinal contours. Sequelae of CABG. Stable left IJ central line. No pneumothorax. No pleural effusion or consolidation. Stable pulmonary vascularity. No confluent pulmonary opacity. Increased gaseous distension of the stomach.  IMPRESSION: Lower lung volumes otherwise no acute cardiopulmonary abnormality.  Increased gaseous distension of bowel in the left upper abdomen.   Electronically Signed   By: Genevie Ann M.D.   On: 12/29/2014 09:40    Medications / Allergies:  Scheduled Meds: . chlorhexidine  15 mL Mouth Rinse BID  . feeding supplement (ENSURE COMPLETE)  237 mL Oral BID BM  . heparin subcutaneous  5,000 Units  Subcutaneous 3 times per day  . insulin aspart  0-9 Units Subcutaneous TID WC  . ipratropium-albuterol  3 mL Nebulization Q6H  . metoprolol  2.5 mg Intravenous Q6H  . pantoprazole (PROTONIX) IV  40 mg Intravenous Q24H  . piperacillin-tazobactam (ZOSYN)  IV  3.375 g Intravenous Q8H  . potassium chloride  10 mEq Intravenous Q1 Hr x 4  . vancomycin  1,250 mg Intravenous Q24H   Continuous Infusions: . TPN (CLINIMIX) Adult without lytes 110 mL/hr at 12/30/14 2000   And  . fat emulsion 480 kcal (12/30/14 2000)  . fentaNYL infusion INTRAVENOUS 50 mcg/hr (12/30/14 2000)  . norepinephrine (LEVOPHED) Adult infusion 50 mcg/min (12/31/14 0521)  . phenylephrine (NEO-SYNEPHRINE) Adult infusion 160 mcg/min (12/31/14 0521)  .  sodium bicarbonate 150 mEq in sterile water 1000 mL infusion     PRN Meds:.Place/Maintain arterial line **AND** sodium chloride, fentaNYL, fentaNYL, fentaNYL, haloperidol lactate, levalbuterol, midazolam, ondansetron (ZOFRAN) IV, sodium chloride, white petrolatum  Antibiotics: Anti-infectives    Start     Dose/Rate Route Frequency Ordered Stop   12/30/14 1000  vancomycin (VANCOCIN) 1,250 mg in sodium chloride 0.9 % 250 mL IVPB     1,250 mg 166.7 mL/hr over 90 Minutes Intravenous Every 24 hours 12/30/14 0939     12/26/14 0930  vancomycin (VANCOCIN) 1,250 mg in sodium chloride 0.9 % 250 mL IVPB  Status:  Discontinued     1,250 mg 166.7 mL/hr over 90 Minutes Intravenous Every 24 hours 12/25/14 0912 12/28/14 0850   12/25/14 1000  piperacillin-tazobactam (ZOSYN) IVPB 3.375 g     3.375 g 12.5 mL/hr over 240 Minutes Intravenous Every 8 hours 12/25/14 0921     12/25/14 0930  vancomycin (VANCOCIN) 1,750 mg in sodium chloride 0.9 % 500 mL IVPB     1,750 mg 250 mL/hr over 120 Minutes Intravenous  Once 12/25/14 0912 12/25/14 1208   12/25/14 0900  piperacillin-tazobactam (ZOSYN) IVPB 3.375 g  Status:  Discontinued     3.375 g 100 mL/hr over 30 Minutes Intravenous 3 times per day  12/25/14 0852 12/25/14 0921   12/06/2014 2200  [MAR Hold]  rifaximin (XIFAXAN) tablet 550 mg  Status:  Discontinued     (MAR Hold since 12/29/2014 1721)   550 mg Oral 2 times daily 12/09/2014 1634 12/19/2014 1726   12/15/2014 1430  piperacillin-tazobactam (ZOSYN) IVPB 3.375 g     3.375 g 12.5 mL/hr over 240 Minutes Intravenous 3 times per day 12/14/2014 1403 12/24/14 0347      Assessment/Plan Ischemic bowel  Septic shock POD 15/14/8, s/p ex lap with open abdomen and SBR, s/p relook and closure, s/p ex lap and closure with retentions for dehiscence -continue eakin's pouch to measure peritoneal output and keep up with fluid loss -SCD/heparin as tolerated -mobilize -continue with TPN -completed 8 days of zosyn, restarted on 2/20. Vanc added 2/25.  Cirrhosis -followed at [facility] -lactulose stopped -ammonia 43 Post op ileus/PCM -resolved ileus, continue TPN AKI Hyponatremia, hypokalemia -peritoneal fluid output down from 3+L to 1500cc -renal on board -? Would reducing TPN and increasing IVF help delirium  PAF Respiratory failure-intubated  Erby Pian, [facility] Surgery Pager [phone](7A-4:30P) For consults and floor pages call [phone](7A-4:30P)  12/31/2014 8:28 AM

## 2014-12-31 NOTE — Progress Notes (Signed)
CRITICAL VALUE ALERT  Critical value received:  Sodium 117 Calcium 6.4  Date of notification:  12/31/14  Time of notification:  0625  Critical value read back:yes  Nurse who received alert:  Andreas Bloweraroline Fowler, RN  MD notified (1st page):  0630  Time of first page:  0630  Responding MD:  Dr. Sung AmabileSimonds  Time MD responded:  0630

## 2014-12-31 NOTE — Progress Notes (Signed)
PULMONARY / CRITICAL CARE MEDICINE   Name: Kyle West MRN: [id number] DOB: 1951-02-14    ADMISSION DATE:  12/19/2014   REFERRING MD :  OSH  CHIEF COMPLAINT:  abd pain   INITIAL PRESENTATION:  64yo male with hx HTN, CAD s/p CABG, cirrhosis of unclear etiology (followed at [facility]), reported recent hx pancreatitis.  Presented to OSH 2/11 with 2 day hx worsening abd pain, nausea and vomiting.  CT at outside hospital revealed pneumatosis, hepatic and portal venous gas and infrarenal AAA.  He was tx to [facility] and taken to OR 2/11 for exp lap r/t ischemic bowel/SBO.  Course c/b Afib with RVR and abd wound dehiscence on 2/18 with return to OR.  On 2/25 developed AMS, worsening hypotension and acidosis and PCCM re-consulted.   MAJOR EVENTS: 2/11 exploratory laparotomy, small bowel resection, application of wound vac (Dr Luisa Hart). Left intubated post op. Post op hypotension requiring CVL placement, volume resuscitation, vasopressors 2/12 sinus tachycardia, new onset AFRVR. Vasopressors changed from NE to PE. Low dose metoprolol initiated 2/13 back to OR, side-to-side small bowel anastomosis & closure of fascia.  Persistent AFib with RVR. Levo added. 2/15 extubated, off pressors 2/16 d/c amiodarone 2/18 Abd wound dehiscence, Return to OR  2/25 AMS, acidotic, hypotensive, tx ICU and PCCM re consulted.    TESTS: 2/11 CT abd/pelvis >> pneumatosis SB with moderate ascites, infrarenal AAA, cirrhosis   SUBJECTIVE:  Intubated and started on vasopressors, Na bicarb on 2/25   VITAL SIGNS: Temp:  [97.8 F (36.6 C)-98.7 F (37.1 C)] 98.7 F (37.1 C) (02/26 0800) Pulse Rate:  [80-108] 90 (02/26 0800) Resp:  [16-23] 16 (02/26 0800) BP: (65-183)/(19-65) 177/34 mmHg (02/26 0800) SpO2:  [98 %-100 %] 100 % (02/26 0800) FiO2 (%):  [40 %-100 %] 40 % (02/26 0800) Weight:  [110.8 kg (244 lb 4.3 oz)] 110.8 kg (244 lb 4.3 oz) (02/26 0337) INTAKE / OUTPUT:  Intake/Output Summary (Last 24 hours) at  12/31/14 1028 Last data filed at 12/31/14 0900  Gross per 24 hour  Intake 7741.05 ml  Output   3000 ml  Net 4741.05 ml    PHYSICAL EXAMINATION: General: chronically ill appearing male, sedated Neuro:  arousable, follows simple commands intermittently, MAE, weak  HEENT: ETT and OGT in place Cardiovascular: regular Lungs: resps even, non labored on bipap, scattered rhonchi Abdomen:  Soft, non distended, midline wound with retention sutures and eakins pouch with serosanguinous drainage > 2L last 24h Ext: no edema  LABS:  CBC  Recent Labs Lab 12/30/14 0744 12/30/14 1915 12/31/14 0520  WBC 31.5* 43.9* 41.4*  HGB 8.8* 8.4* 8.6*  HCT 24.7* 23.3* 23.7*  PLT 154 220 236   Coag's No results for input(s): APTT, INR in the last 168 hours. BMET  Recent Labs Lab 12/30/14 1400 12/30/14 1915 12/31/14 0520  NA 120* 120* 117*  K 3.6 3.5 3.1*  CL 97 97 88*  CO2 13* 11* 18*  BUN 95* 95* 95*  CREATININE 2.28* 2.28* 2.27*  GLUCOSE 152* 147* 195*   Electrolytes  Recent Labs Lab 12/29/14 0601 12/30/14 0430 12/30/14 1400 12/30/14 1915 12/31/14 0520  CALCIUM 7.0* 7.1* 6.6* 6.6* 6.4*  MG 2.2 1.9  --   --  1.6  PHOS 4.7* 5.6*  --  5.8* 5.3*   Sepsis Markers  Recent Labs Lab 12/25/14 1100  12/27/14 0437 12/28/14 0425 12/29/14 0601 12/29/14 0924 12/30/14 0900  LATICACIDVEN  --   < >  --  1.0  --  1.3 1.5  PROCALCITON 9.58  --  10.29  --  5.71  --   --   < > = values in this interval not displayed.   ABG  Recent Labs Lab 12/30/14 0655 12/30/14 1605 12/31/14 0921  PHART 7.322* 7.214* 7.340*  PCO2ART 24.1* 37.1 36.4  PO2ART 88.3 354.0* 105.0*   Liver Enzymes  Recent Labs Lab 12/30/14 0430 12/30/14 1915 12/31/14 0520  AST 72* 90* 130*  ALT 34 39 53  ALKPHOS 103 109 111  BILITOT 2.3* 2.0* 2.3*  ALBUMIN 1.4* 1.3* 1.3*   Cardiac Enzymes  Recent Labs Lab 12/30/14 0917 12/30/14 1400 12/30/14 1915  TROPONINI <0.03 0.06* 0.05*   Glucose  Recent  Labs Lab 12/29/14 1803 12/29/14 2323 12/30/14 0817 12/30/14 1703 12/30/14 2200 12/31/14 0733  GLUCAP 151* 179* 158* 148* 169* 198*     ASSESSMENT / PLAN:  PULMONARY ETT 2/11 >> 2/15 A: Acute respiratory failure - r/t sepsis +/- hepatic encephalopathy + metabolic acidosis Hx of OSA. P:   Continue vent support Repeat CXR and ABG 2/27   CARDIOVASCULAR L IJ CVL 2/11 >>  A line 2/11 > 2/16 A: Shock - recurrent.  ?new source sepsis vs hypovolemia from significant volume losses PAF with RVR -- converted to NSR 2/13. AAA - nondissecting. Hx of CAD s/p CABG, HTN, HLD. P:  Hold Metoprolol while on pressors - goal restart soon as had tachycardia likely r/t BB withdrawal previously  Even to positive fluid balance >> will need to aggressively replace abdominal wound losses Follow CVP  Changed CVL 2/25 Trend troponin  Follow echo  Wean Neo and norepi for SBP > 90  RENAL A:   AKI suspect due to volume losses from open abdomen, considering also sepsis Lactic acidosis  Hyponatremia  P:   Appreciate renal assistance Na Bicarb Will need to adjust Na in IVF Replace volume losses aggressively   GASTROINTESTINAL A:   Post laparotomy with bowel resection, repeat OR for fascial closure 2/13, wound dehiscence 2/18 Hx of alcoholic/NASH cirrhosis >> [facility] Diarrhea >> C diff negative  P:   SUP: IV pantoprazole TPN, wound care per surgery  Hold outpt aldactone, chronulac, xifaxan for now F/u LFT's    HEMATOLOGIC A:   Anemia, thrombocytopenia of critical illness and chronic disease. P:  SQ heparin for DVT prevention F/u CBC   INFECTIOUS A:   Septic shock 2nd to peritonitis. New sepsis 2/25 -- doubt abd source with benign abd exam, CT abd 2/24 without evidence ischemia, abscesses or recurrent obstruction.   P:   CDiff 2/16>>>  negative Urine 2/20>>>neg BCx2 2/25>>> Urine 2/25>>>  Zosyn 2/20>>> vanc 2/25>>>  Continue empiric abx  As  ordered  ENDOCRINE A:   Stress induced hyperglycemia P:   Continue SSI while on TPN   NEUROLOGIC A:   Hx of depression. Post-op pain control. Deconditioning. P:   Sedation per protocol  CC time 45 minutes  Levy Pupaobert Larah Kuntzman, MD, PhD 12/31/2014, 10:28 AM [facility] [phone] or if no answer [phone]

## 2014-12-31 NOTE — Progress Notes (Signed)
Assessment/Plan: 1 CKD 3 and AKI Underlying dz , suspect vasc vs HTN. Now with evidence hemodynamic injury,, r/o inflam ie AIN, vs obstruction. Need to cont fluid challenge and give bicarb. 2 Hyponatremia due to hypotonic fluids.  Must change TNA and during transition will add 3% saline 3 Hypertension:not an issue 4. Ischemic bowel 5. Metabolic Bone Disease: check PTH 6 Malnutrition 7 VDRF 8 Enceph multifact 9 low bp Vol r/o sepsis P I have spoken with pharmacy and lytes will be added to TNA.  Will give 3% hypertonic.  Subjective: Interval History: Pos fluid balance and 1450 out via colostomy  Objective: Vital signs in last 24 hours: Temp:  [97.8 F (36.6 C)-98.7 F (37.1 C)] 98.7 F (37.1 C) (02/26 0800) Pulse Rate:  [80-108] 90 (02/26 0800) Resp:  [16-23] 16 (02/26 0800) BP: (65-183)/(19-65) 177/34 mmHg (02/26 0800) SpO2:  [98 %-100 %] 100 % (02/26 0800) FiO2 (%):  [40 %-100 %] 40 % (02/26 0800) Weight:  [110.8 kg (244 lb 4.3 oz)] 110.8 kg (244 lb 4.3 oz) (02/26 0337) Weight change: 3.4 kg (7 lb 7.9 oz)  Intake/Output from previous day: 02/25 0701 - 02/26 0700 In: 6445.4 [I.V.:4065.4; IV Piggyback:100; TPN:2280] Out: 2850 [Urine:1400; Stool:1450] Intake/Output this shift: Total I/O In: 1295.7 [I.V.:335.7; TPN:960] Out: 150 [Urine:150]  no edema and reduced skin turgor  Lab Results:  Recent Labs  12/30/14 1915 12/31/14 0520  WBC 43.9* 41.4*  HGB 8.4* 8.6*  HCT 23.3* 23.7*  PLT 220 236   BMET:  Recent Labs  12/30/14 1915 12/31/14 0520  NA 120* 117*  K 3.5 3.1*  CL 97 88*  CO2 11* 18*  GLUCOSE 147* 195*  BUN 95* 95*  CREATININE 2.28* 2.27*  CALCIUM 6.6* 6.4*   No results for input(s): PTH in the last 72 hours. Iron Studies: No results for input(s): IRON, TIBC, TRANSFERRIN, FERRITIN in the last 72 hours. Studies/Results: Ct Abdomen Pelvis Wo Contrast  12/29/2014   CLINICAL DATA:  Status post bowel resection on 12/11/2014, status post laparotomy  on 02/13 and 2/18, evaluate for intra-abdominal abscess or necrotic bowel  EXAM: CT ABDOMEN AND PELVIS WITHOUT CONTRAST  TECHNIQUE: Multidetector CT imaging of the abdomen and pelvis was performed following the standard protocol without IV contrast.  COMPARISON:  None.  FINDINGS: Motion degraded images.  Lower chest:  Lung bases are essentially clear.  Hepatobiliary: Cirrhotic configuration of the liver.  Gallbladder is unremarkable. No intrahepatic or extrahepatic ductal dilatation.  Pancreas: Within normal limits.  Spleen: Within normal limits.  Adrenals/Urinary Tract: Adrenal glands are unremarkable.  Kidneys are within normal limits. No renal calculi or hydronephrosis.  Bladder is within normal limits.  Stomach/Bowel: Stomach is within normal limits.  Moderate duodenal diverticulum along the 1st/2nd portion of the duodenum (series 2/image 28).  No evidence of bowel obstruction.  Prior small bowel resection with suture lines in the right mid abdomen (series 2/image 65).  Wall thickening involving multiple segments of small bowel, including in the left lower abdomen (series 2/image 61 and 72) and beneath the right mid anterior abdominal wall (series 2/ image 57).  Wall thickening involving multiple segments of colon, including the cecum/right colon (series 2/image 50).  This generalized appearance of wall thickening is not overly concerning for ischemia. Additionally, there is no pneumatosis.  Vascular/Lymphatic: Atherosclerotic calcifications of the abdominal aorta and branch vessels, including mild calcification involving the proximal celiac artery (series 2/image 20) and moderate calcifications involving the SMA (series 2/ image 34). Vessel patency cannot be  assessed on noncontrast examination.  SMV is mildly prominent while the portal vein is diminutive. There is no stranding in the fat surrounding the SMV.  4.1 x 4.2 cm infrarenal abdominal aortic aneurysm (series 2/image 48).  Reproductive: Prostate is  unremarkable.  Other: Small volume abdominopelvic ascites.  Small volume pneumoperitoneum under the diaphragm (series 2/ image 20). Additional scattered foci free air beneath the anterior abdominal wall (for example, series 2/ image 44). This appearance could be related to recent laparotomy.  Postsurgical changes overlying the midline anterior abdominal wall.  Musculoskeletal: Degenerative changes of the visualized thoracolumbar spine.  Mild to moderate compression fracture deformity involving the anterior aspect of the L1 vertebral body (sagittal image 96). Prominent Schmorl's node involving the superior endplate at L2.  IMPRESSION: No findings specific for bowel ischemia on this noncontrast examination. No pneumatosis.  Scattered areas of small bowel and colonic wall thickening, including the cecum/right colon.  No evidence of bowel obstruction.  No evidence of intra abdominal abscess.  Small volume pneumoperitoneum with scattered foci of free air beneath the anterior abdominal wall, likely related to recent laparotomy.  4.2 cm infrarenal abdominal aortic aneurysm.  Cirrhosis.  Additional ancillary findings as above.   Electronically Signed   By: Charline BillsSriyesh  Krishnan M.D.   On: 12/29/2014 17:20   Dg Abd 1 View  12/29/2014   CLINICAL DATA:  Nasogastric tube placement.  EXAM: ABDOMEN - 1 VIEW  COMPARISON:  None.  FINDINGS: A nasogastric tube is seen with tip in the distal esophagus near the gastroesophageal junction. Mild dilatation of small bowel and colon is seen likely due to ileus. Abdominal wall retention sutures noted.  IMPRESSION: Nasogastric tube tip overlies the distal esophagus near the GE junction. Mild ileus pattern.   Electronically Signed   By: Myles RosenthalJohn  Stahl M.D.   On: 12/29/2014 12:03   [place] Renal  12/30/2014   CLINICAL DATA:  Acute renal injury. Multiple recent laparotomies. History of cirrhosis, hypertension, pancreatitis.  EXAM: RENAL/URINARY TRACT ULTRASOUND COMPLETE  COMPARISON:  CT abdomen and  pelvis 12/29/2014.  FINDINGS: Right Kidney:  Length: 12.2 cm. Renal parenchymal echotexture appears diffusely increased suggesting chronic medical renal disease. No hydronephrosis or solid mass.  Left Kidney:  Length: 10 cm. Renal parenchymal echotexture appears diffusely increased suggesting chronic medical renal disease. No hydronephrosis or solid mass.  Bladder:  Bladder is decompressed with a Foley catheter in is not evaluated.  Incidental note of focal areas of free fluid around the liver suggesting ascites.  IMPRESSION: Diffusely increased renal parenchymal echotexture suggesting chronic medical renal disease. No hydronephrosis. Small amount of free fluid suggesting ascites.   Electronically Signed   By: Burman NievesWilliam  Stevens M.D.   On: 12/30/2014 21:12   Dg Chest Port 1 View  12/30/2014   CLINICAL DATA:  Endotracheal tube placement. Attempted right central venous line placement.  EXAM: PORTABLE CHEST - 1 VIEW  COMPARISON:  One-view chest from the same day.  FINDINGS: Endotracheal tube terminates 2.4 cm above the carina. A left IJ line is stable. Heart is enlarged. Mild edema is stable. There is no pneumothorax. The visualized soft tissues and bony thorax are unremarkable.  IMPRESSION: 1. The endotracheal tube is somewhat low, 2.4 cm above the carina. 2. Stable left IJ line. 3. Mild cardiomegaly and edema.   Electronically Signed   By: Marin Robertshristopher  Mattern M.D.   On: 12/30/2014 14:49   Dg Chest Port 1 View  12/30/2014   CLINICAL DATA:  Subsequent evaluation for fluid retention  EXAM:  PORTABLE CHEST - 1 VIEW  COMPARISON:  12/29/2014  FINDINGS: Mild to moderate cardiac enlargement stable. Status post prior CABG. Left internal jugular central line stable.  Moderate vascular congestion. There are heel wall thickening. Mild interstitial prominence.  IMPRESSION: Congestive heart failure with mild interstitial pulmonary edema, slightly worse when compared to prior study.   Electronically Signed   By: Esperanza Heir  M.D.   On: 12/30/2014 10:24    Scheduled: . chlorhexidine  15 mL Mouth Rinse BID  . feeding supplement (ENSURE COMPLETE)  237 mL Oral BID BM  . heparin subcutaneous  5,000 Units Subcutaneous 3 times per day  . insulin aspart  0-9 Units Subcutaneous TID WC  . ipratropium-albuterol  3 mL Nebulization Q6H  . magnesium sulfate 1 - 4 g bolus IVPB  2 g Intravenous Once  . metoprolol  2.5 mg Intravenous Q6H  . pantoprazole (PROTONIX) IV  40 mg Intravenous Q24H  . piperacillin-tazobactam (ZOSYN)  IV  3.375 g Intravenous Q8H  . potassium chloride  10 mEq Intravenous Q1 Hr x 4  . vancomycin  1,250 mg Intravenous Q24H     LOS: 15 days   Juliah Scadden C 12/31/2014,10:02 AM

## 2014-12-31 NOTE — Progress Notes (Signed)
NUTRITION FOLLOW UP   Intervention:    Pharmacy managing TPN  Recommend maximizing protein if possible.   Recommend trickle feedings as soon as possible to stimulate GI tract as well as to provide additional protein.   Nutrition Dx:   Inadequate oral intake related to inability to eat as evidenced by recent bowel surgery and clear liquid diet; ongoing  Goal:   Intake to meet >90% of estimated nutrition needs; not met.  Monitor:   TPN tolerance, vent status, ability to start enteral nutrition, labs, weight trend.  Assessment:   64yo male with hx HTN, CAD s/p CABG, cirrhosis, reported recent hx pancreatitis. Presented with 2 day hx worsening abd pain, nausea and vomiting.  SP ex lap with small bowel resection due to ischemic bowel and application of wound VAC on 2/11.  Pt tx to 2H due to trouble breathing, pt intubated 2/25 now in septic shock.Pt started on TPN 2/12 due to ileus.   Patient is currently intubated on ventilator support MV: 11.7 L/min Temp (24hrs), Avg:98.3 F (36.8 C), Min:97.8 F (36.6 C), Max:98.7 F (37.1 C)  Pt with eakin pouch to abdomen Output has decreased from 3+L to 1500 ml Output: 1450 ml 2/25 1800 ml 2/24 2750 ml 2/23 3200 ml 2/22  Patient is currently receiving TPN: Clinimix electrolytes @ 110 ml/hr and 20% lipids @ 10 ml/hr. ( pt also starting 3% NS IVF's) Provides 2354 kcal, and 132 grams protein per day. Meets 100% minimum estimated energy needs and 100% minimum estimated protein needs.  Labs: Low sodium and potassium Elevated BUN/Cr and phosphorus  Norepinephrine and Phenylephrine started 2/25; MAP > 60.   Height: Ht Readings from Last 1 Encounters:  12/28/14 6' 1"  (1.854 m)    Weight Status:   Wt Readings from Last 1 Encounters:  12/31/14 244 lb 4.3 oz (110.8 kg)    12/21/14 252 lb 10.4 oz (114.6 kg)       12/17/14 235 lb 14.3 oz (107 kg)       Re-estimated needs:  Kcal: 2177 Protein: 160-180 grams Fluid: >2.2  L  Skin: abdominal incision, deep tissue injury on nose due to NGT  Diet Order: Diet NPO time specified TPN (CLINIMIX) Adult without lytes .TPN (CLINIMIX-E) Adult   Intake/Output Summary (Last 24 hours) at 12/31/14 1117 Last data filed at 12/31/14 0900  Gross per 24 hour  Intake 7741.05 ml  Output   3000 ml  Net 4741.05 ml    Last BM: 2/26, green   Labs:   Recent Labs Lab 12/29/14 0601 12/30/14 0430 12/30/14 1400 12/30/14 1915 12/31/14 0520  NA 124* 122* 120* 120* 117*  K 4.0 3.7 3.6 3.5 3.1*  CL 105 99 97 97 88*  CO2 13* 14* 13* 11* 18*  BUN 72* 93* 95* 95* 95*  CREATININE 1.58* 2.28* 2.28* 2.28* 2.27*  CALCIUM 7.0* 7.1* 6.6* 6.6* 6.4*  MG 2.2 1.9  --   --  1.6  PHOS 4.7* 5.6*  --  5.8* 5.3*  GLUCOSE 148* 179* 152* 147* 195*    CBG (last 3)   Recent Labs  12/30/14 1703 12/30/14 2200 12/31/14 0733  GLUCAP 148* 169* 198*    Scheduled Meds: . chlorhexidine  15 mL Mouth Rinse BID  . feeding supplement (ENSURE COMPLETE)  237 mL Oral BID BM  . heparin subcutaneous  5,000 Units Subcutaneous 3 times per day  . insulin aspart  0-9 Units Subcutaneous TID WC  . ipratropium-albuterol  3 mL Nebulization Q6H  .  pantoprazole (PROTONIX) IV  40 mg Intravenous Q24H  . piperacillin-tazobactam (ZOSYN)  IV  3.375 g Intravenous Q8H  . potassium chloride  10 mEq Intravenous Q1 Hr x 4  . vancomycin  1,250 mg Intravenous Q24H    Continuous Infusions: . Marland KitchenTPN (CLINIMIX-E) Adult     And  . fat emulsion    . TPN (CLINIMIX) Adult without lytes 110 mL/hr at 12/31/14 0900   And  . fat emulsion 240 mL (12/31/14 0900)  . fentaNYL infusion INTRAVENOUS 50 mcg/hr (12/31/14 0900)  . norepinephrine (LEVOPHED) Adult infusion 50 mcg/min (12/31/14 0900)  . phenylephrine (NEO-SYNEPHRINE) Adult infusion 160 mcg/min (12/31/14 0900)  .  sodium bicarbonate 150 mEq in sterile water 1000 mL infusion 100 mL/hr at 12/31/14 1033  . sodium chloride (hypertonic)      Maylon Peppers RD, [place],  [place] Pager [phone] After Hours Pager

## 2014-12-31 NOTE — Progress Notes (Signed)
eLink Physician-Brief Progress Note Patient Name: Romana Juniperathaniel Esses DOB: [birth year] MRN: [id number]   Date of Service  12/31/2014  HPI/Events of Note  Continued hyponatremia on 3% saline  eICU Interventions  Plan: Continue 3% saline overnight at 40 ml/hr Check BMET q4 hours     Intervention Category Major Interventions: Electrolyte abnormality - evaluation and management  Krystofer Hevener 12/31/2014, 11:30 PM

## 2014-12-31 NOTE — Progress Notes (Signed)
PT Cancellation Note  Patient Details Name: Kyle West MRN: [id number] DOB: 1951/11/04   Cancelled Treatment:    Reason Eval/Treat Not Completed: Medical issues which prohibited therapy (intubated), patient with decline in status, at this time not appropriate for therapy services, will sign off at this time.  Please re-consult when medically stable and appropriate for discharge.   Fabio AsaWerner, Thad Osoria J 12/31/2014, 2:14 PM  Charlotte Crumbevon Deadra Diggins, PT DPT  [phone]

## 2014-12-31 NOTE — Progress Notes (Signed)
SLP Cancellation Note  Patient Details Name: Kyle West MRN: [id number] DOB: [birth year]   Cancelled treatment:       Reason Eval/Treat Not Completed: Medical issues which prohibited therapy. Pt intubated, will sign off. Please reorder SLP for cognitive linguistic treatment when pt is able to participate.    Ashlin Kreps, Riley NearingBonnie Caroline 12/31/2014, 7:33 AM

## 2014-12-31 NOTE — Progress Notes (Signed)
eLink Physician-Brief Progress Note Patient Name: Kyle West DOB: 29-Aug-1951 MRN: [id number]   Date of Service  12/31/2014  HPI/Events of Note  Patient now s/p abd surgery.  Had been on SSI with meals.  Now on TPN.  Has some renal insufficiency  eICU Interventions  Plan: Placed on SSI - sensitive scale.     Intervention Category Intermediate Interventions: Hyperglycemia - evaluation and treatment  DETERDING,ELIZABETH 12/31/2014, 10:34 PM

## 2014-12-31 NOTE — Progress Notes (Signed)
eLink Physician-Brief Progress Note Patient Name: Kyle West DOB: [birth year] MRN: [id number]   Date of Service  12/31/2014  HPI/Events of Note  Appears obstructed on ventilator flow curves  eICU Interventions  Bronchodilators initiated     Intervention Category Intermediate Interventions: Bronchospasm - evaluation and treatment  Billy FischerDavid Nalina Yeatman 12/31/2014, 4:59 AM

## 2014-12-31 NOTE — Progress Notes (Signed)
OT Cancellation Note  Patient Details Name: Kyle West MRN: [id number] DOB: [birth year]   Cancelled Treatment:    Reason Eval/Treat Not Completed: Medical issues which prohibited therapy. Pt now intubated and sedated.  Signing off, will await new orders.  [name], Geneive Sandstrom Lynn 12/31/2014, 10:13 AM

## 2015-01-01 ENCOUNTER — Inpatient Hospital Stay (HOSPITAL_COMMUNITY): Payer: BLUE CROSS/BLUE SHIELD

## 2015-01-01 DIAGNOSIS — R0600 Dyspnea, unspecified: Secondary | ICD-10-CM

## 2015-01-01 LAB — CBC
HCT: 21.5 % — ABNORMAL LOW (ref 39.0–52.0)
HEMOGLOBIN: 7.8 g/dL — AB (ref 13.0–17.0)
MCH: 33.9 pg (ref 26.0–34.0)
MCHC: 36.3 g/dL — AB (ref 30.0–36.0)
MCV: 93.5 fL (ref 78.0–100.0)
Platelets: 195 10*3/uL (ref 150–400)
RBC: 2.3 MIL/uL — AB (ref 4.22–5.81)
RDW: 16.3 % — ABNORMAL HIGH (ref 11.5–15.5)
WBC: 26.6 10*3/uL — AB (ref 4.0–10.5)

## 2015-01-01 LAB — BASIC METABOLIC PANEL
ANION GAP: 9 (ref 5–15)
Anion gap: 7 (ref 5–15)
Anion gap: 6 (ref 5–15)
Anion gap: 5 (ref 5–15)
BUN: 95 mg/dL — AB (ref 6–23)
BUN: 93 mg/dL — ABNORMAL HIGH (ref 6–23)
BUN: 94 mg/dL — AB (ref 6–23)
BUN: 95 mg/dL — ABNORMAL HIGH (ref 6–23)
CO2: 25 mmol/L (ref 19–32)
CO2: 25 mmol/L (ref 19–32)
CO2: 25 mmol/L (ref 19–32)
CO2: 25 mmol/L (ref 19–32)
CREATININE: 2.18 mg/dL — AB (ref 0.50–1.35)
Calcium: 6.3 mg/dL — CL (ref 8.4–10.5)
Calcium: 6.5 mg/dL — ABNORMAL LOW (ref 8.4–10.5)
Calcium: 6.5 mg/dL — ABNORMAL LOW (ref 8.4–10.5)
Calcium: 6.6 mg/dL — ABNORMAL LOW (ref 8.4–10.5)
Chloride: 85 mmol/L — ABNORMAL LOW (ref 96–112)
Chloride: 89 mmol/L — ABNORMAL LOW (ref 96–112)
Chloride: 92 mmol/L — ABNORMAL LOW (ref 96–112)
Chloride: 94 mmol/L — ABNORMAL LOW (ref 96–112)
Creatinine, Ser: 2.13 mg/dL — ABNORMAL HIGH (ref 0.50–1.35)
Creatinine, Ser: 2.15 mg/dL — ABNORMAL HIGH (ref 0.50–1.35)
Creatinine, Ser: 2.09 mg/dL — ABNORMAL HIGH (ref 0.50–1.35)
GFR calc Af Amer: 35 mL/min — ABNORMAL LOW (ref >90)
GFR calc Af Amer: 36 mL/min — ABNORMAL LOW (ref >90)
GFR calc Af Amer: 36 mL/min — ABNORMAL LOW (ref >90)
GFR calc Af Amer: 37 mL/min — ABNORMAL LOW (ref >90)
GFR calc non Af Amer: 30 mL/min — ABNORMAL LOW (ref >90)
GFR calc non Af Amer: 31 mL/min — ABNORMAL LOW (ref >90)
GFR calc non Af Amer: 31 mL/min — ABNORMAL LOW (ref >90)
GFR calc non Af Amer: 32 mL/min — ABNORMAL LOW (ref >90)
GLUCOSE: 196 mg/dL — AB (ref 70–99)
Glucose, Bld: 203 mg/dL — ABNORMAL HIGH (ref 70–99)
Glucose, Bld: 194 mg/dL — ABNORMAL HIGH (ref 70–99)
Glucose, Bld: 180 mg/dL — ABNORMAL HIGH (ref 70–99)
POTASSIUM: 3.2 mmol/L — AB (ref 3.5–5.1)
POTASSIUM: 3.7 mmol/L (ref 3.5–5.1)
Potassium: 3.6 mmol/L (ref 3.5–5.1)
Potassium: 3.7 mmol/L (ref 3.5–5.1)
Sodium: 119 mmol/L — CL (ref 135–145)
Sodium: 121 mmol/L — ABNORMAL LOW (ref 135–145)
Sodium: 123 mmol/L — ABNORMAL LOW (ref 135–145)
Sodium: 124 mmol/L — ABNORMAL LOW (ref 135–145)

## 2015-01-01 LAB — GLUCOSE, CAPILLARY
GLUCOSE-CAPILLARY: 217 mg/dL — AB (ref 70–99)
GLUCOSE-CAPILLARY: 196 mg/dL — AB (ref 70–99)
Glucose-Capillary: 188 mg/dL — ABNORMAL HIGH (ref 70–99)
Glucose-Capillary: 197 mg/dL — ABNORMAL HIGH (ref 70–99)
Glucose-Capillary: 204 mg/dL — ABNORMAL HIGH (ref 70–99)
Glucose-Capillary: 208 mg/dL — ABNORMAL HIGH (ref 70–99)

## 2015-01-01 LAB — COMPREHENSIVE METABOLIC PANEL
ALT: 57 U/L — AB (ref 0–53)
AST: 136 U/L — ABNORMAL HIGH (ref 0–37)
Albumin: 1 g/dL — ABNORMAL LOW (ref 3.5–5.2)
Alkaline Phosphatase: 116 U/L (ref 39–117)
Anion gap: 7 (ref 5–15)
BILIRUBIN TOTAL: 2.3 mg/dL — AB (ref 0.3–1.2)
BUN: 93 mg/dL — ABNORMAL HIGH (ref 6–23)
CALCIUM: 6.6 mg/dL — AB (ref 8.4–10.5)
CHLORIDE: 86 mmol/L — AB (ref 96–112)
CO2: 25 mmol/L (ref 19–32)
CREATININE: 2.22 mg/dL — AB (ref 0.50–1.35)
GFR calc Af Amer: 34 mL/min — ABNORMAL LOW (ref >90)
GFR, EST NON AFRICAN AMERICAN: 30 mL/min — AB (ref >90)
GLUCOSE: 200 mg/dL — AB (ref 70–99)
Potassium: 3.4 mmol/L — ABNORMAL LOW (ref 3.5–5.1)
SODIUM: 118 mmol/L — AB (ref 135–145)
TOTAL PROTEIN: 3.3 g/dL — AB (ref 6.0–8.3)

## 2015-01-01 MED ORDER — SODIUM CHLORIDE 3 % IV SOLN
INTRAVENOUS | Status: AC
  Administered 2015-01-01: 50 mL/h via INTRAVENOUS
  Filled 2015-01-01: qty 500

## 2015-01-01 MED ORDER — CETYLPYRIDINIUM CHLORIDE 0.05 % MT LIQD
7.0000 mL | Freq: Four times a day (QID) | OROMUCOSAL | Status: DC
  Administered 2015-01-01 – 2015-01-08 (×28): 7 mL via OROMUCOSAL

## 2015-01-01 MED ORDER — TRACE MINERALS CR-CU-F-FE-I-MN-MO-SE-ZN IV SOLN
INTRAVENOUS | Status: AC
  Administered 2015-01-01: 18:00:00 via INTRAVENOUS
  Filled 2015-01-01: qty 2640

## 2015-01-01 MED ORDER — SODIUM CHLORIDE 0.9 % IV SOLN: INTRAVENOUS | Status: DC | PRN

## 2015-01-01 MED ORDER — CITRIC ACID-SODIUM CITRATE 334-500 MG/5ML PO SOLN
30.0000 mL | Freq: Four times a day (QID) | ORAL | Status: DC
  Administered 2015-01-02 (×5): 30 mL via ORAL
  Filled 2015-01-01 (×12): qty 30

## 2015-01-01 MED ORDER — FAT EMULSION 20 % IV EMUL
240.0000 mL | INTRAVENOUS | Status: AC
  Administered 2015-01-01: 240 mL via INTRAVENOUS
  Filled 2015-01-01: qty 250

## 2015-01-01 MED ORDER — SODIUM CHLORIDE 0.9 % IV SOLN
INTRAVENOUS | Status: DC
  Administered 2015-01-01 – 2015-01-02 (×2): via INTRAVENOUS
  Administered 2015-01-02 (×2): 125 mL/h via INTRAVENOUS
  Administered 2015-01-02 – 2015-01-03 (×2): via INTRAVENOUS

## 2015-01-01 MED ORDER — SODIUM CHLORIDE 0.9 % IV SOLN
INTRAVENOUS | Status: DC
  Administered 2015-01-01: 250 mL via INTRAVENOUS

## 2015-01-01 MED ORDER — FAT EMULSION 20 % IV EMUL
240.0000 mL | INTRAVENOUS | Status: DC
  Filled 2015-01-01: qty 250

## 2015-01-01 MED ORDER — PIVOT 1.5 CAL PO LIQD
1000.0000 mL | ORAL | Status: DC
  Administered 2015-01-02 – 2015-01-04 (×4): 1000 mL
  Filled 2015-01-01 (×6): qty 1000

## 2015-01-01 MED ORDER — TRACE MINERALS CR-CU-F-FE-I-MN-MO-SE-ZN IV SOLN
INTRAVENOUS | Status: DC
  Filled 2015-01-01: qty 2640

## 2015-01-01 MED ORDER — POTASSIUM CHLORIDE 10 MEQ/50ML IV SOLN
10.0000 meq | INTRAVENOUS | Status: AC
  Administered 2015-01-01 (×3): 10 meq via INTRAVENOUS
  Filled 2015-01-01 (×2): qty 50

## 2015-01-01 MED ORDER — PIVOT 1.5 CAL PO LIQD
1000.0000 mL | ORAL | Status: DC
  Filled 2015-01-01: qty 1000

## 2015-01-01 MED ORDER — CHLORHEXIDINE GLUCONATE 0.12 % MT SOLN
15.0000 mL | Freq: Two times a day (BID) | OROMUCOSAL | Status: DC
  Administered 2015-01-01 – 2015-01-07 (×14): 15 mL via OROMUCOSAL
  Filled 2015-01-01 (×14): qty 15

## 2015-01-01 NOTE — Progress Notes (Signed)
PULMONARY / CRITICAL CARE MEDICINE   Name: Kyle West MRN: [id number] DOB: [birth year]    ADMISSION DATE:  12/12/2014   REFERRING MD :  OSH  CHIEF COMPLAINT:  abd pain   INITIAL PRESENTATION:  64yo male with hx HTN, CAD s/p CABG, cirrhosis of unclear etiology (followed at [facility]), reported recent hx pancreatitis.  Presented to OSH 2/11 with 2 day hx worsening abd pain, nausea and vomiting.  CT at outside hospital revealed pneumatosis, hepatic and portal venous gas and infrarenal AAA.  He was tx to [facility] and taken to OR 2/11 for exp lap r/t ischemic bowel/SBO.  Course c/b Afib with RVR and abd wound dehiscence on 2/18 with return to OR.  On 2/25 developed AMS, worsening hypotension and acidosis and PCCM re-consulted.   MAJOR EVENTS: 2/11 exploratory laparotomy, small bowel resection, application of wound vac (Dr Luisa Hart). Left intubated post op. Post op hypotension requiring CVL placement, volume resuscitation, vasopressors 2/12 sinus tachycardia, new onset AFRVR. Vasopressors changed from NE to PE. Low dose metoprolol initiated 2/13 back to OR, side-to-side small bowel anastomosis & closure of fascia.  Persistent AFib with RVR. Levo added. 2/15 extubated, off pressors 2/16 d/c amiodarone 2/18 Abd wound dehiscence, Return to OR  2/25 AMS, acidotic, hypotensive, tx ICU and PCCM re consulted.    TESTS: 2/11 CT abd/pelvis >> pneumatosis SB with moderate ascites, infrarenal AAA, cirrhosis   SUBJECTIVE:  Remains on pressors, TNA, 3% NS.   VITAL SIGNS: Temp:  [98.6 F (37 C)-98.9 F (37.2 C)] 98.7 F (37.1 C) (02/27 0400) Pulse Rate:  [87-108] 98 (02/27 0500) Resp:  [16-23] 16 (02/27 0500) BP: (147-192)/(32-47) 176/32 mmHg (02/26 2000) SpO2:  [100 %] 100 % (02/27 0500) Arterial Line BP: (98-165)/(35-51) 101/35 mmHg (02/27 0500) FiO2 (%):  [40 %] 40 % (02/27 0420) Weight:  [244 lb 4.3 oz (110.8 kg)] 244 lb 4.3 oz (110.8 kg) (02/27 0500) INTAKE / OUTPUT:  Intake/Output Summary  (Last 24 hours) at 01/01/15 0636 Last data filed at 01/01/15 0600  Gross per 24 hour  Intake 9085.9 ml  Output   3610 ml  Net 5475.9 ml    PHYSICAL EXAMINATION: General: no distress Neuro:  RASS -1 HEENT: ETT and OGT in place Cardiovascular: regular Lungs: scattered rhonchi Abdomen:  Soft, wound site clean Ext: 1+ edema  LABS:  CBC  Recent Labs Lab 12/30/14 1915 12/31/14 0520 01/01/15 0330  WBC 43.9* 41.4* 26.6*  HGB 8.4* 8.6* 7.8*  HCT 23.3* 23.7* 21.5*  PLT 220 236 195   Coag's No results for input(s): APTT, INR in the last 168 hours.   BMET  Recent Labs Lab 12/31/14 1614 12/31/14 2126 01/01/15 0330  NA 118* 116* 118*  K 3.5 3.4* 3.4*  CL 88* 87* 86*  CO2 BUN 94* 93* 93*  CREATININE 2.29* 2.18* 2.22*  GLUCOSE 123* 227* 200*   Electrolytes  Recent Labs Lab 12/29/14 0601 12/30/14 0430  12/30/14 1915 12/31/14 0520  12/31/14 1614 12/31/14 2126 01/01/15 0330  CALCIUM 7.0* 7.1*  < > 6.6* 6.4*  < > 6.5* 6.7* 6.6*  MG 2.2 1.9  --   --  1.6  --   --   --   --   PHOS 4.7* 5.6*  --  5.8* 5.3*  --   --   --   --   < > = values in this interval not displayed.   Sepsis Markers  Recent Labs Lab 12/25/14 1100  12/27/14 0437 12/28/14 0425  12/29/14 0601 12/29/14 0924 12/30/14 0900  LATICACIDVEN  --   < >  --  1.0  --  1.3 1.5  PROCALCITON 9.58  --  10.29  --  5.71  --   --   < > = values in this interval not displayed.   ABG  Recent Labs Lab 12/30/14 0655 12/30/14 1605 12/31/14 0921  PHART 7.322* 7.214* 7.340*  PCO2ART 24.1* 37.1 36.4  PO2ART 88.3 354.0* 105.0*   Liver Enzymes  Recent Labs Lab 12/30/14 1915 12/31/14 0520 01/01/15 0330  AST 90* 130* 136*  ALT 39 53 57*  ALKPHOS 109 111 116  BILITOT 2.0* 2.3* 2.3*  ALBUMIN 1.3* 1.3* 1.0*   Cardiac Enzymes  Recent Labs Lab 12/30/14 0917 12/30/14 1400 12/30/14 1915  TROPONINI <0.03 0.06* 0.05*   Glucose  Recent Labs Lab 12/30/14 2200 12/31/14 0733  12/31/14 1709 12/31/14 2100 01/01/15 0007 01/01/15 0329  GLUCAP 169* 198* 120* 166* 208* 217*    [place] Renal  12/30/2014   CLINICAL DATA:  Acute renal injury. Multiple recent laparotomies. History of cirrhosis, hypertension, pancreatitis.  EXAM: RENAL/URINARY TRACT ULTRASOUND COMPLETE  COMPARISON:  CT abdomen and pelvis 12/29/2014.  FINDINGS: Right Kidney:  Length: 12.2 cm. Renal parenchymal echotexture appears diffusely increased suggesting chronic medical renal disease. No hydronephrosis or solid mass.  Left Kidney:  Length: 10 cm. Renal parenchymal echotexture appears diffusely increased suggesting chronic medical renal disease. No hydronephrosis or solid mass.  Bladder:  Bladder is decompressed with a Foley catheter in is not evaluated.  Incidental note of focal areas of free fluid around the liver suggesting ascites.  IMPRESSION: Diffusely increased renal parenchymal echotexture suggesting chronic medical renal disease. No hydronephrosis. Small amount of free fluid suggesting ascites.   Electronically Signed   By: Burman NievesWilliam  Stevens M.D.   On: 12/30/2014 21:12   Dg Chest Port 1 View  12/30/2014   CLINICAL DATA:  Endotracheal tube placement. Attempted right central venous line placement.  EXAM: PORTABLE CHEST - 1 VIEW  COMPARISON:  One-view chest from the same day.  FINDINGS: Endotracheal tube terminates 2.4 cm above the carina. A left IJ line is stable. Heart is enlarged. Mild edema is stable. There is no pneumothorax. The visualized soft tissues and bony thorax are unremarkable.  IMPRESSION: 1. The endotracheal tube is somewhat low, 2.4 cm above the carina. 2. Stable left IJ line. 3. Mild cardiomegaly and edema.   Electronically Signed   By: Marin Robertshristopher  Mattern M.D.   On: 12/30/2014 14:49   Dg Chest Port 1 View  12/30/2014   CLINICAL DATA:  Subsequent evaluation for fluid retention  EXAM: PORTABLE CHEST - 1 VIEW  COMPARISON:  12/29/2014  FINDINGS: Mild to moderate cardiac enlargement stable. Status  post prior CABG. Left internal jugular central line stable.  Moderate vascular congestion. There are heel wall thickening. Mild interstitial prominence.  IMPRESSION: Congestive heart failure with mild interstitial pulmonary edema, slightly worse when compared to prior study.   Electronically Signed   By: Esperanza Heiraymond  Rubner M.D.   On: 12/30/2014 10:24     ASSESSMENT / PLAN:  PULMONARY ETT 2/11 >> 2/15 ETT 2/25 >> A: Acute respiratory failure - r/t sepsis +/- hepatic encephalopathy + metabolic acidosis. Hx of OSA. P:   Full vent support F/u CXR, ABG Scheduled duoneb  CARDIOVASCULAR Rt femoral CVL 2/25 >> Rt radial aline 2/27 >> A: Shock likely from sepsis and hypovolemia. PAF with RVR -- converted to NSR 2/13. AAA - nondissecting. Hx of CAD s/p CABG,  HTN, HLD. P:  Wean pressors to keep SBP > 90 MAP > 65 Echo ordered 2/25 >>  RENAL A:   AKI suspect due to volume losses from open abdomen, considering also sepsis. Lactic acidosis. Hyponatremia.  P:   Continue 3%NS  D/c HCO3 2/27  GASTROINTESTINAL A:   Post laparotomy with bowel resection, repeat OR for fascial closure 2/13, wound dehiscence 2/18. Hx of alcoholic/NASH cirrhosis >> followed at [facility]. Diarrhea >> C diff negative. P:   SUP: IV pantoprazole TPN, wound care per surgery  Hold outpt aldactone, chronulac, xifaxan for now F/u LFT's intermittently  HEMATOLOGIC A:   Anemia, thrombocytopenia of critical illness and chronic disease. P:  SQ heparin for DVT prevention F/u CBC  INFECTIOUS A:   Septic shock 2nd to peritonitis. New sepsis 2/25 -- doubt abd source with benign abd exam, CT abd 2/24 without evidence ischemia, abscesses or recurrent obstruction.   P:   CDiff 2/16>>>  negative Urine 2/20>>>neg BCx2 2/25>>> Urine 2/25>>>  Zosyn 2/20>>> vanc 2/25>>>2/27  ENDOCRINE A:   Stress induced hyperglycemia P:   Continue SSI while on TPN   NEUROLOGIC A:   Hx of depression. Post-op pain  control. Deconditioning. P:   Sedation per protocol RASS goal -1  CC time 35 minutes.  Coralyn Helling, MD [facility] Pulmonary/Critical Care 01/01/2015, 6:43 AM Pager:  [phone] After 3pm call: [phone]

## 2015-01-01 NOTE — Progress Notes (Signed)
CCS/Aracelis Ulrey Progress Note 9 Days Post-Op  Subjective: Patient is agitated on the ventilator and getting a lot of fluids.   Nutrition per TPN.  Objective: Vital signs in last 24 hours: Temp:  [98.6 F (37 C)-98.9 F (37.2 C)] 98.7 F (37.1 C) (02/27 0400) Pulse Rate:  [87-101] 100 (02/27 0600) Resp:  [16-23] 16 (02/27 0600) BP: (147-192)/(32-47) 176/32 mmHg (02/26 2000) SpO2:  [100 %] 100 % (02/27 0600) Arterial Line BP: (98-165)/(35-51) 101/35 mmHg (02/27 0600) FiO2 (%):  [40 %] 40 % (02/27 0420) Weight:  [110.8 kg (244 lb 4.3 oz)] 110.8 kg (244 lb 4.3 oz) (02/27 0500) Last BM Date: 12/30/14  Intake/Output from previous day: 02/26 0701 - 02/27 0700 In: 9405.6 [I.V.:5348.4; IV Piggyback:800; TPN:3257.2] Out: 3610 [Urine:1660; Stool:1950] Intake/Output this shift:    General: Agitated and on the ventilator  Lungs: Clear to auscultation  Abd: Wound leaking a bit yesterday, has been fixed.  Total drainage over 1300cc.  Midline wound is macerated a bit, but intact.  Did not remove the bag to directly inspect the wound, but intact with chronic leakage.  Extremities: Mildly edematous  Neuro: Agitated and confused. Ammonia level was mildly elevated two days ago.  Lab Results:  (wbc:2,hgb:2,hct:2,plt:2) BMET  Recent Labs  12/31/14 2126 01/01/15 0330  NA 116* 118*  K 3.4* 3.4*  CL 87* 86*  CO2 22 25  GLUCOSE 227* 200*  BUN 93* 93*  CREATININE 2.18* 2.22*  CALCIUM 6.7* 6.6*   PT/INR No results for input(s): LABPROT, INR in the last 72 hours. ABG  Recent Labs  12/30/14 1605 12/31/14 0921  PHART 7.214* 7.340*  HCO3 15.0* 19.6*    Studies/Results: US Renal  12/30/2014   CLINICAL DATA:  Acute renal injury. Multiple recent laparotomies. History of cirrhosis, hypertension, pancreatitis.  EXAM: RENAL/URINARY TRACT ULTRASOUND COMPLETE  COMPARISON:  CT abdomen and pelvis 12/29/2014.  FINDINGS: Right Kidney:  Length: 12.2 cm. Renal parenchymal echotexture appears  diffusely increased suggesting chronic medical renal disease. No hydronephrosis or solid mass.  Left Kidney:  Length: 10 cm. Renal parenchymal echotexture appears diffusely increased suggesting chronic medical renal disease. No hydronephrosis or solid mass.  Bladder:  Bladder is decompressed with a Foley catheter in is not evaluated.  Incidental note of focal areas of free fluid around the liver suggesting ascites.  IMPRESSION: Diffusely increased renal parenchymal echotexture suggesting chronic medical renal disease. No hydronephrosis. Small amount of free fluid suggesting ascites.   Electronically Signed   By: Burman Nieves M.D.   On: 12/30/2014 21:12   Dg Chest Port 1 View  12/30/2014   CLINICAL DATA:  Endotracheal tube placement. Attempted right central venous line placement.  EXAM: PORTABLE CHEST - 1 VIEW  COMPARISON:  One-view chest from the same day.  FINDINGS: Endotracheal tube terminates 2.4 cm above the carina. A left IJ line is stable. Heart is enlarged. Mild edema is stable. There is no pneumothorax. The visualized soft tissues and bony thorax are unremarkable.  IMPRESSION: 1. The endotracheal tube is somewhat low, 2.4 cm above the carina. 2. Stable left IJ line. 3. Mild cardiomegaly and edema.   Electronically Signed   By: Marin Roberts M.D.   On: 12/30/2014 14:49   Dg Chest Port 1 View  12/30/2014   CLINICAL DATA:  Subsequent evaluation for fluid retention  EXAM: PORTABLE CHEST - 1 VIEW  COMPARISON:  12/29/2014  FINDINGS: Mild to moderate cardiac enlargement stable. Status post prior CABG. Left internal jugular central line stable.  Moderate vascular  congestion. There are heel wall thickening. Mild interstitial prominence.  IMPRESSION: Congestive heart failure with mild interstitial pulmonary edema, slightly worse when compared to prior study.   Electronically Signed   By: Esperanza Heiraymond  Rubner M.D.   On: 12/30/2014 10:24    Anti-infectives: Anti-infectives    Start     Dose/Rate Route  Frequency Ordered Stop   12/30/14 1000  vancomycin (VANCOCIN) 1,250 mg in sodium chloride 0.9 % 250 mL IVPB  Status:  Discontinued     1,250 mg 166.7 mL/hr over 90 Minutes Intravenous Every 24 hours 12/30/14 0939 01/01/15 0645   12/26/14 0930  vancomycin (VANCOCIN) 1,250 mg in sodium chloride 0.9 % 250 mL IVPB  Status:  Discontinued     1,250 mg 166.7 mL/hr over 90 Minutes Intravenous Every 24 hours 12/25/14 0912 12/28/14 0850   12/25/14 1000  piperacillin-tazobactam (ZOSYN) IVPB 3.375 g     3.375 g 12.5 mL/hr over 240 Minutes Intravenous Every 8 hours 12/25/14 0921     12/25/14 0930  vancomycin (VANCOCIN) 1,750 mg in sodium chloride 0.9 % 500 mL IVPB     1,750 mg 250 mL/hr over 120 Minutes Intravenous  Once 12/25/14 0912 12/25/14 1208   12/25/14 0900  piperacillin-tazobactam (ZOSYN) IVPB 3.375 g  Status:  Discontinued     3.375 g 100 mL/hr over 30 Minutes Intravenous 3 times per day 12/25/14 0852 12/25/14 0921   12/19/2014 2200  [MAR Hold]  rifaximin (XIFAXAN) tablet 550 mg  Status:  Discontinued     (MAR Hold since 12/06/2014 1721)   550 mg Oral 2 times daily 01/02/2015 1634 12/24/2014 1726   12/31/2014 1430  piperacillin-tazobactam (ZOSYN) IVPB 3.375 g     3.375 g 12.5 mL/hr over 240 Minutes Intravenous 3 times per day 12/13/2014 1403 12/24/14 0347      Assessment/Plan: s/p Procedure(s): EXPLORATORY LAPAROTOMY WITH CLOSURE OF EVISCERATION Tube feedings  No other changes.  LOS: 16 days   Marta LamasJames O. Gae BonWyatt, III, MD, FACS [phone]--pager [phone]--office [facility] 01/01/2015

## 2015-01-01 NOTE — Progress Notes (Addendum)
Left nare panda tube was advanced  last marking on tubing. Dr. Geanie BerlinBelinger was notified and stated the tube needed to be post pyloric, currently tube in fundus of stomach and unable to advance tube further. Tube was removed per recommendation of Dr. Geanie BerlinBelinger. Rounding team will readdress in am.

## 2015-01-01 NOTE — Progress Notes (Signed)
  Echocardiogram 2D Echocardiogram has been performed.  Kyle West, Kyle West 01/01/2015, 9:01 AM

## 2015-01-01 NOTE — Progress Notes (Signed)
Assessment/Plan: 1 CKD 3 and AKI 2 Hyponatremia due to hypotonic fluids.  3 S/P bowel resection 4 Cirrhosis  P Will restart 3% NS, add sodium citrate via NG; we still need a higher concentration of sodium in TNA but not available at [facility]  Subjective: Interval History: Slow sodium improvement.  TNA now with 35 meq/l  Objective: Vital signs in last 24 hours: Temp:  [98.6 F (37 C)-98.9 F (37.2 C)] 98.7 F (37.1 C) (02/27 0400) Pulse Rate:  [87-101] 100 (02/27 0600) Resp:  [16-23] 16 (02/27 0600) BP: (147-192)/(32-47) 176/32 mmHg (02/26 2000) SpO2:  [100 %] 100 % (02/27 0600) Arterial Line BP: (98-165)/(35-51) 101/35 mmHg (02/27 0600) FiO2 (%):  [40 %] 40 % (02/27 0750) Weight:  [110.8 kg (244 lb 4.3 oz)] 110.8 kg (244 lb 4.3 oz) (02/27 0500) Weight change: 0 kg (0 lb)  Intake/Output from previous day: 02/26 0701 - 02/27 0700 In: 9405.6 [I.V.:5348.4; IV Piggyback:800; TPN:3257.2] Out: 3610 [Urine:1660; Stool:1950] Intake/Output this shift:    moves some, no edema on vent   Lab Results:  Recent Labs  12/31/14 0520 01/01/15 0330  WBC 41.4* 26.6*  HGB 8.6* 7.8*  HCT 23.7* 21.5*  PLT 236 195   BMET:  Recent Labs  12/31/14 2126 01/01/15 0330  NA 116* 118*  K 3.4* 3.4*  CL 87* 86*  CO2 22 25  GLUCOSE 227* 200*  BUN 93* 93*  CREATININE 2.18* 2.22*  CALCIUM 6.7* 6.6*   No results for input(s): PTH in the last 72 hours. Iron Studies:  Recent Labs  12/31/14 0520  IRON 18*  TIBC 157*   Studies/Results: US Renal  12/30/2014   CLINICAL DATA:  Acute renal injury. Multiple recent laparotomies. History of cirrhosis, hypertension, pancreatitis.  EXAM: RENAL/URINARY TRACT ULTRASOUND COMPLETE  COMPARISON:  CT abdomen and pelvis 12/29/2014.  FINDINGS: Right Kidney:  Length: 12.2 cm. Renal parenchymal echotexture appears diffusely increased suggesting chronic medical renal disease. No hydronephrosis or solid mass.  Left Kidney:  Length: 10 cm. Renal parenchymal  echotexture appears diffusely increased suggesting chronic medical renal disease. No hydronephrosis or solid mass.  Bladder:  Bladder is decompressed with a Foley catheter in is not evaluated.  Incidental note of focal areas of free fluid around the liver suggesting ascites.  IMPRESSION: Diffusely increased renal parenchymal echotexture suggesting chronic medical renal disease. No hydronephrosis. Small amount of free fluid suggesting ascites.   Electronically Signed   By: Burman Nieves M.D.   On: 12/30/2014 21:12   Dg Chest Port 1 View  12/30/2014   CLINICAL DATA:  Endotracheal tube placement. Attempted right central venous line placement.  EXAM: PORTABLE CHEST - 1 VIEW  COMPARISON:  One-view chest from the same day.  FINDINGS: Endotracheal tube terminates 2.4 cm above the carina. A left IJ line is stable. Heart is enlarged. Mild edema is stable. There is no pneumothorax. The visualized soft tissues and bony thorax are unremarkable.  IMPRESSION: 1. The endotracheal tube is somewhat low, 2.4 cm above the carina. 2. Stable left IJ line. 3. Mild cardiomegaly and edema.   Electronically Signed   By: Marin Roberts M.D.   On: 12/30/2014 14:49   Dg Chest Port 1 View  12/30/2014   CLINICAL DATA:  Subsequent evaluation for fluid retention  EXAM: PORTABLE CHEST - 1 VIEW  COMPARISON:  12/29/2014  FINDINGS: Mild to moderate cardiac enlargement stable. Status post prior CABG. Left internal jugular central line stable.  Moderate vascular congestion. There are heel wall thickening. Mild interstitial  prominence.  IMPRESSION: Congestive heart failure with mild interstitial pulmonary edema, slightly worse when compared to prior study.   Electronically Signed   By: Esperanza Heiraymond  Rubner M.D.   On: 12/30/2014 10:24   Scheduled: . antiseptic oral rinse  7 mL Mouth Rinse QID  . chlorhexidine  15 mL Mouth Rinse BID  . feeding supplement (PIVOT 1.5 CAL)  1,000 mL Per Tube Q24H  . heparin subcutaneous  5,000 Units  Subcutaneous 3 times per day  . insulin aspart  0-9 Units Subcutaneous 6 times per day  . ipratropium-albuterol  3 mL Nebulization Q6H  . pantoprazole (PROTONIX) IV  40 mg Intravenous Q24H  . piperacillin-tazobactam (ZOSYN)  IV  3.375 g Intravenous Q8H     LOS: 16 days   Tanicka Bisaillon C 01/01/2015,8:39 AM

## 2015-01-01 NOTE — Progress Notes (Addendum)
PARENTERAL NUTRITION CONSULT NOTE - FOLLOW UP  Pharmacy Consult for TPN Indication: Prolonged ileus  Allergies  Allergen Reactions  . Oxycodone Hives and Other (See Comments)    Hallucinations   Patient Measurements: Height: 6' 1"  (185.4 cm) Weight: 244 lb 4.3 oz (110.8 kg) IBW/kg (Calculated) : 79.9  Vital Signs: Temp: 98.7 F (37.1 C) (02/27 0400) Temp Source: Oral (02/27 0400) BP: 176/32 mmHg (02/26 2000) Pulse Rate: 100 (02/27 0600) Intake/Output from previous day: 02/26 0701 - 02/27 0700 In: 9405.6 [I.V.:5348.4; IV Piggyback:800; TPN:3257.2] Out: 3610 [Urine:1660; Stool:1951]  Labs:  Recent Labs  12/30/14 1915 12/31/14 0520 01/01/15 0330  WBC 43.9* 41.4* 26.6*  HGB 8.4* 8.6* 7.8*  HCT 23.3* 23.7* 21.5*  PLT 220 236 195   Recent Labs  12/29/14 1000 12/30/14 0430  12/30/14 1855 12/30/14 1915 12/31/14 0520  12/31/14 1614 12/31/14 2126 01/01/15 0330  NA  --  122*  < >  --  120* 117*  < > 118* 116* 118*  K  --  3.7  < >  --  3.5 3.1*  < > 3.5 3.4* 3.4*  CL  --  99  < >  --  97 88*  < > 88* 87* 86*  CO2  --  14*  < >  --  11* 18*  < > 20 22 25   GLUCOSE  --  179*  < >  --  147* 195*  < > 123* 227* 200*  BUN  --  93*  < >  --  95* 95*  < > 94* 93* 93*  CREATININE  --  2.28*  < >  --  2.28* 2.27*  < > 2.29* 2.18* 2.22*  LABCREA  --   --   --  99.86  --   --   --   --   --   --   CALCIUM  --  7.1*  < >  --  6.6* 6.4*  < > 6.5* 6.7* 6.6*  MG  --  1.9  --   --   --  1.6  --   --   --   --   PHOS  --  5.6*  --   --  5.8* 5.3*  --   --   --   --   PROT 4.4* 3.8*  --   --  3.5* 3.8*  --   --   --  3.3*  ALBUMIN 1.7* 1.4*  --   --  1.3* 1.3*  --   --   --  1.0*  AST 62* 72*  --   --  90* 130*  --   --   --  136*  ALT 29 34  --   --  39 53  --   --   --  57*  ALKPHOS 121* 103  --   --  109 111  --   --   --  116  BILITOT 2.6* 2.3*  --   --  2.0* 2.3*  --   --   --  2.3*  BILIDIR 1.1*  --   --   --   --   --   --   --   --   --   IBILI 1.5*  --   --   --   --   --    --   --   --   --   < > = values in this interval not displayed. Estimated Creatinine  Clearance: 43.9 mL/min (by C-G formula based on Cr of 2.22).   Recent Labs  12/31/14 2100 01/01/15 0007 01/01/15 0329  GLUCAP 166* 208* 217*   Insulin Requirements in the past 24 hours:  6 units sensitive SSI, 12 units insulin in TPN  Nutritional Goals: (per RD note on 2/22) 2200-2400 kcal, 120-135g of protein per day  Current Nutrition:  -Clinimix 5/15 at 170m/hr and Lipids at 174mhr, providing 132g protein and 2354 kCal (meets 100% of goals) -Ensure complete BID- each provides 350kCal and 13g protein- ordered, has not had any; changed back to NPO yesterday.  Admit:  [id number] transferred from outside hospital for concern of ischemic bowel. He underwent surgery on 2/11 - exploratory laparotomy, small bowel resection and application of a wound vac. TPN was started in anticipation of prolonged bowel rest/ileus.   GI:  S/P ex. Lap with SBR on 2/11. Also history of pancreatitis and cirrhosis child pugh B. PTIRJ:JOACZYSAYPPI, Rifaximin, spironolactione, Ursodiol. S/P side-to-side small bowel anastomosis and closure of abdomen on 2/13.Continues to have ascites from wound- wound in tact with Eakin pouch to contain drainage- serous, blood tinged with 2L last 24hr. +BS. Baseline prealbumin low at 6.4, this week it still remains low at 3.5. Patient decompensated yesterday and calorie count and diet were stopped.  Endo: No history of diabetes. CBGs 166-217- has 12 units (receiving ~10.5 units) regular insulin in TPN bag along with SSI ordered.  CBG's trending up some now > 200 x2, will change current TPN regimen.  Lytes: Na dropped to 122 continues down (118), K 3.4 (supplementing with K-runs), Mg 1.6, Phos dec. 5.3, CorCa 9 (CorCa x phos product ~48).  Sodium bicarb was resumed at 10019mr - follow closely.  Hypertonic saline added to regimen yesterday - no real benefit thus far.  Changed TPN to include  electrolytes yesterday - with plans to continue unless lytes trend higher with worsening renal picture.  Renal: AKI s/p surgery which was resolving. Now SCr bumped again-1.58>2.22, UOP at 0.6 ml/kg/hr in last 24h.   Pulm:CPAP  Cards: History of CAD. BP elevated, HR wnl. Metoprolol IV scheduled. Did have episode of PAF after surgery which has since resolved.  Hepatobil: history of cirrhosis/NASH.alkp phos nml, AST trending back up (136),  ALT trending up, Tbili at 2.3. INR 1.67 (2/13). TG 56.  Ammonia 27 (2/19; improved on lactulose- now stopped)  Neuro: Now with post anesthesia hepatic encephalopathy. PRN Haldol  ID: WBC down (26.6) - Zosyn for intra-abdominal coverage (2/11>>), vanc dc'd last evening. Afebrile, PCT 5.7, LA 1.3. CDiff(-) 2/16, re-checked 2/24 and was negative as well.  New CVC line (2/25), PIV line (2/25)  Best Practices: SCDs, mouthcare, Hep SQ, PPI   TPN Access: Triple Lumen IJ - changed to rt. Femoral triple lumen (2/25) TPN day#:15; (12/17/14 >> )  Plan:  -Clinimix with LYTES 5/15 at 110m68m with 20% IVFE at 10mL10m This is providing 100% of patient's goals -Daily MVI and TE in TPN -Increase insulin to 15 units regular insulin in TPN bag (patient will receive ~12 units) -Continue SSI -MIVF as per MD orders  -F/U BMET with am labs  Deavon Podgorski Rober MinionrmD., MS Clinical Pharmacist Pager:  [phone] you for allowing pharmacy to be part of this patients care team. 01/01/2015 7:22 AM   Addendum: Spoke with Dr. PowelFlorene Glenwill concentrate TPN to make it 0.9% to help with low sodium issues.  Per approved guidelines:  Managing Hyponatremia in Patients on Clinimix - May add sodium  supplementation to Clinimix if one of the following criteria is met: 1. Na+ ? 128 mEq/L x 7 days 2. Symptomatic from hyponatremia 3. Clinimix is the cause of hyponatremia 4. Keep in mind that other methods must be addressed prior to adding sodium to Clinimix, i.e. fluid  restriction, IVF changed to NS, sodium tablet, etc. - Current Clinimix formulas contain 35 mEq/L of Na+, and 1:2 Cl:Ac ratio  New Regimen:  Clinimix with Lytes 5/15 with additional sodium of 119 meq/L to make 0.9%NaCl.  Rober Minion, PharmD., MS

## 2015-01-01 NOTE — Progress Notes (Signed)
Previous arterial line became dislodged.  Orders received to replace line.  Line placed using sterile technique, drape.  Collateral circulation verified prior to placement.  20 gauge arrow catheter used.  Catheter placed in RR artery with one attempt.  Good blood flow and waveform, no adverse effects, patient tolerated well.  Normal saline flush used, site cleansed and dressed per RT protocol.  RN at bedside.

## 2015-01-02 ENCOUNTER — Inpatient Hospital Stay (HOSPITAL_COMMUNITY): Payer: BLUE CROSS/BLUE SHIELD

## 2015-01-02 LAB — COMPREHENSIVE METABOLIC PANEL
ALBUMIN: 1 g/dL — AB (ref 3.5–5.2)
ALT: 49 U/L (ref 0–53)
AST: 121 U/L — ABNORMAL HIGH (ref 0–37)
Alkaline Phosphatase: 114 U/L (ref 39–117)
Anion gap: 6 (ref 5–15)
BILIRUBIN TOTAL: 2.5 mg/dL — AB (ref 0.3–1.2)
BUN: 89 mg/dL — ABNORMAL HIGH (ref 6–23)
CHLORIDE: 97 mmol/L (ref 96–112)
CO2: 24 mmol/L (ref 19–32)
Calcium: 6.7 mg/dL — ABNORMAL LOW (ref 8.4–10.5)
Creatinine, Ser: 1.99 mg/dL — ABNORMAL HIGH (ref 0.50–1.35)
GFR calc Af Amer: 39 mL/min — ABNORMAL LOW (ref >90)
GFR, EST NON AFRICAN AMERICAN: 34 mL/min — AB (ref >90)
Glucose, Bld: 201 mg/dL — ABNORMAL HIGH (ref 70–99)
Potassium: 3.7 mmol/L (ref 3.5–5.1)
Sodium: 127 mmol/L — ABNORMAL LOW (ref 135–145)
TOTAL PROTEIN: 3.3 g/dL — AB (ref 6.0–8.3)

## 2015-01-02 LAB — GLUCOSE, CAPILLARY
Glucose-Capillary: 172 mg/dL — ABNORMAL HIGH (ref 70–99)
Glucose-Capillary: 173 mg/dL — ABNORMAL HIGH (ref 70–99)
Glucose-Capillary: 193 mg/dL — ABNORMAL HIGH (ref 70–99)
Glucose-Capillary: 206 mg/dL — ABNORMAL HIGH (ref 70–99)
Glucose-Capillary: 210 mg/dL — ABNORMAL HIGH (ref 70–99)
Glucose-Capillary: 213 mg/dL — ABNORMAL HIGH (ref 70–99)

## 2015-01-02 LAB — BLOOD GAS, ARTERIAL
Acid-base deficit: 2.1 mmol/L — ABNORMAL HIGH (ref 0.0–2.0)
Bicarbonate: 22.7 mEq/L (ref 20.0–24.0)
FIO2: 0.4 %
MECHVT: 640 mL
O2 SAT: 98.9 %
PATIENT TEMPERATURE: 98.6
PEEP/CPAP: 5 cmH2O
PH ART: 7.353 (ref 7.350–7.450)
PO2 ART: 124 mmHg — AB (ref 80.0–100.0)
RATE: 16 resp/min
TCO2: 23.9 mmol/L (ref 0–100)
pCO2 arterial: 41.9 mmHg (ref 35.0–45.0)

## 2015-01-02 LAB — CBC
HCT: 21.6 % — ABNORMAL LOW (ref 39.0–52.0)
HEMOGLOBIN: 7.7 g/dL — AB (ref 13.0–17.0)
MCH: 32.9 pg (ref 26.0–34.0)
MCHC: 35.6 g/dL (ref 30.0–36.0)
MCV: 92.3 fL (ref 78.0–100.0)
Platelets: 185 10*3/uL (ref 150–400)
RBC: 2.34 MIL/uL — ABNORMAL LOW (ref 4.22–5.81)
RDW: 16.4 % — AB (ref 11.5–15.5)
WBC: 23.1 10*3/uL — AB (ref 4.0–10.5)

## 2015-01-02 LAB — BASIC METABOLIC PANEL
Anion gap: 4 — ABNORMAL LOW (ref 5–15)
BUN: 94 mg/dL — ABNORMAL HIGH (ref 6–23)
CALCIUM: 6.6 mg/dL — AB (ref 8.4–10.5)
CHLORIDE: 97 mmol/L (ref 96–112)
CO2: 25 mmol/L (ref 19–32)
Creatinine, Ser: 2.03 mg/dL — ABNORMAL HIGH (ref 0.50–1.35)
GFR calc non Af Amer: 33 mL/min — ABNORMAL LOW (ref >90)
GFR, EST AFRICAN AMERICAN: 38 mL/min — AB (ref >90)
Glucose, Bld: 187 mg/dL — ABNORMAL HIGH (ref 70–99)
Potassium: 3.8 mmol/L (ref 3.5–5.1)
SODIUM: 126 mmol/L — AB (ref 135–145)

## 2015-01-02 MED ORDER — PANTOPRAZOLE SODIUM 40 MG PO PACK
40.0000 mg | PACK | ORAL | Status: DC
  Administered 2015-01-02 – 2015-01-06 (×5): 40 mg
  Filled 2015-01-02 (×7): qty 20

## 2015-01-02 MED ORDER — TRACE MINERALS CR-CU-F-FE-I-MN-MO-SE-ZN IV SOLN
INTRAVENOUS | Status: AC
  Administered 2015-01-02: 17:00:00 via INTRAVENOUS
  Filled 2015-01-02: qty 2400

## 2015-01-02 NOTE — Progress Notes (Signed)
PARENTERAL NUTRITION CONSULT NOTE - FOLLOW UP  Pharmacy Consult for TPN Indication: Prolonged ileus  Allergies  Allergen Reactions  . Oxycodone Hives and Other (See Comments)    Hallucinations   Patient Measurements: Height:  (185.4 cm) Weight: 251 lb 5.2 oz (114 kg) IBW/kg (Calculated) : 79.9  Vital Signs: Temp: 97.6 F (36.4 C) (02/28 0800) Temp Source: Oral (02/28 0800) Pulse Rate: 102 (02/28 0800) Intake/Output from previous day: 02/27 0701 - 02/28 0700 In: 6923.3 [I.V.:3996.6; NG/GT:92.7; IV Piggyback:200; ZOX:0960] Out: 4675 [Urine:1625; Stool:3050]  Labs:  Recent Labs  12/31/14 0520 01/01/15 0330 01/02/15 0345  WBC 41.4* 26.6* 23.1*  HGB 8.6* 7.8* 7.7*  HCT 23.7* 21.5* 21.6*  PLT 236 195 185   Recent Labs  12/30/14 1855  12/30/14 1915 12/31/14 0520  01/01/15 0330  01/01/15 2100 01/02/15 0001 01/02/15 0345  NA  --   --  120* 117*  < > 118*  < > 124* 126* 127*  K  --   --  3.5 3.1*  < > 3.4*  < > 3.7 3.8 3.7  CL  --   --  97 88*  < > 86*  < > 94* 97 97  CO2  --   --  11* 18*  < > 25  < > GLUCOSE  --   --  147* 195*  < > 200*  < > 180* 187* 201*  BUN  --   --  95* 95*  < > 93*  < > 95* 94* 89*  CREATININE  --   --  2.28* 2.27*  < > 2.22*  < > 2.09* 2.03* 1.99*  LABCREA 99.86  --   --   --   --   --   --   --   --   --   CALCIUM  --   --  6.6* 6.4*  < > 6.6*  < > 6.6* 6.6* 6.7*  MG  --   --   --  1.6  --   --   --   --   --   --   PHOS  --   --  5.8* 5.3*  --   --   --   --   --   --   PROT  --   < > 3.5* 3.8*  --  3.3*  --   --   --  3.3*  ALBUMIN  --   < > 1.3* 1.3*  --  1.0*  --   --   --  1.0*  AST  --   < > 90* 130*  --  136*  --   --   --  121*  ALT  --   < > 39 53  --  57*  --   --   --  49  ALKPHOS  --   < > 109 111  --  116  --   --   --  114  BILITOT  --   < > 2.0* 2.3*  --  2.3*  --   --   --  2.5*  < > = values in this interval not displayed. Estimated Creatinine Clearance: 49.6 mL/min (by C-G formula based on Cr of 1.99).    Recent Labs  01/02/15 0027 01/02/15 0344 01/02/15 0755  GLUCAP 206* 193* 173*   Insulin Requirements in the past 24 hours:  9 units sensitive SSI, 12 units insulin in TPN  Nutritional Goals: (  per RD note on 2/22) 2200-2400 kcal, 120-135g of protein per day New Goals 2/26 per RD - Kcal 2177 and Protein of 160-180 gm  Current Nutrition:  -Clinimix E 5/15 at 15510ml/hr and Lipids at 6110ml/hr, providing 132g protein and 2354 kCal (meets 100% of goals) - He was started on Pivot 1.5 at 6520ml/hr last night and seems to be tolerating this well.  (current rate provides ~ 45 gm protein and 720 kcal)  Admit:  [name] transferred from outside hospital for concern of ischemic bowel. He underwent surgery on 2/11 - exploratory laparotomy, small bowel resection and application of a wound vac. TPN was started in anticipation of prolonged bowel rest/ileus.   GI:  S/P ex. Lap with SBR on 2/11. Also history of pancreatitis and cirrhosis child pugh B. ZOX:[id number]:Lactulose, PPI, Rifaximin, spironolactione, Ursodiol. S/P side-to-side small bowel anastomosis and closure of abdomen on 2/13.Continues to have ascites from wound- wound in tact with Eakin pouch to contain drainage- serous, blood tinged with 2L last 24hr. +BS. Baseline prealbumin low at 6.4, this week it still remains low at 3.5. Patient decompensated yesterday and calorie count and diet were stopped.  Endo: No history of diabetes. CBGs 188-206- has 15 units (receiving ~12 units) regular insulin in TPN bag along with SSI ordered.    Lytes: Changed TPN to include electrolytes and additional sodium.  Will continue this regimen today with improved hyponatremia (116 >>127)  Renal: AKI s/p surgery which was resolving. Now SCr bumped again-1.58>2.22, UOP at 0.6 ml/kg/hr in last 24h.   Pulm:CPAP  Cards: History of CAD. BP elevated, Tachy this morning. Metoprolol IV scheduled. Did have episode of PAF after surgery which has since  resolved.  Hepatobil: history of cirrhosis/NASH.alkp phos nml, AST (121),  ALT (49), Tbili at 2.5. INR 1.67 (2/13). TG 56.  Ammonia 27 (2/19; improved on lactulose- now stopped)  Neuro: Now with post anesthesia hepatic encephalopathy. PRN Haldol  ID: WBC down (23.1) - Zosyn for intra-abdominal coverage (2/11>>), vanc dc'd last evening. Afebrile, PCT 5.7, LA 1.3. CDiff(-) 2/16, re-checked 2/24 and was negative as well.  New CVC line (2/25), PIV line (2/25)  Best Practices: SCDs, mouthcare, Hep SQ, PPI   TPN Access: Triple Lumen IJ - changed to rt. Femoral triple lumen (2/25) TPN day#:16; (12/17/14 >> )  Plan:  -Decrease Clinimix with LYTES 5/15 to 17600mL/hr to provide a total of 120 gm protein and 1700kcal.  Adding Pivot 1.5kcal and protein makes total of 165 gm protein, and 2400 kcal.(little higher than recommended) - Omit Lipids today  - Will keep supplemental sodium to TPN bag today as hyponatremia has improved. -Daily MVI and TE in TPN -Continue SSI and maintain 15 units in TPN -MIVF as per MD orders  -F/U BMET with am labs  Nadara MustardNita Marquisha Nikolov, PharmD., MS Clinical Pharmacist Pager:  [phone] Thank you for allowing pharmacy to be part of this patients care team. 01/02/2015 8:44 AM

## 2015-01-02 NOTE — Progress Notes (Signed)
PULMONARY / CRITICAL CARE MEDICINE   Name: Kyle West MRN: [id number] DOB: 14-Feb-1951    ADMISSION DATE:  12/21/2014   REFERRING MD :  OSH  CHIEF COMPLAINT:  abd pain   INITIAL PRESENTATION:  64yo male with hx HTN, CAD s/p CABG, cirrhosis of unclear etiology (followed at [facility]), reported recent hx pancreatitis.  Presented to OSH 2/11 with 2 day hx worsening abd pain, nausea and vomiting.  CT at outside hospital revealed pneumatosis, hepatic and portal venous gas and infrarenal AAA.  He was tx to [facility] and taken to OR 2/11 for exp lap r/t ischemic bowel/SBO.  Course c/b Afib with RVR and abd wound dehiscence on 2/18 with return to OR.  On 2/25 developed AMS, worsening hypotension and acidosis and PCCM re-consulted.   MAJOR EVENTS: 2/11 exploratory laparotomy, small bowel resection, application of wound vac (Dr Brantley Stage). Left intubated post op. Post op hypotension requiring CVL placement, volume resuscitation, vasopressors 2/12 sinus tachycardia, new onset AFRVR. Vasopressors changed from NE to PE. Low dose metoprolol initiated 2/13 back to OR, side-to-side small bowel anastomosis & closure of fascia.  Persistent AFib with RVR. Levo added. 2/15 extubated, off pressors 2/16 d/c amiodarone 2/18 Abd wound dehiscence, Return to OR  2/25 AMS, acidotic, hypotensive, tx ICU and PCCM re consulted.   2/27 off 3% NS  TESTS: 2/11 CT abd/pelvis >> pneumatosis SB with moderate ascites, infrarenal AAA, cirrhosis 2/27 Echo >> EF 55 to 78%, grade 1 diastolic dysfx  SUBJECTIVE:  Remains on pressors.  VITAL SIGNS: Temp:  [98.2 F (36.8 C)-99 F (37.2 C)] 98.9 F (37.2 C) (02/28 0400) Pulse Rate:  [95-105] 101 (02/28 0700) Resp:  [16-28] 16 (02/28 0700) BP: (102-112)/(37-40) 102/37 mmHg (02/27 2000) SpO2:  [99 %-100 %] 100 % (02/28 0700) Arterial Line BP: (99-149)/(36-52) 111/39 mmHg (02/28 0700) FiO2 (%):  [40 %] 40 % (02/28 0600) Weight:  [251 lb 5.2 oz (114 kg)] 251 lb 5.2 oz (114 kg)  (02/28 0447) INTAKE / OUTPUT:  Intake/Output Summary (Last 24 hours) at 01/02/15 0806 Last data filed at 01/02/15 0700  Gross per 24 hour  Intake 6638.57 ml  Output   4600 ml  Net 2038.57 ml    PHYSICAL EXAMINATION: General: no distress Neuro:  RASS -1 HEENT: ETT and OGT in place Cardiovascular: regular Lungs: scattered rhonchi Abdomen:  Soft, wound site clean Ext: 1+ edema  LABS:  CBC  Recent Labs Lab 12/31/14 0520 01/01/15 0330 01/02/15 0345  WBC 41.4* 26.6* 23.1*  HGB 8.6* 7.8* 7.7*  HCT 23.7* 21.5* 21.6*  PLT 236 195 185    BMET  Recent Labs Lab 01/01/15 2100 01/02/15 0001 01/02/15 0345  NA 124* 126* 127*  K 3.7 3.8 3.7  CL 94* 97 97  CO2 25 25 24   BUN 95* 94* 89*  CREATININE 2.09* 2.03* 1.99*  GLUCOSE 180* 187* 201*   Electrolytes  Recent Labs Lab 12/29/14 0601 12/30/14 0430  12/30/14 1915 12/31/14 0520  01/01/15 2100 01/02/15 0001 01/02/15 0345  CALCIUM 7.0* 7.1*  < > 6.6* 6.4*  < > 6.6* 6.6* 6.7*  MG 2.2 1.9  --   --  1.6  --   --   --   --   PHOS 4.7* 5.6*  --  5.8* 5.3*  --   --   --   --   < > = values in this interval not displayed.    Hepatic Function Latest Ref Rng 01/02/2015 01/01/2015 12/31/2014  Total Protein 6.0 - 8.3  g/dL 3.3(L) 3.3(L) 3.8(L)  Albumin 3.5 - 5.2 g/dL 1.0(L) 1.0(L) 1.3(L)  AST 0 - 37 U/L 121(H) 136(H) 130(H)  ALT 0 - 53 U/L 49 57(H) 53  Alk Phosphatase 39 - 117 U/L 114 116 111  Total Bilirubin 0.3 - 1.2 mg/dL 2.5(H) 2.3(H) 2.3(H)  Bilirubin, Direct 0.0 - 0.5 mg/dL - - -   Sepsis Markers  Recent Labs Lab 12/27/14 0437 12/28/14 0425 12/29/14 0601 12/29/14 0924 12/30/14 0900  LATICACIDVEN  --  1.0  --  1.3 1.5  PROCALCITON 10.29  --  5.71  --   --      ABG  Recent Labs Lab 12/30/14 1605 12/31/14 0921 01/02/15 0346  PHART 7.214* 7.340* 7.353  PCO2ART 37.1 36.4 41.9  PO2ART 354.0* 105.0* 124.0*   Liver Enzymes  Recent Labs Lab 12/31/14 0520 01/01/15 0330 01/02/15 0345  AST 130* 136*  121*  ALT 53 57* 49  ALKPHOS 111 116 114  BILITOT 2.3* 2.3* 2.5*  ALBUMIN 1.3* 1.0* 1.0*   Cardiac Enzymes  Recent Labs Lab 12/30/14 0917 12/30/14 1400 12/30/14 1915  TROPONINI <0.03 0.06* 0.05*   Glucose  Recent Labs Lab 01/01/15 1227 01/01/15 1713 01/01/15 2007 01/02/15 0027 01/02/15 0344 01/02/15 0755  GLUCAP 196* 197* 188* 206* 193* 173*    Dg Abd Portable 1v  01/02/2015   CLINICAL DATA:  Feeding tube placement. Feeding tube advanced, goal is post pyloric.  EXAM: PORTABLE ABDOMEN - 1 VIEW  COMPARISON:  1 hour prior  FINDINGS: The weighted enteric tube is been advanced, tip to the right of midline, likely in the first portion of the duodenum. This is likely post pyloric.  IMPRESSION: Tip of the weighted enteric tube to the right of midline, likely in the first portion of the duodenum   Electronically Signed   By: Jeb Levering M.D.   On: 01/02/2015 01:26   Dg Abd Portable 1v  01/02/2015   CLINICAL DATA:  Feeding tube placement.  EXAM: PORTABLE ABDOMEN - 1 VIEW  COMPARISON:  7 hours prior.  FINDINGS: The weighted enteric tube is been advanced, tip now in the region of the gastric body. There is contrast within the descending colon.  IMPRESSION: Tip of the enteric tube in the body of the stomach.   Electronically Signed   By: Jeb Levering M.D.   On: 01/02/2015 00:42   Dg Abd Portable 1v  01/01/2015   CLINICAL DATA:  NG tube placement  EXAM: PORTABLE ABDOMEN - 1 VIEW  COMPARISON:  12/29/2014  FINDINGS: Feeding tube is in place with the tip directed were the left lateral abdominal wall within the gastric fundus.  IMPRESSION: Feeding tube tip in the fundus of the stomach.   Electronically Signed   By: Rolm Baptise M.D.   On: 01/01/2015 18:00     ASSESSMENT / PLAN:  PULMONARY ETT 2/11 >> 2/15 ETT 2/25 >> A: Acute respiratory failure - r/t sepsis +/- hepatic encephalopathy + metabolic acidosis. Hx of OSA. P:   Full vent support F/u CXR Scheduled  duoneb  CARDIOVASCULAR Rt femoral CVL 2/25 >> Rt radial aline 2/27 >> A: Shock likely from sepsis and hypovolemia. PAF with RVR -- converted to NSR 2/13. AAA - nondissecting. Hx of CAD s/p CABG, HTN, HLD. P:  Wean pressors to keep SBP > 90 MAP > 65  RENAL A:   AKI suspect due to volume losses from open abdomen, considering also sepsis. Lactic acidosis. Hyponatremia.  P:   F/u BMET oracit per renal  GASTROINTESTINAL A:   Post laparotomy with bowel resection, repeat OR for fascial closure 2/13, wound dehiscence 2/18. Ascites >> large volume fluid losses from abdominal wound. Hx of alcoholic/NASH cirrhosis >> followed at [facility]. Diarrhea >> C diff negative. P:   SUP: antoprazole TPN, wound care per surgery  Advance tube feeds per surgery Hold outpt aldactone, chronulac, xifaxan for now F/u LFT's intermittently  HEMATOLOGIC A:   Anemia, thrombocytopenia of critical illness and chronic disease. P:  SQ heparin for DVT prevention F/u CBC  INFECTIOUS A:   Septic shock 2nd to peritonitis. New sepsis 2/25 -- doubt abd source with benign abd exam, CT abd 2/24 without evidence ischemia, abscesses or recurrent obstruction.   P:   CDiff 2/16>>>  negative Urine 2/20>>>neg BCx2 2/25>>>  Zosyn 2/20>>> vanc 2/25>>>2/27  ENDOCRINE A:   Stress induced hyperglycemia P:   Continue SSI while on TPN  NEUROLOGIC A:   Hx of depression. Post-op pain control. Deconditioning. P:   Sedation per protocol RASS goal -1  CC time 35 minutes.  Chesley Mires, MD [facility] Pulmonary/Critical Care 01/02/2015, 8:06 AM Pager:  [phone] After 3pm call: [phone]

## 2015-01-02 NOTE — Progress Notes (Signed)
10 Days Post-Op  Subjective: On vent  Objective: Vital signs in last 24 hours: Temp:  [97.6 F (36.4 C)-99 F (37.2 C)] 97.6 F (36.4 C) (02/28 0800) Pulse Rate:  [95-105] 102 (02/28 0800) Resp:  [16-28] 16 (02/28 0800) BP: (102-112)/(37-40) 102/37 mmHg (02/27 2000) SpO2:  [99 %-100 %] 100 % (02/28 0800) Arterial Line BP: (99-149)/(35-52) 109/35 mmHg (02/28 0800) FiO2 (%):  [40 %] 40 % (02/28 0800) Weight:  [251 lb 5.2 oz (114 kg)] 251 lb 5.2 oz (114 kg) (02/28 0447) Last BM Date: 12/30/14  Intake/Output from previous day: 02/27 0701 - 02/28 0700 In: 6923.3 [I.V.:3996.6; NG/GT:92.7; IV Piggyback:200; UJW:1191]TPN:2634] Out: 4675 [Urine:1625; Stool:3050] Intake/Output this shift: Total I/O In: 296.9 [I.V.:156.9; NG/GT:20; TPN:120] Out: 175 [Urine:175]  GI: wound with retentions and ascitic leak captured by Eakin's pouch  Lab Results:   Recent Labs  01/01/15 0330 01/02/15 0345  WBC 26.6* 23.1*  HGB 7.8* 7.7*  HCT 21.5* 21.6*  PLT 195 185   BMET  Recent Labs  01/02/15 0001 01/02/15 0345  NA 126* 127*  K 3.8 3.7  CL 97 97  CO2 25 24  GLUCOSE 187* 201*  BUN 94* 89*  CREATININE 2.03* 1.99*  CALCIUM 6.6* 6.7*   PT/INR No results for input(s): LABPROT, INR in the last 72 hours. ABG  Recent Labs  12/31/14 0921 01/02/15 0346  PHART 7.340* 7.353  HCO3 19.6* 22.7    Studies/Results: Dg Chest Port 1 View  01/02/2015   CLINICAL DATA:  Respiratory failure  EXAM: PORTABLE CHEST - 1 VIEW  COMPARISON:  12/30/2014  FINDINGS: Endotracheal tube is 2.8 cm from carinal. Feeding tube extends to the stomach. Normal cardiac silhouette. There is bibasilar atelectasis. Small effusions.  IMPRESSION: 1. Endotracheal tube and feeding tube are in good position. 2. Bibasilar atelectasis and small effusions.   Electronically Signed   By: Genevive BiStewart  Edmunds M.D.   On: 01/02/2015 08:09   Dg Abd Portable 1v  01/02/2015   CLINICAL DATA:  Feeding tube placement. Feeding tube advanced, goal is  post pyloric.  EXAM: PORTABLE ABDOMEN - 1 VIEW  COMPARISON:  1 hour prior  FINDINGS: The weighted enteric tube is been advanced, tip to the right of midline, likely in the first portion of the duodenum. This is likely post pyloric.  IMPRESSION: Tip of the weighted enteric tube to the right of midline, likely in the first portion of the duodenum   Electronically Signed   By: Rubye OaksMelanie  Ehinger M.D.   On: 01/02/2015 01:26   Dg Abd Portable 1v  01/02/2015   CLINICAL DATA:  Feeding tube placement.  EXAM: PORTABLE ABDOMEN - 1 VIEW  COMPARISON:  7 hours prior.  FINDINGS: The weighted enteric tube is been advanced, tip now in the region of the gastric body. There is contrast within the descending colon.  IMPRESSION: Tip of the enteric tube in the body of the stomach.   Electronically Signed   By: Rubye OaksMelanie  Ehinger M.D.   On: 01/02/2015 00:42   Dg Abd Portable 1v  01/01/2015   CLINICAL DATA:  NG tube placement  EXAM: PORTABLE ABDOMEN - 1 VIEW  COMPARISON:  12/29/2014  FINDINGS: Feeding tube is in place with the tip directed were the left lateral abdominal wall within the gastric fundus.  IMPRESSION: Feeding tube tip in the fundus of the stomach.   Electronically Signed   By: Charlett NoseKevin  Dover M.D.   On: 01/01/2015 18:00    Anti-infectives: Anti-infectives    Start  Dose/Rate Route Frequency Ordered Stop   12/30/14 1000  vancomycin (VANCOCIN) 1,250 mg in sodium chloride 0.9 % 250 mL IVPB  Status:  Discontinued     1,250 mg 166.7 mL/hr over 90 Minutes Intravenous Every 24 hours 12/30/14 0939 01/01/15 0645   12/26/14 0930  vancomycin (VANCOCIN) 1,250 mg in sodium chloride 0.9 % 250 mL IVPB  Status:  Discontinued     1,250 mg 166.7 mL/hr over 90 Minutes Intravenous Every 24 hours 12/25/14 0912 12/28/14 0850   12/25/14 1000  piperacillin-tazobactam (ZOSYN) IVPB 3.375 g     3.375 g 12.5 mL/hr over 240 Minutes Intravenous Every 8 hours 12/25/14 0921     12/25/14 0930  vancomycin (VANCOCIN) 1,750 mg in sodium  chloride 0.9 % 500 mL IVPB     1,750 mg 250 mL/hr over 120 Minutes Intravenous  Once 12/25/14 0912 12/25/14 1208   12/25/14 0900  piperacillin-tazobactam (ZOSYN) IVPB 3.375 g  Status:  Discontinued     3.375 g 100 mL/hr over 30 Minutes Intravenous 3 times per day 12/25/14 0852 12/25/14 0921   12/20/2014 2200  [MAR Hold]  rifaximin (XIFAXAN) tablet 550 mg  Status:  Discontinued     (MAR Hold since 12/24/2014 1721)   550 mg Oral 2 times daily 12/06/2014 1634 12/06/2014 1726   Dec 18, 2014 1430  piperacillin-tazobactam (ZOSYN) IVPB 3.375 g     3.375 g 12.5 mL/hr over 240 Minutes Intravenous 3 times per day December 18, 2014 1403 12/24/14 0347      Assessment/Plan: POD 17/16/10, s/p ex lap with open abdomen and SBR, s/p relook and closure, s/p ex lap and closure with retentions for dehiscence -continue eakin's pouch to measure peritoneal output and keep up with fluid loss -last CT OK -Tol TF VDRF and hepatorenal failure - per CCM  LOS: 17 days    Racer Quam E 01/02/2015

## 2015-01-02 NOTE — Progress Notes (Signed)
Assessment/Plan: 1 CKD 3(Cr 1.5) and AKI 2 Hyponatremia due to hypotonic fluids, improving s/p 3% NS and TNA chg and sodium citrate.  3 S/P bowel resection 4 Cirrhosis  P Continue volume support with isotonic sodium chloride IV,  sodium citrate via NG and isotonic sodium in TNA per pharmacy  Objective: Vital signs in last 24 hours: Temp:  [97.6 F (36.4 C)-99 F (37.2 C)] 97.6 F (36.4 C) (02/28 0800) Pulse Rate:  [95-106] 106 (02/28 0900) Resp:  [14-28] 14 (02/28 0900) BP: (102-130)/(37-43) 130/43 mmHg (02/28 0855) SpO2:  [98 %-100 %] 98 % (02/28 0900) Arterial Line BP: (99-149)/(35-52) 136/44 mmHg (02/28 0900) FiO2 (%):  [40 %] 40 % (02/28 0855) Weight:  [114 kg (251 lb 5.2 oz)] 114 kg (251 lb 5.2 oz) (02/28 0447) Weight change: 3.2 kg (7 lb 0.9 oz)  Intake/Output from previous day: 02/27 0701 - 02/28 0700 In: 6923.3 [I.V.:3996.6; NG/GT:92.7; IV Piggyback:200; ZOX:0960] Out: 4675 [Urine:1625; Stool:3050] Intake/Output this shift: Total I/O In: 589.5 [I.V.:309.5; NG/GT:40; TPN:240] Out: 175 [Urine:175]  General appearance: intubated and sedated GI: post op  No edema Skin turgor ok  Lab Results:  Recent Labs  01/01/15 0330 01/02/15 0345  WBC 26.6* 23.1*  HGB 7.8* 7.7*  HCT 21.5* 21.6*  PLT 195 185   BMET:  Recent Labs  01/02/15 0001 01/02/15 0345  NA 126* 127*  K 3.8 3.7  CL 97 97  CO2 25 24  GLUCOSE 187* 201*  BUN 94* 89*  CREATININE 2.03* 1.99*  CALCIUM 6.6* 6.7*   No results for input(s): PTH in the last 72 hours. Iron Studies:  Recent Labs  12/31/14 0520  IRON 18*  TIBC 157*   Studies/Results: Dg Chest Port 1 View  01/02/2015   CLINICAL DATA:  Respiratory failure  EXAM: PORTABLE CHEST - 1 VIEW  COMPARISON:  12/30/2014  FINDINGS: Endotracheal tube is 2.8 cm from carinal. Feeding tube extends to the stomach. Normal cardiac silhouette. There is bibasilar atelectasis. Small effusions.  IMPRESSION: 1. Endotracheal tube and feeding tube are in  good position. 2. Bibasilar atelectasis and small effusions.   Electronically Signed   By: Genevive Bi M.D.   On: 01/02/2015 08:09   Dg Abd Portable 1v  01/02/2015   CLINICAL DATA:  Feeding tube placement. Feeding tube advanced, goal is post pyloric.  EXAM: PORTABLE ABDOMEN - 1 VIEW  COMPARISON:  1 hour prior  FINDINGS: The weighted enteric tube is been advanced, tip to the right of midline, likely in the first portion of the duodenum. This is likely post pyloric.  IMPRESSION: Tip of the weighted enteric tube to the right of midline, likely in the first portion of the duodenum   Electronically Signed   By: Rubye Oaks M.D.   On: 01/02/2015 01:26   Dg Abd Portable 1v  01/02/2015   CLINICAL DATA:  Feeding tube placement.  EXAM: PORTABLE ABDOMEN - 1 VIEW  COMPARISON:  7 hours prior.  FINDINGS: The weighted enteric tube is been advanced, tip now in the region of the gastric body. There is contrast within the descending colon.  IMPRESSION: Tip of the enteric tube in the body of the stomach.   Electronically Signed   By: Rubye Oaks M.D.   On: 01/02/2015 00:42   Dg Abd Portable 1v  01/01/2015   CLINICAL DATA:  NG tube placement  EXAM: PORTABLE ABDOMEN - 1 VIEW  COMPARISON:  12/29/2014  FINDINGS: Feeding tube is in place with the tip directed were the  left lateral abdominal wall within the gastric fundus.  IMPRESSION: Feeding tube tip in the fundus of the stomach.   Electronically Signed   By: Charlett NoseKevin  Dover M.D.   On: 01/01/2015 18:00   Scheduled: . antiseptic oral rinse  7 mL Mouth Rinse QID  . chlorhexidine  15 mL Mouth Rinse BID  . citric acid-sodium citrate  30 mL Oral QID  . feeding supplement (PIVOT 1.5 CAL)  1,000 mL Per Tube Q24H  . heparin subcutaneous  5,000 Units Subcutaneous 3 times per day  . insulin aspart  0-9 Units Subcutaneous 6 times per day  . ipratropium-albuterol  3 mL Nebulization Q6H  . pantoprazole sodium  40 mg Per Tube Q24H  . piperacillin-tazobactam (ZOSYN)  IV   3.375 g Intravenous Q8H     LOS: 17 days   Edilia Ghuman C 01/02/2015,9:42 AM

## 2015-01-02 NOTE — Progress Notes (Signed)
New left nare kangaroo tube placed at 70, xray obtained. Dr. Darrick Pennaeterding notified of results. Tube advanced to 85 for goal of post pyloric. Awaiting results of abd xray at this time. Dr. Darrick Pennaeterding okay with using feeding tube at this time.

## 2015-01-03 ENCOUNTER — Inpatient Hospital Stay (HOSPITAL_COMMUNITY): Payer: BLUE CROSS/BLUE SHIELD

## 2015-01-03 DIAGNOSIS — R6520 Severe sepsis without septic shock: Secondary | ICD-10-CM

## 2015-01-03 DIAGNOSIS — Z9889 Other specified postprocedural states: Secondary | ICD-10-CM

## 2015-01-03 LAB — CBC
HEMATOCRIT: 21.1 % — AB (ref 39.0–52.0)
Hemoglobin: 7.5 g/dL — ABNORMAL LOW (ref 13.0–17.0)
MCH: 33.3 pg (ref 26.0–34.0)
MCHC: 35.5 g/dL (ref 30.0–36.0)
MCV: 93.8 fL (ref 78.0–100.0)
PLATELETS: 160 10*3/uL (ref 150–400)
RBC: 2.25 MIL/uL — AB (ref 4.22–5.81)
RDW: 16.6 % — AB (ref 11.5–15.5)
WBC: 17.9 10*3/uL — ABNORMAL HIGH (ref 4.0–10.5)

## 2015-01-03 LAB — DIFFERENTIAL
BASOS ABS: 0.1 10*3/uL (ref 0.0–0.1)
BASOS PCT: 1 % (ref 0–1)
EOS ABS: 0.6 10*3/uL (ref 0.0–0.7)
EOS PCT: 4 % (ref 0–5)
Lymphocytes Relative: 6 % — ABNORMAL LOW (ref 12–46)
Lymphs Abs: 1 10*3/uL (ref 0.7–4.0)
MONOS PCT: 17 % — AB (ref 3–12)
Monocytes Absolute: 3.1 10*3/uL — ABNORMAL HIGH (ref 0.1–1.0)
NEUTROS ABS: 13 10*3/uL — AB (ref 1.7–7.7)
Neutrophils Relative %: 72 % (ref 43–77)

## 2015-01-03 LAB — GLUCOSE, CAPILLARY
GLUCOSE-CAPILLARY: 179 mg/dL — AB (ref 70–99)
Glucose-Capillary: 177 mg/dL — ABNORMAL HIGH (ref 70–99)
Glucose-Capillary: 180 mg/dL — ABNORMAL HIGH (ref 70–99)
Glucose-Capillary: 180 mg/dL — ABNORMAL HIGH (ref 70–99)
Glucose-Capillary: 188 mg/dL — ABNORMAL HIGH (ref 70–99)
Glucose-Capillary: 209 mg/dL — ABNORMAL HIGH (ref 70–99)

## 2015-01-03 LAB — COMPREHENSIVE METABOLIC PANEL
ALK PHOS: 154 U/L — AB (ref 39–117)
ALT: 48 U/L (ref 0–53)
AST: 117 U/L — AB (ref 0–37)
Albumin: <1 g/dL — ABNORMAL LOW (ref 3.5–5.2)
Anion gap: 4 — ABNORMAL LOW (ref 5–15)
BILIRUBIN TOTAL: 3 mg/dL — AB (ref 0.3–1.2)
BUN: 90 mg/dL — ABNORMAL HIGH (ref 6–23)
CALCIUM: 7.3 mg/dL — AB (ref 8.4–10.5)
CO2: 27 mmol/L (ref 19–32)
Chloride: 104 mmol/L (ref 96–112)
Creatinine, Ser: 1.82 mg/dL — ABNORMAL HIGH (ref 0.50–1.35)
GFR calc Af Amer: 44 mL/min — ABNORMAL LOW (ref >90)
GFR, EST NON AFRICAN AMERICAN: 38 mL/min — AB (ref >90)
Glucose, Bld: 204 mg/dL — ABNORMAL HIGH (ref 70–99)
Potassium: 3.5 mmol/L (ref 3.5–5.1)
Sodium: 135 mmol/L (ref 135–145)
Total Protein: 3.6 g/dL — ABNORMAL LOW (ref 6.0–8.3)

## 2015-01-03 LAB — PHOSPHORUS: Phosphorus: 6.2 mg/dL — ABNORMAL HIGH (ref 2.3–4.6)

## 2015-01-03 LAB — MAGNESIUM: MAGNESIUM: 2.1 mg/dL (ref 1.5–2.5)

## 2015-01-03 LAB — TRIGLYCERIDES: Triglycerides: 50 mg/dL (ref <150)

## 2015-01-03 LAB — PREALBUMIN

## 2015-01-03 MED ORDER — SODIUM CHLORIDE 0.9 % IV SOLN
0.0000 ug/h | INTRAVENOUS | Status: DC
  Administered 2015-01-03: 50 ug/h via INTRAVENOUS
  Filled 2015-01-03: qty 50

## 2015-01-03 MED ORDER — FENTANYL CITRATE 0.05 MG/ML IJ SOLN
25.0000 ug | INTRAMUSCULAR | Status: DC | PRN
  Administered 2015-01-04: 100 ug via INTRAVENOUS
  Filled 2015-01-03: qty 2

## 2015-01-03 MED ORDER — SODIUM CHLORIDE 0.9 % IJ SOLN
10.0000 mL | Freq: Two times a day (BID) | INTRAMUSCULAR | Status: DC
  Administered 2015-01-03 – 2015-01-06 (×6): 10 mL
  Administered 2015-01-07 (×2): 20 mL

## 2015-01-03 MED ORDER — INSULIN ASPART 100 UNIT/ML ~~LOC~~ SOLN
0.0000 [IU] | SUBCUTANEOUS | Status: DC
  Administered 2015-01-03 – 2015-01-05 (×16): 3 [IU] via SUBCUTANEOUS
  Administered 2015-01-06: 5 [IU] via SUBCUTANEOUS
  Administered 2015-01-06: 3 [IU] via SUBCUTANEOUS
  Administered 2015-01-06 (×2): 2 [IU] via SUBCUTANEOUS
  Administered 2015-01-06: 5 [IU] via SUBCUTANEOUS
  Administered 2015-01-07: 2 [IU] via SUBCUTANEOUS
  Administered 2015-01-07: 8 [IU] via SUBCUTANEOUS
  Administered 2015-01-07: 5 [IU] via SUBCUTANEOUS

## 2015-01-03 MED ORDER — TRACE MINERALS CR-CU-F-FE-I-MN-MO-SE-ZN IV SOLN
INTRAVENOUS | Status: AC
  Administered 2015-01-03: 18:00:00 via INTRAVENOUS
  Filled 2015-01-03: qty 2400

## 2015-01-03 MED ORDER — TRACE MINERALS CR-CU-F-FE-I-MN-MO-SE-ZN IV SOLN
INTRAVENOUS | Status: DC
  Filled 2015-01-03: qty 2400

## 2015-01-03 MED ORDER — SODIUM CHLORIDE 0.9 % IJ SOLN: 10.0000 mL | INTRAMUSCULAR | Status: DC | PRN

## 2015-01-03 MED ORDER — TRACE MINERALS CR-CU-F-FE-I-MN-MO-SE-ZN IV SOLN: INTRAVENOUS | Status: DC

## 2015-01-03 MED ORDER — DEXMEDETOMIDINE HCL IN NACL 400 MCG/100ML IV SOLN
0.2000 ug/kg/h | INTRAVENOUS | Status: DC
  Administered 2015-01-03: 0.3 ug/kg/h via INTRAVENOUS
  Administered 2015-01-03: 0.2 ug/kg/h via INTRAVENOUS
  Administered 2015-01-04: 0.3 ug/kg/h via INTRAVENOUS
  Administered 2015-01-04: 0.4 ug/kg/h via INTRAVENOUS
  Administered 2015-01-05: 0.3 ug/kg/h via INTRAVENOUS
  Filled 2015-01-03: qty 100
  Filled 2015-01-03: qty 50
  Filled 2015-01-03: qty 100
  Filled 2015-01-03: qty 50
  Filled 2015-01-03: qty 100

## 2015-01-03 NOTE — Consult Note (Addendum)
WOC wound consult note Reason for Consult: Changed Eakin pouch; previous one applied on 2/23. CCS following for assessment and plan of care to abd wound, they were at the bedside to assess wound during pouch change.  Wound type: Full thickness post-op abd wound to midline; retention sutures must remain in place for a few more weeks, according to CCS. Measurement: Approx 20X5X2cm Wound bed: Beefy red Drainage (amount, consistency, odor) Large amt yellow drainage; has previously drained 3000cc in bedside drainage bag, according to bedside nurse. Periwound: Retention sutures surrounding wound, skin macerated around wound edges to 5 cm surrounding wound.  Difficult to maintain pouch seal R/T sutures. Dressing procedure/placement/frequency: Applied large Eakin pouch to contain drainage. Supplies and directions left at bedside for staff nurses if leakage occurs. Please re-consult if further assistance is needed.  Thank-you,  Cammie Mcgeeawn Clydette Privitera MSN, RN, CWOCN, LoraneWCN-AP, CNS [phone]

## 2015-01-03 NOTE — Progress Notes (Signed)
Patient ID: Kyle West, male   DOB: [birth year], 64 y.o.   MRN: [id number] S:intubated/sedated O:BP 120/39 mmHg  Pulse 107  Temp(Src) 98.5 F (36.9 C) (Oral)  Resp 16  Ht  (1.854 m)  Wt 115.8 kg (255 lb 4.7 oz)  BMI 33.69 kg/m2  SpO2 98%  Intake/Output Summary (Last 24 hours) at 01/03/15 0746 Last data filed at 01/03/15 0600  Gross per 24 hour  Intake 6808.23 ml  Output   6475 ml  Net 333.23 ml   Intake/Output: I/O last 3 completed shifts: In: 10739.3 [I.V.:5852.8; NG/GT:612.7; IV Piggyback:250] Out: 8975 [Urine:2875; Stool:6100]  Intake/Output this shift:    Weight change: 1.8 kg (3 lb 15.5 oz) Gen:WM intubated/sedated CVS:no rub Resp:occ rhonchi XLK:GMWNUU pouch in place, ventral incision with serous drainage Ext:no edema   Recent Labs Lab 12/28/14 0425 12/29/14 0601 12/29/14 1000 12/30/14 0430  12/30/14 1915 12/31/14 0520  01/01/15 0330 01/01/15 1021 01/01/15 1415 01/01/15 1720 01/01/15 2100 01/02/15 0001 01/02/15 0345 01/03/15 0330  NA 128* 124*  --  122*  < > 120* 117*  < > 118* 119* 121* 123* 124* 126* 127* 135  K 4.2 4.0  --  3.7  < > 3.5 3.1*  < > 3.4* 3.2* 3.6 3.7 3.7 3.8 3.7 3.5  CL 106 105  --  99  < > 97 88*  < > 86* 85* 89* 92* 94* 97 97 104  CO2 17* 13*  --  14*  < > 11* 18*  < > GLUCOSE 145* 148*  --  179*  < > 147* 195*  < > 200* 203* 194* 196* 180* 187* 201* 204*  BUN 73* 72*  --  93*  < > 95* 95*  < > 93* 95* 93* 94* 95* 94* 89* 90*  CREATININE 1.51* 1.58*  --  2.28*  < > 2.28* 2.27*  < > 2.22* 2.18* 2.13* 2.15* 2.09* 2.03* 1.99* 1.82*  ALBUMIN  --   --  1.7* 1.4*  --  1.3* 1.3*  --  1.0*  --   --   --   --   --  1.0* <1.0*  CALCIUM 7.0* 7.0*  --  7.1*  < > 6.6* 6.4*  < > 6.6* 6.3* 6.5* 6.5* 6.6* 6.6* 6.7* 7.3*  PHOS 3.9 4.7*  --  5.6*  --  5.8* 5.3*  --   --   --   --   --   --   --   --  6.2*  AST  --   --  62* 72*  --  90* 130*  --  136*  --   --   --   --   --  121* 117*  ALT  --   --  29 34  --  39 53   --  57*  --   --   --   --   --  49 48  < > = values in this interval not displayed. Liver Function Tests:  Recent Labs Lab 01/01/15 0330 01/02/15 0345 01/03/15 0330  AST 136* 121* 117*  ALT 57* 49 48  ALKPHOS 116 114 154*  BILITOT 2.3* 2.5* 3.0*  PROT 3.3* 3.3* 3.6*  ALBUMIN 1.0* 1.0* <1.0*   No results for input(s): LIPASE, AMYLASE in the last 168 hours.  Recent Labs Lab 12/30/14 0800  AMMONIA 43*   CBC:  Recent Labs Lab 12/30/14 0744 12/30/14 1915 12/31/14 0520 01/01/15  0330 01/02/15 0345 01/03/15 0330  WBC 31.5* 43.9* 41.4* 26.6* 23.1* 17.9*  NEUTROABS 26.1*  --   --   --   --  13.0*  HGB 8.8* 8.4* 8.6* 7.8* 7.7* 7.5*  HCT 24.7* 23.3* 23.7* 21.5* 21.6* 21.1*  MCV 96.1 93.6 92.6 93.5 92.3 93.8  PLT 154 220 236 195 185 160   Cardiac Enzymes:  Recent Labs Lab 12/29/14 1000 12/30/14 0917 12/30/14 1400 12/30/14 1915  TROPONINI <0.03 <0.03 0.06* 0.05*   CBG:  Recent Labs Lab 01/02/15 1222 01/02/15 1548 01/02/15 1949 01/03/15 0001 01/03/15 0333  GLUCAP 213* 210* 172* 180* 209*    Iron Studies: No results for input(s): IRON, TIBC, TRANSFERRIN, FERRITIN in the last 72 hours. Studies/Results: Dg Chest Port 1 View  01/03/2015   CLINICAL DATA:  Respiratory failure  EXAM: PORTABLE CHEST - 1 VIEW  COMPARISON:  Portable chest x-ray of January 02, 2015  FINDINGS: Lungs are reasonably well inflated. The interstitial markings are less conspicuous today. The hemidiaphragms remain obscured. The cardiac silhouette remains enlarged. The central pulmonary vascularity is more distinct today. The endotracheal tube tip lies 3.9 cm above the carina. The esophagogastric tube tip projects below the inferior margin of the image.  IMPRESSION: Slight interval improvement in the appearance of the pulmonary interstitium consistent with resolving edema. There remain bibasilar atelectasis and small effusions.   Electronically Signed   By: David  [place]   On: 01/03/2015 07:09   Dg  Chest Port 1 View  01/02/2015   CLINICAL DATA:  Respiratory failure  EXAM: PORTABLE CHEST - 1 VIEW  COMPARISON:  12/30/2014  FINDINGS: Endotracheal tube is 2.8 cm from carinal. Feeding tube extends to the stomach. Normal cardiac silhouette. There is bibasilar atelectasis. Small effusions.  IMPRESSION: 1. Endotracheal tube and feeding tube are in good position. 2. Bibasilar atelectasis and small effusions.   Electronically Signed   By: Genevive Bi M.D.   On: 01/02/2015 08:09   Dg Abd Portable 1v  01/02/2015   CLINICAL DATA:  Feeding tube placement. Feeding tube advanced, goal is post pyloric.  EXAM: PORTABLE ABDOMEN - 1 VIEW  COMPARISON:  1 hour prior  FINDINGS: The weighted enteric tube is been advanced, tip to the right of midline, likely in the first portion of the duodenum. This is likely post pyloric.  IMPRESSION: Tip of the weighted enteric tube to the right of midline, likely in the first portion of the duodenum   Electronically Signed   By: Rubye Oaks M.D.   On: 01/02/2015 01:26   Dg Abd Portable 1v  01/02/2015   CLINICAL DATA:  Feeding tube placement.  EXAM: PORTABLE ABDOMEN - 1 VIEW  COMPARISON:  7 hours prior.  FINDINGS: The weighted enteric tube is been advanced, tip now in the region of the gastric body. There is contrast within the descending colon.  IMPRESSION: Tip of the enteric tube in the body of the stomach.   Electronically Signed   By: Rubye Oaks M.D.   On: 01/02/2015 00:42   Dg Abd Portable 1v  01/01/2015   CLINICAL DATA:  NG tube placement  EXAM: PORTABLE ABDOMEN - 1 VIEW  COMPARISON:  12/29/2014  FINDINGS: Feeding tube is in place with the tip directed were the left lateral abdominal wall within the gastric fundus.  IMPRESSION: Feeding tube tip in the fundus of the stomach.   Electronically Signed   By: Charlett Nose M.D.   On: 01/01/2015 18:00   . antiseptic oral rinse  7 mL Mouth Rinse QID  . chlorhexidine  15 mL Mouth Rinse BID  . feeding supplement (PIVOT 1.5  CAL)  1,000 mL Per Tube Q24H  . heparin subcutaneous  5,000 Units Subcutaneous 3 times per day  . insulin aspart  0-9 Units Subcutaneous 6 times per day  . ipratropium-albuterol  3 mL Nebulization Q6H  . pantoprazole sodium  40 mg Per Tube Q24H  . piperacillin-tazobactam (ZOSYN)  IV  3.375 g Intravenous Q8H    BMET    Component Value Date/Time   NA 135 01/03/2015 0330   K 3.5 01/03/2015 0330   CL 104 01/03/2015 0330   CO2 27 01/03/2015 0330   GLUCOSE 204* 01/03/2015 0330   BUN 90* 01/03/2015 0330   CREATININE 1.82* 01/03/2015 0330   CALCIUM 7.3* 01/03/2015 0330   GFRNONAA 38* 01/03/2015 0330   GFRAA 44* 01/03/2015 0330   CBC    Component Value Date/Time   WBC 17.9* 01/03/2015 0330   RBC 2.25* 01/03/2015 0330   HGB 7.5* 01/03/2015 0330   HCT 21.1* 01/03/2015 0330   PLT 160 01/03/2015 0330   MCV 93.8 01/03/2015 0330   MCH 33.3 01/03/2015 0330   MCHC 35.5 01/03/2015 0330   RDW 16.6* 01/03/2015 0330   LYMPHSABS 1.0 01/03/2015 0330   MONOABS 3.1* 01/03/2015 0330   EOSABS 0.6 01/03/2015 0330   BASOSABS 0.1 01/03/2015 0330     Assessment/Plan:  1. AKI/CKD- improving toward his baseline (1.3-1.5), decreased pressor requirements 2. Hyponatremia- improving with added solutes to TNA.  Will discontinue sodium citrate po and cont to follow serum Na levels.  Continue with IV NS and added solutes to TNA 3. Cirrhosis/ascites 4. VDRF-per PCCM 5. SIRS- weaning pressors as tolerated 6. S/p bowel resection- still with sig drainage from open wound, has eakin's pouch per CCS 7. Severe protein malnutrition- on tna 8. ABLA- follow and transfuse prn 9. Dispo- poor prognosis with multi-organ failure and severe malnutrition  Cressie Betzler A

## 2015-01-03 NOTE — Progress Notes (Signed)
Inpatient Diabetes Program Recommendations  AACE/ADA: New Consensus Statement on Inpatient Glycemic Control (2013)  Target Ranges:  Prepandial:   less than 140 mg/dL      Peak postprandial:   less than 180 mg/dL (1-2 hours)      Critically ill patients:  140 - 180 mg/dL   Results for Kyle JuniperHARGIS, [name] (MRN [id number]) as of 01/03/2015 09:11  Ref. Range 01/02/2015 07:55 01/02/2015 12:22 01/02/2015 15:48 01/02/2015 19:49 01/03/2015 00:01 01/03/2015 03:33 01/03/2015 07:53  Glucose-Capillary Latest Range: 70-99 mg/dL 119173 (H) 147213 (H) 829210 (H) 172 (H) 180 (H) 209 (H) 180 (H)   Current orders for Inpatient glycemic control: Novolog 0-9 units Q4H  Inpatient Diabetes Program Recommendations Insulin - TPN: Pharmacy, may want to consider increasing insulin added to TPN.  CBGs have ranged from 172-213 mg/dl and patient has received a total of Novolog 17 units for correction over the past 24 hours.  Thanks, Orlando PennerMarie Jiovanny Burdell, RN, MSN, CCRN, CDE Diabetes Coordinator Inpatient Diabetes Program [phone] (Team Pager) [phone] (AP office) [phone] [facility] office)

## 2015-01-03 NOTE — Progress Notes (Signed)
PARENTERAL NUTRITION CONSULT NOTE - FOLLOW UP  Pharmacy Consult for TPN Indication: Prolonged ileus  Allergies  Allergen Reactions  . Oxycodone Hives and Other (See Comments)    Hallucinations   Patient Measurements: Height: 6\' 1"  (185.4 cm) Weight: 255 lb 4.7 oz (115.8 kg) IBW/kg (Calculated) : 79.9  Vital Signs: Temp: 98.5 F (36.9 C) (02/29 0300) Temp Source: Oral (02/29 0300) BP: 120/39 mmHg (02/29 0400) Pulse Rate: 107 (02/29 0704) Intake/Output from previous day: 02/28 0701 - 02/29 0700 In: 6808.2 [I.V.:3908.4; NG/GT:520; IV Piggyback:150; TPN:2229.8] Out: 6475 [Urine:1875; Stool:4600]  Labs:  Recent Labs  01/01/15 0330 01/02/15 0345 01/03/15 0330  WBC 26.6* 23.1* 17.9*  HGB 7.8* 7.7* 7.5*  HCT 21.5* 21.6* 21.1*  PLT 195 185 160   Recent Labs  01/01/15 0330  01/02/15 0001 01/02/15 0345 01/03/15 0330  NA 118*  < > 126* 127* 135  K 3.4*  < > 3.8 3.7 3.5  CL 86*  < > 97 97 104  CO2 25  < > 25 24 27   GLUCOSE 200*  < > 187* 201* 204*  BUN 93*  < > 94* 89* 90*  CREATININE 2.22*  < > 2.03* 1.99* 1.82*  CALCIUM 6.6*  < > 6.6* 6.7* 7.3*  MG  --   --   --   --  2.1  PHOS  --   --   --   --  6.2*  PROT 3.3*  --   --  3.3* 3.6*  ALBUMIN 1.0*  --   --  1.0* <1.0*  AST 136*  --   --  121* 117*  ALT 57*  --   --  49 48  ALKPHOS 116  --   --  114 154*  BILITOT 2.3*  --   --  2.5* 3.0*  TRIG  --   --   --   --  50  < > = values in this interval not displayed. Estimated Creatinine Clearance: 54.7 mL/min (by C-G formula based on Cr of 1.82).   Recent Labs  01/02/15 1949 01/03/15 0001 01/03/15 0333  GLUCAP 172* 180* 209*   Insulin Requirements in the past 24 hours:  13 units sensitive SSI, 15 units insulin in TPN  Admit:  [name] transferred from outside hospital for concern of ischemic bowel. He underwent surgery on 2/11 - exploratory laparotomy, small bowel resection and application of a wound vac. TPN was started in anticipation of prolonged bowel  rest/ileus.   GI:  S/P ex. Lap with SBR on 2/11. Also history of pancreatitis and cirrhosis child pugh B. ZOX:[id number]:Lactulose, PPI, Rifaximin, spironolactione, Ursodiol. S/P side-to-side small bowel anastomosis and closure of abdomen on 2/13.Continues to have ascites from wound- wound in tact with Eakin pouch to contain drainage- serous, blood tinged with 2L last 24hr. +BS. Baseline prealbumin low at 6.4 down to 3.5 last week. Patient decompensated on 2/27 and calorie count and diet were stopped.  Endo: No history of diabetes. CBGs 172-210- has 15 units (receiving ~12 units) regular insulin in TPN bag along with SSI ordered.    Lytes: Changed TPN to include electrolytes and additional sodium. Na has trended up from 127 to 135 mmol/L today (8 mmol/24 hours); K 3.5, Mg 2.1, Phos 6.2. Sodium citrate discontinued   Renal: AKI s/p surgery which was resolving. SCr trended down to 1.82. UOP 0.7 mL/kg/hr. +664 mL yesterday  Pulm: Intubated, 40% FiO2   Cards: History of CAD. BP elevated, Tachy this morning. Metoprolol IV scheduled.  Did have episode of PAF after surgery which has since resolved.  Hepatobil: history of cirrhosis/NASH.alkp phos 154, AST (117),  ALT (48), Tbili at 3. INR 1.67 (2/13). TG 50.  Ammonia 27 (2/19; improved on lactulose- now stopped). Per RN, patient displaying s/s of jaundice (yellowing of eyes)  Neuro: Now with post anesthesia hepatic encephalopathy. PRN Haldol  ID: WBC down (17.9) - Zosyn for intra-abdominal coverage (2/11>>), vanc dc'd. Afebrile, PCT 5.7, LA 1.3. CDiff(-) 2/16, re-checked 2/24 and was negative as well.  New CVC line (2/25), PIV line (2/25)  Best Practices: SCDs, mouthcare, Hep SQ, PPI   TPN Access: Triple Lumen IJ - changed to rt. Femoral triple lumen (2/25) TPN day#:16; (12/17/14 >> )  Nutritional Goals: (per RD note on 2/22) 2200-2400 kcal, 120-135g of protein per day New Goals 2/26 per RD - Kcal 2177 and Protein of 160-180 gm  Current  Nutrition:  -Clinimix E 5/15 at 129ml/hr, providing 120g protein and 1700 kCal  - Pivot 1.5 at 71ml/hr (current rate provides ~ 45 gm protein and 720 kcal)  Plan:  -Clinimix with LYTES 5/15 at 126mL/hr to provide a total of 120 gm protein and 1700kcal.  Adding Pivot 1.5kcal and protein makes total of 165 gm protein, and 2400 kcal.(little higher than recommended) -Omit Lipids since patient is on tube feeds  -Per nephrology, continue sodium in TPN bag as isotonic  -Daily MVI and M/W/F TE in TPN to prevent further increase in bilirubin  -Continue SSI and increase insulin to 20 units in TPN -MIVF as per MD orders  -F/U BMET with am labs  Vinnie Level, PharmD., BCPS Clinical Pharmacist Phone [phone]

## 2015-01-03 NOTE — Progress Notes (Signed)
Peripherally Inserted Central Catheter/Midline Placement  The IV Nurse has discussed with the patient and/or persons authorized to consent for the patient, the purpose of this procedure and the potential benefits and risks involved with this procedure.  The benefits include less needle sticks, lab draws from the catheter and patient may be discharged home with the catheter.  Risks include, but not limited to, infection, bleeding, blood clot (thrombus formation), and puncture of an artery; nerve damage and irregular heat beat.  Alternatives to this procedure were also discussed.  PICC/Midline Placement Documentation        Stacie GlazeJoyce, Richie Vadala Horton 01/03/2015, 1:29 PM

## 2015-01-03 NOTE — Progress Notes (Addendum)
PULMONARY / CRITICAL CARE MEDICINE   Name: Kyle West MRN: [id number] DOB: 04/22/1951    ADMISSION DATE:  12/30/2014   REFERRING MD :  OSH  CHIEF COMPLAINT:  abd pain   INITIAL PRESENTATION:  64yo male with hx HTN, CAD s/p CABG, cirrhosis of unclear etiology [facility] reported recent hx pancreatitis.  Presented to OSH 2/11 with 2 day hx worsening abd pain, nausea and vomiting.  CT at outside hospital revealed pneumatosis, hepatic and portal venous gas and infrarenal AAA.  [facility] and taken to OR 2/11 for exp lap r/t ischemic bowel/SBO.  Course c/b Afib with RVR and abd wound dehiscence on 2/18 with return to OR.  On 2/25 developed AMS, worsening hypotension and acidosis and PCCM re-consulted.   MAJOR EVENTS: 2/11 exploratory laparotomy, small bowel resection, application of wound vac (Dr [name]). Left intubated post op. Post op hypotension requiring CVL placement, volume resuscitation, vasopressors 2/12 sinus tachycardia, new onset AFRVR. Vasopressors changed from NE to PE. Low dose metoprolol initiated 2/13 back to OR, side-to-side small bowel anastomosis & closure of fascia.  Persistent AFib with RVR. Levo added. 2/15 extubated, off pressors 2/16 d/c amiodarone 2/18 Abd wound dehiscence, Return to OR  2/25 AMS, acidotic, hypotensive, tx ICU and PCCM re consulted.   2/27 off 3% NS 2/29 Dexmedetomidine initiated  TESTS: 2/11 CT abd/pelvis >> pneumatosis SB with moderate ascites, infrarenal AAA, cirrhosis 2/24 CTAP: No findings specific for bowel ischemia on this noncontrast examination. No pneumatosis. Scattered areas of small bowel and colonic wall thickening,No evidence of intra abdominal abscess. Small volume pneumoperitoneum with scattered foci of free air beneath the anterior abdominal wall, likely related to recent laparotomy. 4.2 cm infrarenal abdominal aortic aneurysm. Cirrhosis 2/27 Echo >> EF 55 to 60%, grade 1 diastolic dysfx  SUBJECTIVE:  RASS -4.  Not F/C. No SBT performed  VITAL SIGNS: Temp:  [97.3 F (36.3 C)-98.5 F (36.9 C)] 98 F (36.7 C) (02/29 1158) Pulse Rate:  [99-118] 105 (02/29 1300) Resp:  [13-21] 14 (02/29 1300) BP: (118-147)/(33-51) 120/39 mmHg (02/29 0400) SpO2:  [95 %-100 %] 100 % (02/29 1300) Arterial Line BP: (116-161)/(32-55) 123/40 mmHg (02/29 1300) FiO2 (%):  [40 %] 40 % (02/29 1158) Weight:  [115.8 kg (255 lb 4.7 oz)] 115.8 kg (255 lb 4.7 oz) (02/29 0459) INTAKE / OUTPUT:  Intake/Output Summary (Last 24 hours) at 01/03/15 1410 Last data filed at 01/03/15 1300  Gross per 24 hour  Intake 6273.1 ml  Output   5950 ml  Net  323.1 ml    PHYSICAL EXAMINATION: General: sedated, intubated Neuro:  RASS -4, MAEs HEENT: WNL Cardiovascular: reg, no M Lungs: clear anteriorly  Abdomen:  Soft, wound site clean Ext: 2+ BUE edema, no LE edema  LABS: I have reviewed all of today's lab results. Relevant abnormalities are discussed in the A/P section  CXR: elevated R hemidiaphragm, small L effusion   ASSESSMENT / PLAN:  PULMONARY ETT 2/11 >> 2/15 ETT 2/25 >> A: Acute respiratory failure, recurrent Hx of OSA. P:   Cont full vent support - settings reviewed and/or adjusted Cont vent bundle Daily SBT if/when meets criteria  CARDIOVASCULAR Rt femoral CVL 2/25 >> 2/29 (ordered) PICC 2/29 (ordered) >>  Rt radial A-line 2/27 >> A: Shock - multifactorial PAF, RVR > NSR AAA - nondissecting. Hx of CAD s/p CABG, HTN, HLD. P:  Cont NE as needed to maintain MAP > 65 mmHg  RENAL A:   AKI Lactic acidosis, resolved  Hyponatremia, resolved P:   Monitor BMET intermittently Monitor I/Os Correct electrolytes as indicated  GASTROINTESTINAL A:   Ischemic/infarcted bowel Laparotomy with bowel resection 2/11 Laparotomy for fascial closure 2/13  Wound dehiscence 2/18 Cirrhosis - EtOH/NASH Ascites P:   SUP: Pantoprazole Cont TPN Cont trickle feeds per CCS Post op and wound mgmt per  CCS  HEMATOLOGIC A:   Anemia without overt bleeding Thrombocytopenia, resolved P:  DVT px: SQ heparin Monitor CBC intermittently Transfuse per usual ICU guidelines   INFECTIOUS A:   Septic shock 2nd to peritonitis New severe sepsis 2/25, of unclear etiology P:   All micro reviewed  vanc 2/25>>>2/27 Zosyn 2/20 >> 2/29  Complete 10 days   ENDOCRINE A:   Stress induced hyperglycemia P:   Continue SSI while on TPN  NEUROLOGIC A:   Hx of depression Post-op pain Deconditioning Agitated delirium P:   RASS goal: -1 Begin dexmedetomidine  Wean fentanyl as tolerated  Family updated @ bedside  Hopefully, can achieve calm wakefulness and proceed toward extubation in next 24-48 hrs  CC time 40 minutes.  Billy Fischer, MD ; [facility] [phone].  After 5:30 PM or weekends, call [phone]

## 2015-01-03 NOTE — Progress Notes (Signed)
NUTRITION FOLLOW UP  Intervention:   -TPN management per pharmacy -Continue trickle feedings of Pivot 1.5 @ 20 ml/hr via post-pyloric tube.  -Tube feeding regimen provides 720 kcal (33% of needs), 45 grams of protein, and 364 ml of H2O.  -Complete nutrition support regimen (TF and TPN) currently providing 2420 kcals and 165 grams protein (which meets 100% of minimum estimated kcal needs 97% of minimum estimated protein needs)  Nutrition Dx:   Inadequate oral intake related to inability to eat as evidenced by recent bowel surgery and clear liquid diet; ongoing  Goal:   Intake to meet >90% of estimated nutrition needs; not met.  Monitor:   TPN tolerance, vent status, ability to start enteral nutrition, labs, weight trend.  Assessment:   64yo male with hx HTN, CAD s/p CABG, cirrhosis, reported recent hx pancreatitis. Presented with 2 day hx worsening abd pain, nausea and vomiting.  SP ex lap with small bowel resection due to ischemic bowel and application of wound VAC on 2/11.  Pt tx to 2H due to trouble breathing, pt intubated 2/25 now in septic shock.Pt started on TPN 2/12 due to ileus.   Patient is currently intubated on ventilator support MV: 10.6 L/min Temp (24hrs), Avg:97.9 F (36.6 C), Min:97.3 F (36.3 C), Max:98.5 F (36.9 C)  TPN regimen (Clinimix with LYTES 5/15) was decreased to 14m/hr. Supplemental sodium has been added to TPN due to hyponatremia.  TPN regimen provides a total of 1700 kcal and 120 gm protein.   Post pyloric tube was placed and trickle feeds initiated on 01/02/15- good position confirmed by CXR. Pivot 1.5 is infusing @ 20 ml/hr (which provides 720 kcals, 45 grams protein, and 364 ml fluid). Staff report that pt is tolerating TF well.   Pt remains with eakin's pouch to measure peritoneal output and keep up with fluid loss. Output remains less than 1.5 L (01/02/15 1100 ml). [facility]   Labs  reviewed. Na: 126, BUN/Creat: 94/2.03, Calcium: 6.6, Phos: 5.3, Glucose: 187. CBGS: 108-209.  Mg and K WDL.   Height: Ht Readings from Last 1 Encounters:  12/28/14 6' 1"  (1.854 m)    Weight Status:   Wt Readings from Last 1 Encounters:  01/03/15 255 lb 4.7 oz (115.8 kg)   12/31/14 244 lb 4.3 oz (110.8 kg)    12/21/14 252 lb 10.4 oz (114.6 kg)       12/17/14 235 lb 14.3 oz (107 kg)           Re-estimated needs:  Kcal: 2204.4 Protein: 170-185 grams Fluid: >2.2 L  Skin: SDTI on rt nose, stage II pressure ulcer on bilateral buttocks, closed abdominal incision  Diet Order: Diet NPO time specified .TPN (CLINIMIX-E) Adult   Intake/Output Summary (Last 24 hours) at 01/03/15 1009 Last data filed at 01/03/15 0940  Gross per 24 hour  Intake 6930.1 ml  Output   6600 ml  Net  330.1 ml    Last BM: 12/30/14   Labs:   Recent Labs Lab 12/30/14 0430  12/30/14 1915 12/31/14 0520  01/02/15 0001 01/02/15 0345 01/03/15 0330  NA 122*  < > 120* 117*  < > 126* 127* 135  K 3.7  < > 3.5 3.1*  < > 3.8 3.7 3.5  CL 99  < > 97 88*  < > 97 97 104  CO2 14*  < > 11* 18*  < > 25 24 27   BUN 93*  < >  95* 95*  < > 94* 89* 90*  CREATININE 2.28*  < > 2.28* 2.27*  < > 2.03* 1.99* 1.82*  CALCIUM 7.1*  < > 6.6* 6.4*  < > 6.6* 6.7* 7.3*  MG 1.9  --   --  1.6  --   --   --  2.1  PHOS 5.6*  --  5.8* 5.3*  --   --   --  6.2*  GLUCOSE 179*  < > 147* 195*  < > 187* 201* 204*  < > = values in this interval not displayed.  CBG (last 3)   Recent Labs  01/03/15 0001 01/03/15 0333 01/03/15 0753  GLUCAP 180* 209* 180*    Scheduled Meds: . antiseptic oral rinse  7 mL Mouth Rinse QID  . chlorhexidine  15 mL Mouth Rinse BID  . feeding supplement (PIVOT 1.5 CAL)  1,000 mL Per Tube Q24H  . heparin subcutaneous  5,000 Units Subcutaneous 3 times per day  . insulin aspart  0-15 Units Subcutaneous 6 times per day  . ipratropium-albuterol  3 mL Nebulization Q6H  . pantoprazole  sodium  40 mg Per Tube Q24H  . piperacillin-tazobactam (ZOSYN)  IV  3.375 g Intravenous Q8H    Continuous Infusions: . Marland KitchenTPN (CLINIMIX-E) Adult 100 mL/hr at 01/03/15 0800  . sodium chloride 10 mL/hr at 01/01/15 2012  . dexmedetomidine    . fentaNYL infusion INTRAVENOUS    . norepinephrine (LEVOPHED) Adult infusion 16 mcg/min (01/03/15 0900)    Georgina Krist A. Jimmye Norman, RD, LDN, CDE Pager: [phone] After hours Pager: [phone]

## 2015-01-03 NOTE — Progress Notes (Signed)
11 Days Post-Op  Subjective: On ventilator  Objective: Vital signs in last 24 hours: Temp:  [97.3 F (36.3 C)-98.5 F (36.9 C)] 97.5 F (36.4 C) (02/29 0800) Pulse Rate:  [99-111] 107 (02/29 0800) Resp:  [14-18] 18 (02/29 0800) BP: (111-147)/(33-51) 120/39 mmHg (02/29 0400) SpO2:  [97 %-100 %] 98 % (02/29 0800) Arterial Line BP: (106-152)/(32-55) 134/42 mmHg (02/29 0800) FiO2 (%):  [40 %] 40 % (02/29 0800) Weight:  [255 lb 4.7 oz (115.8 kg)] 255 lb 4.7 oz (115.8 kg) (02/29 0459) Last BM Date: 12/30/14  Intake/Output from previous day: 02/28 0701 - 02/29 0700 In: 7102 [I.V.:4082.2; NG/GT:540; IV Piggyback:150; TPN:2329.8] Out: 6475 [Urine:1875; Stool:4600] Intake/Output this shift: Total I/O In: 293.8 [I.V.:173.8; NG/GT:20; TPN:100] Out: 300 [Urine:300]  GI: retention sutures in place, incision dehisced mostly inferior but appears to be draining peritoneal fluid throughout, there is maceration around incision where drainage is occurring  Lab Results:   Recent Labs  01/02/15 0345 01/03/15 0330  WBC 23.1* 17.9*  HGB 7.7* 7.5*  HCT 21.6* 21.1*  PLT 185 160   BMET  Recent Labs  01/02/15 0345 01/03/15 0330  NA 127* 135  K 3.7 3.5  CL 97 104  CO2 24 27  GLUCOSE 201* 204*  BUN 89* 90*  CREATININE 1.99* 1.82*  CALCIUM 6.7* 7.3*   PT/INR No results for input(s): LABPROT, INR in the last 72 hours. ABG  Recent Labs  12/31/14 0921 01/02/15 0346  PHART 7.340* 7.353  HCO3 19.6* 22.7    Studies/Results: Dg Chest Port 1 View  01/03/2015   CLINICAL DATA:  Respiratory failure  EXAM: PORTABLE CHEST - 1 VIEW  COMPARISON:  Portable chest x-ray of January 02, 2015  FINDINGS: Lungs are reasonably well inflated. The interstitial markings are less conspicuous today. The hemidiaphragms remain obscured. The cardiac silhouette remains enlarged. The central pulmonary vascularity is more distinct today. The endotracheal tube tip lies 3.9 cm above the carina. The  esophagogastric tube tip projects below the inferior margin of the image.  IMPRESSION: Slight interval improvement in the appearance of the pulmonary interstitium consistent with resolving edema. There remain bibasilar atelectasis and small effusions.   Electronically Signed   By: David  Swazil[place]Jordan   On: 01/03/2015 07:09   Dg Chest Port 1 View  01/02/2015   CLINICAL DATA:  Respiratory failure  EXAM: PORTABLE CHEST - 1 VIEW  COMPARISON:  12/30/2014  FINDINGS: Endotracheal tube is 2.8 cm from carinal. Feeding tube extends to the stomach. Normal cardiac silhouette. There is bibasilar atelectasis. Small effusions.  IMPRESSION: 1. Endotracheal tube and feeding tube are in good position. 2. Bibasilar atelectasis and small effusions.   Electronically Signed   By: Genevive BiStewart  Edmunds M.D.   On: 01/02/2015 08:09   Dg Abd Portable 1v  01/02/2015   CLINICAL DATA:  Feeding tube placement. Feeding tube advanced, goal is post pyloric.  EXAM: PORTABLE ABDOMEN - 1 VIEW  COMPARISON:  1 hour prior  FINDINGS: The weighted enteric tube is been advanced, tip to the right of midline, likely in the first portion of the duodenum. This is likely post pyloric.  IMPRESSION: Tip of the weighted enteric tube to the right of midline, likely in the first portion of the duodenum   Electronically Signed   By: Rubye OaksMelanie  Ehinger M.D.   On: 01/02/2015 01:26   Dg Abd Portable 1v  01/02/2015   CLINICAL DATA:  Feeding tube placement.  EXAM: PORTABLE ABDOMEN - 1 VIEW  COMPARISON:  7 hours prior.  FINDINGS: The weighted enteric tube is been advanced, tip now in the region of the gastric body. There is contrast within the descending colon.  IMPRESSION: Tip of the enteric tube in the body of the stomach.   Electronically Signed   By: Rubye Oaks M.D.   On: 01/02/2015 00:42   Dg Abd Portable 1v  01/01/2015   CLINICAL DATA:  NG tube placement  EXAM: PORTABLE ABDOMEN - 1 VIEW  COMPARISON:  12/29/2014  FINDINGS: Feeding tube is in place with the tip  directed were the left lateral abdominal wall within the gastric fundus.  IMPRESSION: Feeding tube tip in the fundus of the stomach.   Electronically Signed   By: Charlett Nose M.D.   On: 01/01/2015 18:00    Anti-infectives: Anti-infectives    Start     Dose/Rate Route Frequency Ordered Stop   12/30/14 1000  vancomycin (VANCOCIN) 1,250 mg in sodium chloride 0.9 % 250 mL IVPB  Status:  Discontinued     1,250 mg 166.7 mL/hr over 90 Minutes Intravenous Every 24 hours 12/30/14 0939 01/01/15 0645   12/26/14 0930  vancomycin (VANCOCIN) 1,250 mg in sodium chloride 0.9 % 250 mL IVPB  Status:  Discontinued     1,250 mg 166.7 mL/hr over 90 Minutes Intravenous Every 24 hours 12/25/14 0912 12/28/14 0850   12/25/14 1000  piperacillin-tazobactam (ZOSYN) IVPB 3.375 g     3.375 g 12.5 mL/hr over 240 Minutes Intravenous Every 8 hours 12/25/14 0921     12/25/14 0930  vancomycin (VANCOCIN) 1,750 mg in sodium chloride 0.9 % 500 mL IVPB     1,750 mg 250 mL/hr over 120 Minutes Intravenous  Once 12/25/14 0912 12/25/14 1208   12/25/14 0900  piperacillin-tazobactam (ZOSYN) IVPB 3.375 g  Status:  Discontinued     3.375 g 100 mL/hr over 30 Minutes Intravenous 3 times per day 12/25/14 0852 12/25/14 0921   12/21/2014 2200  [MAR Hold]  rifaximin (XIFAXAN) tablet 550 mg  Status:  Discontinued     (MAR Hold since 12/16/2014 1721)   550 mg Oral 2 times daily 01/02/2015 1634 12/17/2014 1726   12/30/2014 1430  piperacillin-tazobactam (ZOSYN) IVPB 3.375 g     3.375 g 12.5 mL/hr over 240 Minutes Intravenous 3 times per day 12/19/2014 1403 12/24/14 0347      Assessment/Plan: POD 18/17/11, s/p ex lap with open abdomen and SBR, s/p relook and closure, s/p ex lap and closure with retentions for dehiscence -continue eakin's pouch to measure peritoneal output and keep up with fluid loss, would leave retention sutures in place, appreciate WOC assistanct -last CT was wnl on 2/24 -Tol tube feeds VDRF and hepatorenal failure - per CCM  LOS:  17 days   [facility] 01/03/2015

## 2015-01-04 ENCOUNTER — Inpatient Hospital Stay (HOSPITAL_COMMUNITY): Payer: BLUE CROSS/BLUE SHIELD

## 2015-01-04 LAB — CBC
HCT: 21 % — ABNORMAL LOW (ref 39.0–52.0)
HEMOGLOBIN: 7.4 g/dL — AB (ref 13.0–17.0)
MCH: 33.3 pg (ref 26.0–34.0)
MCHC: 35.2 g/dL (ref 30.0–36.0)
MCV: 94.6 fL (ref 78.0–100.0)
Platelets: 151 10*3/uL (ref 150–400)
RBC: 2.22 MIL/uL — AB (ref 4.22–5.81)
RDW: 16.8 % — AB (ref 11.5–15.5)
WBC: 18 10*3/uL — AB (ref 4.0–10.5)

## 2015-01-04 LAB — GLUCOSE, CAPILLARY
GLUCOSE-CAPILLARY: 171 mg/dL — AB (ref 70–99)
GLUCOSE-CAPILLARY: 172 mg/dL — AB (ref 70–99)
GLUCOSE-CAPILLARY: 172 mg/dL — AB (ref 70–99)
Glucose-Capillary: 156 mg/dL — ABNORMAL HIGH (ref 70–99)
Glucose-Capillary: 187 mg/dL — ABNORMAL HIGH (ref 70–99)
Glucose-Capillary: 174 mg/dL — ABNORMAL HIGH (ref 70–99)

## 2015-01-04 LAB — BASIC METABOLIC PANEL
ANION GAP: 10 (ref 5–15)
BUN: 105 mg/dL — ABNORMAL HIGH (ref 6–23)
CALCIUM: 7.6 mg/dL — AB (ref 8.4–10.5)
CO2: 22 mmol/L (ref 19–32)
Chloride: 111 mmol/L (ref 96–112)
Creatinine, Ser: 1.99 mg/dL — ABNORMAL HIGH (ref 0.50–1.35)
GFR calc Af Amer: 39 mL/min — ABNORMAL LOW (ref >90)
GFR, EST NON AFRICAN AMERICAN: 34 mL/min — AB (ref >90)
GLUCOSE: 180 mg/dL — AB (ref 70–99)
POTASSIUM: 4.3 mmol/L (ref 3.5–5.1)
SODIUM: 143 mmol/L (ref 135–145)

## 2015-01-04 LAB — RENAL FUNCTION PANEL
Albumin: <1 g/dL — ABNORMAL LOW (ref 3.5–5.2)
Anion gap: 6 (ref 5–15)
BUN: 112 mg/dL — ABNORMAL HIGH (ref 6–23)
CALCIUM: 7.6 mg/dL — AB (ref 8.4–10.5)
CO2: 24 mmol/L (ref 19–32)
CREATININE: 2.12 mg/dL — AB (ref 0.50–1.35)
Chloride: 115 mmol/L — ABNORMAL HIGH (ref 96–112)
GFR calc Af Amer: 36 mL/min — ABNORMAL LOW (ref >90)
GFR, EST NON AFRICAN AMERICAN: 31 mL/min — AB (ref >90)
Glucose, Bld: 185 mg/dL — ABNORMAL HIGH (ref 70–99)
Phosphorus: 6.9 mg/dL — ABNORMAL HIGH (ref 2.3–4.6)
Potassium: 4.8 mmol/L (ref 3.5–5.1)
Sodium: 145 mmol/L (ref 135–145)

## 2015-01-04 LAB — PROCALCITONIN: PROCALCITONIN: 5.18 ng/mL

## 2015-01-04 LAB — PHOSPHORUS: PHOSPHORUS: 6.9 mg/dL — AB (ref 2.3–4.6)

## 2015-01-04 LAB — MAGNESIUM: Magnesium: 2.3 mg/dL (ref 1.5–2.5)

## 2015-01-04 MED ORDER — TRACE MINERALS CR-CU-F-FE-I-MN-MO-SE-ZN IV SOLN
INTRAVENOUS | Status: DC
  Filled 2015-01-04: qty 2400

## 2015-01-04 MED ORDER — SODIUM CHLORIDE 4 MEQ/ML IV SOLN
INTRAVENOUS | Status: AC
  Administered 2015-01-04: 17:00:00 via INTRAVENOUS
  Filled 2015-01-04: qty 2400

## 2015-01-04 MED ORDER — SODIUM CHLORIDE 4 MEQ/ML IV SOLN: INTRAVENOUS | Status: DC

## 2015-01-04 NOTE — Progress Notes (Signed)
   01/04/15 1621  Vent Select  Invasive or Noninvasive Invasive  Adult Vent Y  Airway 7.5 mm  Placement Date/Time: 12/30/14 1235   Placed By: ICU physician  Airway Device: Endotracheal Tube  Laryngoscope Blade: MAC;4  ETT Types: Subglottic  Size (mm): 7.5 mm  Cuffed: Cuffed  Insertion attempts: 2  Airway Equipment: Stylet;Video Laryngoscope  P...  Secured at (cm) 25 cm  Measured From Lips  Secured Location Left  Secured By Brewing technologistCommercial Tube Holder  Tube Holder Repositioned Yes  Adult Ventilator Settings  Vent Type Servo i  Humidity HME  Vent Mode PRVC  Vt Set 600 mL  Set Rate 14 bmp  FiO2 (%) 30 %  I Time 0.9 Sec(s)  I:E Ratio Set 1:1.5  PEEP 5 cmH20  Adult Ventilator Measurements  Peak Airway Pressure 15 L/min  Mean Airway Pressure 8 cmH20  Resp Rate Spontaneous 6 br/min  Resp Rate Total 20 br/min  Exhaled Vt 575 mL  Measured Ve 12.7 mL  HOB> 30 Degrees Y  Adult Ventilator Alarms  Alarms On Y  Ve High Alarm 18 L/min  Ve Low Alarm 5 L/min  Resp Rate High Alarm 30 br/min  Resp Rate Low Alarm 10  PEEP Low Alarm 3 cmH2O  Press High Alarm 45 cmH2O  Daily Weaning Assessment  Reason SBT Terminated Excessive agitation, marked accessory muscle use, abdominal paradox, or diaphoresis noted;RR > 35 breaths/min or Frequency/TV ratio >105  Patient weaned 5/5 most of the day.  [facility] due to accessory muscle use

## 2015-01-04 NOTE — Progress Notes (Signed)
Patient ID: Kyle West, male   DOB: [birth year], 64 y.o.   MRN: [id number] 12 Days Post-Op  Subjective: Pt on vent.  Tolerating trickle tube feeds  Objective: Vital signs in last 24 hours: Temp:  [97.5 F (36.4 C)-98.7 F (37.1 C)] 98.5 F (36.9 C) (03/01 0400) Pulse Rate:  [95-118] 96 (03/01 0700) Resp:  [13-21] 14 (03/01 0700) SpO2:  [95 %-100 %] 100 % (03/01 0700) Arterial Line BP: (76-161)/(27-55) 91/34 mmHg (03/01 0700) FiO2 (%):  [30 %-40 %] 30 % (03/01 0313) Weight:  [252 lb 6.8 oz (114.5 kg)] 252 lb 6.8 oz (114.5 kg) (03/01 0259) Last BM Date: 12/30/14  Intake/Output from previous day: 02/29 0701 - 03/01 0700 In: 3996.5 [I.V.:1016.5; NG/GT:480; IV Piggyback:100; TPN:2400] Out: 4925 [Urine:1625; Stool:3300] Intake/Output this shift:    PE: Abd: soft, anasarca, +BS, wound with Eakin's pouch in place with serous drainage.  Retention sutures in place.  Skin edges are macerated.  Lab Results:   Recent Labs  01/03/15 0330 01/04/15 0340  WBC 17.9* 18.0*  HGB 7.5* 7.4*  HCT 21.1* 21.0*  PLT 160 151   BMET  Recent Labs  01/03/15 0330 01/04/15 0340  NA 135 143  K 3.5 4.3  CL 104 111  CO2 27 22  GLUCOSE 204* 180*  BUN 90* 105*  CREATININE 1.82* 1.99*  CALCIUM 7.3* 7.6*   PT/INR No results for input(s): LABPROT, INR in the last 72 hours. CMP     Component Value Date/Time   NA 143 01/04/2015 0340   K 4.3 01/04/2015 0340   CL 111 01/04/2015 0340   CO2 22 01/04/2015 0340   GLUCOSE 180* 01/04/2015 0340   BUN 105* 01/04/2015 0340   CREATININE 1.99* 01/04/2015 0340   CALCIUM 7.6* 01/04/2015 0340   PROT 3.6* 01/03/2015 0330   ALBUMIN <1.0* 01/03/2015 0330   AST 117* 01/03/2015 0330   ALT 48 01/03/2015 0330   ALKPHOS 154* 01/03/2015 0330   BILITOT 3.0* 01/03/2015 0330   GFRNONAA 34* 01/04/2015 0340   GFRAA 39* 01/04/2015 0340   Lipase     Component Value Date/Time   LIPASE 51 05/06/2015 1325       Studies/Results: Dg Chest Port 1  View  01/04/2015   CLINICAL DATA:  Respiratory failure.  Shortness of breath.  EXAM: PORTABLE CHEST - 1 VIEW  COMPARISON:  01/03/2015.  FINDINGS: Interim placement of right PICC line, its tip is in the superior vena cava. Endotracheal tube and feeding tube in good anatomic position. Prior CABG. Stable cardiomegaly with normal pulmonary vascularity. Interim continued clearing of pulmonary edema. Persistent bibasilar atelectasis and/or edema remain. Small left pleural effusion cannot be excluded. No pneumothorax.  IMPRESSION: 1. Interim placement of right PICC line, its tip is in the superior vena cava. 2. Endotracheal tube and feeding tube in stable position. 3. Interim continued clearing of pulmonary edema with bibasilar infiltrates and atelectasis remaining. 4. Prior CABG. Stable cardiomegaly. Pulmonary vascularity is normal.   Electronically Signed   By: Maisie Fushomas  Register   On: 01/04/2015 07:24   Dg Chest Port 1 View  01/03/2015   CLINICAL DATA:  Respiratory failure  EXAM: PORTABLE CHEST - 1 VIEW  COMPARISON:  Portable chest x-ray of January 02, 2015  FINDINGS: Lungs are reasonably well inflated. The interstitial markings are less conspicuous today. The hemidiaphragms remain obscured. The cardiac silhouette remains enlarged. The central pulmonary vascularity is more distinct today. The endotracheal tube tip lies 3.9 cm above the carina. The esophagogastric tube tip projects  below the inferior margin of the image.  IMPRESSION: Slight interval improvement in the appearance of the pulmonary interstitium consistent with resolving edema. There remain bibasilar atelectasis and small effusions.   Electronically Signed   By: David  [place]   On: 01/03/2015 07:09    Anti-infectives: Anti-infectives    Start     Dose/Rate Route Frequency Ordered Stop   12/30/14 1000  vancomycin (VANCOCIN) 1,250 mg in sodium chloride 0.9 % 250 mL IVPB  Status:  Discontinued     1,250 mg 166.7 mL/hr over 90 Minutes Intravenous Every  24 hours 12/30/14 0939 01/01/15 0645   12/26/14 0930  vancomycin (VANCOCIN) 1,250 mg in sodium chloride 0.9 % 250 mL IVPB  Status:  Discontinued     1,250 mg 166.7 mL/hr over 90 Minutes Intravenous Every 24 hours 12/25/14 0912 12/28/14 0850   12/25/14 1000  piperacillin-tazobactam (ZOSYN) IVPB 3.375 g     3.375 g 12.5 mL/hr over 240 Minutes Intravenous Every 8 hours 12/25/14 0921 01/03/15 2359   12/25/14 0930  vancomycin (VANCOCIN) 1,750 mg in sodium chloride 0.9 % 500 mL IVPB     1,750 mg 250 mL/hr over 120 Minutes Intravenous  Once 12/25/14 0912 12/25/14 1208   12/25/14 0900  piperacillin-tazobactam (ZOSYN) IVPB 3.375 g  Status:  Discontinued     3.375 g 100 mL/hr over 30 Minutes Intravenous 3 times per day 12/25/14 0852 12/25/14 0921   12/29/2014 2200  [MAR Hold]  rifaximin (XIFAXAN) tablet 550 mg  Status:  Discontinued     (MAR Hold since 12/27/2014 1721)   550 mg Oral 2 times daily 12/19/2014 1634 12/22/2014 1726   12/28/2014 1430  piperacillin-tazobactam (ZOSYN) IVPB 3.375 g     3.375 g 12.5 mL/hr over 240 Minutes Intravenous 3 times per day 12-28-2014 1403 12/24/14 0347       Assessment/Plan   POD 19/18/12, s/p ex lap with open abdomen and SBR, s/p relook and closure, s/p ex lap and closure with retentions for dehiscence -continue eakin's pouch to measure peritoneal output and keep up with fluid loss, would leave retention sutures in place, appreciate WOC assistanct -Tol tube feeds, may advance VDRF and hepatorenal failure - per CCM  LOS: 19 days    Italy Warriner E 01/04/2015, 7:35 AM Pager: [phone]

## 2015-01-04 NOTE — Progress Notes (Signed)
PARENTERAL NUTRITION CONSULT NOTE - FOLLOW UP  Pharmacy Consult for TPN Indication: Prolonged ileus  Allergies  Allergen Reactions  . Oxycodone Hives and Other (See Comments)    Hallucinations   Patient Measurements: Height:  (185.4 cm) Weight: 252 lb 6.8 oz (114.5 kg) IBW/kg (Calculated) : 79.9  Vital Signs: Temp: 98.5 F (36.9 C) (03/01 0400) Temp Source: Oral (03/01 0400) Pulse Rate: 96 (03/01 0700) Intake/Output from previous day: 02/29 0701 - 03/01 0700 In: 3996.5 [I.V.:1016.5; NG/GT:480; IV Piggyback:100; TPN:2400] Out: 4925 [Urine:1625; Stool:3300]  Labs:  Recent Labs  01/02/15 0345 01/03/15 0330 01/04/15 0340  WBC 23.1* 17.9* 18.0*  HGB 7.7* 7.5* 7.4*  HCT 21.6* 21.1* 21.0*  PLT 185 160 151    Recent Labs  01/02/15 0345 01/03/15 0330 01/04/15 0340  NA 127* 135 143  K 3.7 3.5 4.3  CL 97 104 111  CO2 GLUCOSE 201* 204* 180*  BUN 89* 90* 105*  CREATININE 1.99* 1.82* 1.99*  CALCIUM 6.7* 7.3* 7.6*  MG  --  2.1 2.3  PHOS  --  6.2* 6.9*  PROT 3.3* 3.6*  --   ALBUMIN 1.0* <1.0*  --   AST 121* 117*  --   ALT 49 48  --   ALKPHOS 114 154*  --   BILITOT 2.5* 3.0*  --   PREALBUMIN  --  <3.0*  --   TRIG  --  50  --    Estimated Creatinine Clearance: 49.7 mL/min (by C-G formula based on Cr of 1.99).   Recent Labs  01/03/15 1911 01/03/15 2306 01/04/15 0339  GLUCAP 188* 156* 171*   Insulin Requirements in the past 24 hours:  12 units sensitive SSI, 20 units insulin in TPN  Admit:  [name] transferred from outside hospital for concern of ischemic bowel. He underwent surgery on 2/11 - exploratory laparotomy, small bowel resection and application of a wound vac. TPN was started in anticipation of prolonged bowel rest/ileus.   GI:  S/P ex. Lap with SBR on 2/11. Also history of pancreatitis and cirrhosis child pugh B. ZOX:[id number], PPI, Rifaximin, spironolactione, Ursodiol. S/P side-to-side small bowel anastomosis and closure of  abdomen on 2/13.Continues to have ascites from wound- wound in tact with Eakin pouch to contain drainage- serous, blood tinged with 2L last 24hr. +BS. Baseline prealbumin low at 6.4 and now < 3. Patient decompensated on 2/27 and calorie count and diet were stopped. Tolerating trickle tube feeds. Per surgery, planning to advance TF soon  Endo: No history of diabetes. CBGs improved to 156-177- has 20 units regular insulin in TPN bag along with SSI ordered.    Lytes: Changed TPN to include electrolytes and additional sodium. Na has trended up from 127>>135>>143 mmol/L today (8 mmol/24 hours); K 4.3, Mg 2.3, Phos 6.9. Sodium citrate discontinued   Renal: AKI s/p surgery which was resolving. SCr trended up to 1.99. UOP 0.6 mL/kg/hr. Net -778 mL yesterday  Pulm: Intubated, 30% FiO2   Cards: History of CAD. BP soft, HR controlled.On norepinephrine to maintain MAP > 65, Did have episode of PAF after surgery which has since resolved.  Hepatobil: history of cirrhosis/NASH.alkp phos 154, AST (117),  ALT (48), Tbili at 3. INR 1.67 (2/13). TG 50.  Ammonia 27 (2/19; improved on lactulose- now stopped). Per RN, patient displaying s/s of jaundice (yellowing of eyes)  Neuro: Intubated on IV fentanyl and started on Precedex with hopes to extubate soon. RASS -1, GCS 10   ID: WBC remains  elevated (18) - Zosyn for intra-abdominal coverage (2/11>>), vanc dc'd. Afebrile, PCT 5.7, LA 1.3. CDiff(-) 2/16, re-checked 2/24 and was negative as well.  New CVC line (2/25), PIV line (2/25)  Best Practices: SCDs, mouthcare, Hep SQ, PPI   TPN Access: Triple Lumen IJ - changed to rt. Femoral triple lumen (2/25) TPN day#:18; (12/17/14 >> )  Nutritional Goals: (per RD note on 2/22) 2200-2400 kcal, 120-135g of protein per day New Goals 2/26 per RD - Kcal 2177 and Protein of 160-180 gm  Current Nutrition:  -Clinimix E 5/15 at 13000ml/hr, providing 120g protein and 1700 kCal  - Pivot 1.5 at 320ml/hr (current rate  provides ~ 45 gm protein and 720 kcal)  Plan:  -Clinimix with LYTES 5/15 at 16500mL/hr to provide a total of 120 gm protein and 1700kcal. Pivot 1.5kcal and protein makes total of 165 gm protein, and 2400 kcal.(little higher than recommended) -Omit Lipids since patient is on tube feeds  -Per nephrology, continue sodium in TPN bag as isotonic  -Daily MVI and M/W/F TE in TPN to prevent further increase in bilirubin  -Continue SSI and increase insulin to 25 units in TPN -MIVF as per MD orders  -F/U BMET with am labs  Vinnie LevelBenjamin Atira Borello, PharmD., BCPS Clinical Pharmacist Phone [phone]

## 2015-01-04 NOTE — Consult Note (Signed)
WOC follow-up: Eakin pouch intact to abd wound with good seal and large amt yellow drainage.  Supplies and instructions at bedside for staff nurses if leakage occurs. Cammie Mcgeeawn Letetia Romanello MSN, RN, CWOCN, MangoWCN-AP, CNS [phone]

## 2015-01-04 NOTE — Progress Notes (Signed)
Patient ID: Kyle West, male   DOB: 23-Nov-1950, 64 y.o.   MRN: [id number] S:intubated and sedated O:BP 106/39 mmHg  Pulse 96  Temp(Src) 98.2 F (36.8 C) (Oral)  Resp 14  Ht  (1.854 m)  Wt 114.5 kg (252 lb 6.8 oz)  BMI 33.31 kg/m2  SpO2 100%  Intake/Output Summary (Last 24 hours) at 01/04/15 4098 Last data filed at 01/04/15 0800  Gross per 24 hour  Intake 3512.66 ml  Output   4725 ml  Net -1212.34 ml   Intake/Output: I/O last 3 completed shifts: In: 7725.9 [I.V.:3275.9; NG/GT:800; IV Piggyback:150] Out: 8575 [Urine:2575; Stool:6000]  Intake/Output this shift:  Total I/O In: 163.8 [I.V.:23.8; NG/GT:40; TPN:100] Out: 100 [Urine:100] Weight change: -1.3 kg (-2 lb 13.9 oz) Gen:WD WM intubated and unresponsive CVS:no rub Resp:decreased BS at bases JXB:JYNW wound with serous drainage, no erythema/purulence Ext:2+ pitting edema   Recent Labs Lab 12/29/14 0601 12/29/14 1000 12/30/14 0430  12/30/14 1915 12/31/14 0520  01/01/15 0330  01/01/15 1415 01/01/15 1720 01/01/15 2100 01/02/15 0001 01/02/15 0345 01/03/15 0330 01/04/15 0340  NA 124*  --  122*  < > 120* 117*  < > 118*  < > 121* 123* 124* 126* 127* 135 143  K 4.0  --  3.7  < > 3.5 3.1*  < > 3.4*  < > 3.6 3.7 3.7 3.8 3.7 3.5 4.3  CL 105  --  99  < > 97 88*  < > 86*  < > 89* 92* 94* 97 97 104 111  CO2 13*  --  14*  < > 11* 18*  < > 25  < > GLUCOSE 148*  --  179*  < > 147* 195*  < > 200*  < > 194* 196* 180* 187* 201* 204* 180*  BUN 72*  --  93*  < > 95* 95*  < > 93*  < > 93* 94* 95* 94* 89* 90* 105*  CREATININE 1.58*  --  2.28*  < > 2.28* 2.27*  < > 2.22*  < > 2.13* 2.15* 2.09* 2.03* 1.99* 1.82* 1.99*  ALBUMIN  --  1.7* 1.4*  --  1.3* 1.3*  --  1.0*  --   --   --   --   --  1.0* <1.0*  --   CALCIUM 7.0*  --  7.1*  < > 6.6* 6.4*  < > 6.6*  < > 6.5* 6.5* 6.6* 6.6* 6.7* 7.3* 7.6*  PHOS 4.7*  --  5.6*  --  5.8* 5.3*  --   --   --   --   --   --   --   --  6.2* 6.9*  AST  --  62* 72*  --   90* 130*  --  136*  --   --   --   --   --  121* 117*  --   ALT  --  29 34  --  39 53  --  57*  --   --   --   --   --  49 48  --   < > = values in this interval not displayed. Liver Function Tests:  Recent Labs Lab 01/01/15 0330 01/02/15 0345 01/03/15 0330  AST 136* 121* 117*  ALT 57* 49 48  ALKPHOS 116 114 154*  BILITOT 2.3* 2.5* 3.0*  PROT 3.3* 3.3* 3.6*  ALBUMIN 1.0* 1.0* <1.0*   No results for input(s): LIPASE,  AMYLASE in the last 168 hours.  Recent Labs Lab 12/30/14 0800  AMMONIA 43*   CBC:  Recent Labs Lab 12/30/14 0744  12/31/14 0520 01/01/15 0330 01/02/15 0345 01/03/15 0330 01/04/15 0340  WBC 31.5*  < > 41.4* 26.6* 23.1* 17.9* 18.0*  NEUTROABS 26.1*  --   --   --   --  13.0*  --   HGB 8.8*  < > 8.6* 7.8* 7.7* 7.5* 7.4*  HCT 24.7*  < > 23.7* 21.5* 21.6* 21.1* 21.0*  MCV 96.1  < > 92.6 93.5 92.3 93.8 94.6  PLT 154  < > 236 195 185 160 151  < > = values in this interval not displayed. Cardiac Enzymes:  Recent Labs Lab 12/29/14 1000 12/30/14 0917 12/30/14 1400 12/30/14 1915  TROPONINI <0.03 <0.03 0.06* 0.05*   CBG:  Recent Labs Lab 01/03/15 1639 01/03/15 1911 01/03/15 2306 01/04/15 0339 01/04/15 0756  GLUCAP 179* 188* 156* 171* 172*    Iron Studies: No results for input(s): IRON, TIBC, TRANSFERRIN, FERRITIN in the last 72 hours. Studies/Results: Dg Chest Port 1 View  01/04/2015   CLINICAL DATA:  Respiratory failure.  Shortness of breath.  EXAM: PORTABLE CHEST - 1 VIEW  COMPARISON:  01/03/2015.  FINDINGS: Interim placement of right PICC line, its tip is in the superior vena cava. Endotracheal tube and feeding tube in good anatomic position. Prior CABG. Stable cardiomegaly with normal pulmonary vascularity. Interim continued clearing of pulmonary edema. Persistent bibasilar atelectasis and/or edema remain. Small left pleural effusion cannot be excluded. No pneumothorax.  IMPRESSION: 1. Interim placement of right PICC line, its tip is in the superior  vena cava. 2. Endotracheal tube and feeding tube in stable position. 3. Interim continued clearing of pulmonary edema with bibasilar infiltrates and atelectasis remaining. 4. Prior CABG. Stable cardiomegaly. Pulmonary vascularity is normal.   Electronically Signed   By: Maisie Fushomas  Register   On: 01/04/2015 07:24   Dg Chest Port 1 View  01/03/2015   CLINICAL DATA:  Respiratory failure  EXAM: PORTABLE CHEST - 1 VIEW  COMPARISON:  Portable chest x-ray of January 02, 2015  FINDINGS: Lungs are reasonably well inflated. The interstitial markings are less conspicuous today. The hemidiaphragms remain obscured. The cardiac silhouette remains enlarged. The central pulmonary vascularity is more distinct today. The endotracheal tube tip lies 3.9 cm above the carina. The esophagogastric tube tip projects below the inferior margin of the image.  IMPRESSION: Slight interval improvement in the appearance of the pulmonary interstitium consistent with resolving edema. There remain bibasilar atelectasis and small effusions.   Electronically Signed   By: David  [place]   On: 01/03/2015 07:09   . antiseptic oral rinse  7 mL Mouth Rinse QID  . chlorhexidine  15 mL Mouth Rinse BID  . feeding supplement (PIVOT 1.5 CAL)  1,000 mL Per Tube Q24H  . heparin subcutaneous  5,000 Units Subcutaneous 3 times per day  . insulin aspart  0-15 Units Subcutaneous 6 times per day  . ipratropium-albuterol  3 mL Nebulization Q6H  . pantoprazole sodium  40 mg Per Tube Q24H  . sodium chloride  10-40 mL Intracatheter Q12H    BMET    Component Value Date/Time   NA 143 01/04/2015 0340   K 4.3 01/04/2015 0340   CL 111 01/04/2015 0340   CO2 22 01/04/2015 0340   GLUCOSE 180* 01/04/2015 0340   BUN 105* 01/04/2015 0340   CREATININE 1.99* 01/04/2015 0340   CALCIUM 7.6* 01/04/2015 0340   GFRNONAA 34*  01/04/2015 0340   GFRAA 39* 01/04/2015 0340   CBC    Component Value Date/Time   WBC 18.0* 01/04/2015 0340   RBC 2.22* 01/04/2015 0340    HGB 7.4* 01/04/2015 0340   HCT 21.0* 01/04/2015 0340   PLT 151 01/04/2015 0340   MCV 94.6 01/04/2015 0340   MCH 33.3 01/04/2015 0340   MCHC 35.2 01/04/2015 0340   RDW 16.8* 01/04/2015 0340   LYMPHSABS 1.0 01/03/2015 0330   MONOABS 3.1* 01/03/2015 0330   EOSABS 0.6 01/03/2015 0330   BASOSABS 0.1 01/03/2015 0330     Assessment/Plan:  1. AKI/CKD- improving toward his baseline (1.3-1.5), decreased pressor requirements 2. Hyponatremia- improving with added solutes to TNA.  1. Discontinued sodium citrate po yesterday but had hypotension and increased pressors  2. Stop IV NS and cont with added sodium to TNA and follow serum Na levels.  3. 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  Will need to discuss with surgery if it is safe for him to have lactulose enema vs. Via NGT 4. VDRF-per PCCM 5. SIRS- weaning pressors as tolerated 6. S/p bowel resection- still with sig drainage from open wound, has eakin's pouch per CCS 7. Severe protein malnutrition- on tna 8. ABLA- follow and transfuse prn 9. Dispo- poor prognosis with multi-organ failure and severe malnutrition  Arian Mcquitty A

## 2015-01-04 NOTE — Progress Notes (Addendum)
PULMONARY / CRITICAL CARE MEDICINE   Name: Kyle West MRN: [id number] DOB: 08/08/1951    ADMISSION DATE:  Jan 01, 2015   REFERRING MD :  OSH  CHIEF COMPLAINT:  abd pain   INITIAL PRESENTATION:  64yo male with hx HTN, CAD s/p CABG, cirrhosis of unclear etiology (followed at [facility]), reported recent hx pancreatitis.  Presented to OSH 2/11 with 2 day hx worsening abd pain, nausea and vomiting.  CT at outside hospital revealed pneumatosis, hepatic and portal venous gas and infrarenal AAA.  He was tx to [facility] and taken to OR 2/11 for exp lap r/t ischemic bowel/SBO.  Course c/b Afib with RVR and abd wound dehiscence on 2/18 with return to OR.  On 2/25 developed AMS, worsening hypotension and acidosis and PCCM re-consulted.   MAJOR EVENTS: 2/11 exploratory laparotomy, small bowel resection, application of wound vac (Dr Luisa Hart). Left intubated post op. Post op hypotension requiring CVL placement, volume resuscitation, vasopressors 2/12 sinus tachycardia, new onset AFRVR. Vasopressors changed from NE to PE. Low dose metoprolol initiated 2/13 back to OR, side-to-side small bowel anastomosis & closure of fascia.  Persistent AFib with RVR. Levo added. 2/15 extubated, off pressors 2/16 d/c amiodarone 2/18 Abd wound dehiscence, Return to OR  2/25 AMS, acidotic, hypotensive, tx ICU and PCCM re consulted.   2/27 off 3% NS 2/29 Dexmedetomidine initiated  TESTS: 2/11 CT abd/pelvis >> pneumatosis SB with moderate ascites, infrarenal AAA, cirrhosis 2/24 CTAP: No findings specific for bowel ischemia on this noncontrast examination. No pneumatosis. Scattered areas of small bowel and colonic wall thickening,No evidence of intra abdominal abscess. Small volume pneumoperitoneum with scattered foci of free air beneath the anterior abdominal wall, likely related to recent laparotomy. 4.2 cm infrarenal abdominal aortic aneurysm. Cirrhosis 2/27 Echo >> EF 55 to 60%, grade 1 diastolic dysfx  SUBJECTIVE:  RASS -2,  -3. Not F/C. Tolerates SBT  VITAL SIGNS: Temp:  [97.7 F (36.5 C)-98.7 F (37.1 C)] 98.7 F (37.1 C) (03/01 1500) Pulse Rate:  [95-109] 109 (03/01 1500) Resp:  [13-20] 20 (03/01 1500) BP: (106-109)/(39-44) 109/44 mmHg (03/01 1156) SpO2:  [98 %-100 %] 100 % (03/01 1500) Arterial Line BP: (76-162)/(27-65) 121/47 mmHg (03/01 1500) FiO2 (%):  [30 %-40 %] 30 % (03/01 1500) Weight:  [114.5 kg (252 lb 6.8 oz)] 114.5 kg (252 lb 6.8 oz) (03/01 0259) INTAKE / OUTPUT:  Intake/Output Summary (Last 24 hours) at 01/04/15 1545 Last data filed at 01/04/15 1400  Gross per 24 hour  Intake 3603.02 ml  Output   5025 ml  Net -1421.98 ml    PHYSICAL EXAMINATION: General: sedated, intubated Neuro:  RASS -3, MAEs HEENT: WNL Cardiovascular: reg, no M Lungs: clear anteriorly  Abdomen:  Soft, wound site clean Ext: 2+ BUE edema, no LE edema  LABS: I have reviewed all of today's lab results. Relevant abnormalities are discussed in the A/P section  CXR: improving edema, persistent bibasilar atx   ASSESSMENT / PLAN:  PULMONARY ETT 2/11 >> 2/15 ETT 2/25 >> A: Acute respiratory failure, recurrent Hx of OSA. P:   Cont vent support - settings reviewed and/or adjusted Wean in PSV mode as tolerated Cont vent bundle Daily SBT if/when meets criteria  CARDIOVASCULAR Rt femoral CVL 2/25 >> 2/29 (ordered) PICC 2/29 (ordered) >>  Rt radial A-line 2/27 >> A: Shock - multifactorial PAF, RVR > NSR AAA - nondissecting. Hx of CAD s/p CABG, HTN, HLD. P:  Cont NE as needed to maintain MAP > 65 mmHg  RENAL A:  AKI Lactic acidosis, resolved Hyponatremia, resolved P:   Monitor BMET intermittently Monitor I/Os Correct electrolytes as indicated  GASTROINTESTINAL A:   Ischemic/infarcted bowel Laparotomy with bowel resection 2/11 Laparotomy for fascial closure 2/13  Wound dehiscence 2/18 Cirrhosis - EtOH/NASH Ascites P:   SUP: Pantoprazole Cont TPN Cont trickle feeds per CCS Post op  and wound mgmt per CCS  HEMATOLOGIC A:   Anemia without overt bleeding Thrombocytopenia, resolved P:  DVT px: SQ heparin Monitor CBC intermittently Transfuse per usual ICU guidelines   INFECTIOUS A:   Septic shock 2nd to peritonitis New severe sepsis 2/25, of unclear etiology Elevated PCT of unclear etiology P:   All micro reviewed  vanc 2/25>>>2/27 Zosyn 2/20 >> 2/29  Resp cx 3/01 >>    ENDOCRINE A:   Stress induced hyperglycemia P:   Continue SSI while on TPN  NEUROLOGIC A:   Hx of depression Post-op pain Deconditioning Agitated delirium, controlled Excessive sedation P:   RASS goal: -1 Cont dexmedetomidine  DC fent gtt PRN fentanyl  Family updated @ bedside  Hopefully, can achieve calm wakefulness and proceed toward extubation in next 24-48 hrs  CC time 35 minutes.  Billy Fischeravid Tai Skelly, MD ; [facility] service Mobile [phone].  After 5:30 PM or weekends, call [phone]

## 2015-01-04 NOTE — Progress Notes (Addendum)
Pt remp 101.2 orally,pt looks likes hes breathing harder  And hr in the near 120s ST at the beginning of this shift(1930).Pt was on SBT this am and fentanyl d/c-gave prn Fentanyl for comfort and MD made awre about pt temp.The temp inside the room is also warm-turn lower

## 2015-01-04 DEATH — deceased

## 2015-01-05 ENCOUNTER — Inpatient Hospital Stay (HOSPITAL_COMMUNITY): Payer: BLUE CROSS/BLUE SHIELD

## 2015-01-05 ENCOUNTER — Telehealth: Payer: Self-pay | Admitting: Surgery

## 2015-01-05 DIAGNOSIS — K7290 Hepatic failure, unspecified without coma: Secondary | ICD-10-CM

## 2015-01-05 LAB — COMPREHENSIVE METABOLIC PANEL
ALBUMIN: 1 g/dL — AB (ref 3.5–5.2)
ALT: 52 U/L (ref 0–53)
AST: 186 U/L — ABNORMAL HIGH (ref 0–37)
Alkaline Phosphatase: 201 U/L — ABNORMAL HIGH (ref 39–117)
Anion gap: 7 (ref 5–15)
BILIRUBIN TOTAL: 3.6 mg/dL — AB (ref 0.3–1.2)
BUN: 129 mg/dL — AB (ref 6–23)
CO2: 22 mmol/L (ref 19–32)
CREATININE: 2.41 mg/dL — AB (ref 0.50–1.35)
Calcium: 7.6 mg/dL — ABNORMAL LOW (ref 8.4–10.5)
Chloride: 115 mmol/L — ABNORMAL HIGH (ref 96–112)
GFR calc Af Amer: 31 mL/min — ABNORMAL LOW (ref >90)
GFR calc non Af Amer: 27 mL/min — ABNORMAL LOW (ref >90)
Glucose, Bld: 206 mg/dL — ABNORMAL HIGH (ref 70–99)
Potassium: 5 mmol/L (ref 3.5–5.1)
Sodium: 144 mmol/L (ref 135–145)
Total Protein: 3.8 g/dL — ABNORMAL LOW (ref 6.0–8.3)

## 2015-01-05 LAB — CBC
HCT: 22 % — ABNORMAL LOW (ref 39.0–52.0)
HEMOGLOBIN: 7.7 g/dL — AB (ref 13.0–17.0)
MCH: 33.8 pg (ref 26.0–34.0)
MCHC: 35 g/dL (ref 30.0–36.0)
MCV: 96.5 fL (ref 78.0–100.0)
Platelets: 145 10*3/uL — ABNORMAL LOW (ref 150–400)
RBC: 2.28 MIL/uL — ABNORMAL LOW (ref 4.22–5.81)
RDW: 17.3 % — AB (ref 11.5–15.5)
WBC: 17.5 10*3/uL — ABNORMAL HIGH (ref 4.0–10.5)

## 2015-01-05 LAB — CULTURE, BLOOD (ROUTINE X 2)
Culture: NO GROWTH
Culture: NO GROWTH

## 2015-01-05 LAB — GLUCOSE, CAPILLARY
GLUCOSE-CAPILLARY: 179 mg/dL — AB (ref 70–99)
GLUCOSE-CAPILLARY: 200 mg/dL — AB (ref 70–99)
Glucose-Capillary: 154 mg/dL — ABNORMAL HIGH (ref 70–99)
Glucose-Capillary: 164 mg/dL — ABNORMAL HIGH (ref 70–99)
Glucose-Capillary: 170 mg/dL — ABNORMAL HIGH (ref 70–99)
Glucose-Capillary: 179 mg/dL — ABNORMAL HIGH (ref 70–99)
Glucose-Capillary: 193 mg/dL — ABNORMAL HIGH (ref 70–99)

## 2015-01-05 LAB — MAGNESIUM: MAGNESIUM: 2.7 mg/dL — AB (ref 1.5–2.5)

## 2015-01-05 LAB — PHOSPHORUS: Phosphorus: 7.3 mg/dL — ABNORMAL HIGH (ref 2.3–4.6)

## 2015-01-05 MED ORDER — INSULIN REGULAR HUMAN 100 UNIT/ML IJ SOLN
INTRAMUSCULAR | Status: AC
  Administered 2015-01-05: 18:00:00 via INTRAVENOUS
  Filled 2015-01-05: qty 1440

## 2015-01-05 MED ORDER — LACTULOSE 10 GM/15ML PO SOLN
30.0000 g | Freq: Two times a day (BID) | ORAL | Status: DC
  Administered 2015-01-05 – 2015-01-06 (×3): 30 g
  Filled 2015-01-05 (×10): qty 45

## 2015-01-05 MED ORDER — TRACE MINERALS CR-CU-F-FE-I-MN-MO-SE-ZN IV SOLN
INTRAVENOUS | Status: DC
  Filled 2015-01-05: qty 1440

## 2015-01-05 MED ORDER — DEXMEDETOMIDINE HCL IN NACL 400 MCG/100ML IV SOLN
0.2000 ug/kg/h | INTRAVENOUS | Status: DC
  Administered 2015-01-05: 0.5 ug/kg/h via INTRAVENOUS
  Administered 2015-01-05 – 2015-01-06 (×8): 0.7 ug/kg/h via INTRAVENOUS
  Filled 2015-01-05: qty 50
  Filled 2015-01-05: qty 100
  Filled 2015-01-05 (×6): qty 50

## 2015-01-05 MED ORDER — FENTANYL CITRATE 0.05 MG/ML IJ SOLN
25.0000 ug | INTRAMUSCULAR | Status: DC | PRN
  Administered 2015-01-06: 25 ug via INTRAVENOUS
  Filled 2015-01-05: qty 2

## 2015-01-05 MED ORDER — RIFAXIMIN 550 MG PO TABS
550.0000 mg | ORAL_TABLET | Freq: Two times a day (BID) | ORAL | Status: DC
  Administered 2015-01-05 – 2015-01-06 (×3): 550 mg
  Filled 2015-01-05 (×8): qty 1

## 2015-01-05 MED ORDER — PIVOT 1.5 CAL PO LIQD
1000.0000 mL | ORAL | Status: DC
  Filled 2015-01-05 (×2): qty 1000

## 2015-01-05 NOTE — Progress Notes (Signed)
PULMONARY / CRITICAL CARE MEDICINE   Name: Kyle West MRN: [id number] DOB: 1951-06-05    ADMISSION DATE:  12/18/2014   REFERRING MD :  OSH  CHIEF COMPLAINT:  abd pain   INITIAL PRESENTATION:  64yo male with hx HTN, CAD s/p CABG, cirrhosis of unclear etiology (followed at [facility]), reported recent hx pancreatitis.  Presented to OSH 2/11 with 2 day hx worsening abd pain, nausea and vomiting.  CT at outside hospital revealed pneumatosis, hepatic and portal venous gas and infrarenal AAA.  He was tx to [facility] and taken to OR 2/11 for exp lap r/t ischemic bowel/SBO.  Course c/b Afib with RVR and abd wound dehiscence on 2/18 with return to OR.  On 2/25 developed AMS, worsening hypotension and acidosis and PCCM re-consulted.   MAJOR EVENTS: 2/11 exploratory laparotomy, small bowel resection, application of wound vac (Dr Luisa Hart). Left intubated post op. Post op hypotension requiring CVL placement, volume resuscitation, vasopressors 2/12 sinus tachycardia, new onset AFRVR. Vasopressors changed from NE to PE. Low dose metoprolol initiated 2/13 back to OR, side-to-side small bowel anastomosis & closure of fascia.  Persistent AFib with RVR. Levo added. 2/15 extubated, off pressors 2/16 d/c amiodarone 2/18 Abd wound dehiscence, Return to OR  2/25 AMS, acidotic, hypotensive, tx ICU and PCCM re consulted.   2/27 off 3% NS 2/29 Dexmedetomidine initiated 3/02 Persistent obtundation. Precedex DC'd. Suspected hepatic encephalopathy. Begin lactulose and rifaximin. Pt's wife raised issue of advanced directives and reported pt's previous wishes for DNR in event of arrest. Plan to continue aggressive support but without escalation (including no HD of any form) and no ACLS/CPR  TESTS: 2/11 CT abd/pelvis >> pneumatosis SB with moderate ascites, infrarenal AAA, cirrhosis 2/24 CTAP: No findings specific for bowel ischemia on this noncontrast examination. No pneumatosis. Scattered areas of small bowel and colonic  wall thickening,No evidence of intra abdominal abscess. Small volume pneumoperitoneum with scattered foci of free air beneath the anterior abdominal wall, likely related to recent laparotomy. 4.2 cm infrarenal abdominal aortic aneurysm. Cirrhosis 2/27 Echo >> EF 55 to 60%, grade 1 diastolic dysfx  SUBJECTIVE:  RASS -3. Not F/C. Tolerates PS 10 cm H2O  VITAL SIGNS: Temp:  [98.6 F (37 C)-101.2 F (38.4 C)] 98.6 F (37 C) (03/02 0815) Pulse Rate:  [92-120] 105 (03/02 0815) Resp:  [15-26] 15 (03/02 0815) BP: (109-179)/(44-65) 172/65 mmHg (03/02 0815) SpO2:  [97 %-100 %] 98 % (03/02 0815) Arterial Line BP: (85-180)/(34-65) 180/63 mmHg (03/02 0800) FiO2 (%):  [30 %] 30 % (03/02 0815) Weight:  [113.4 kg (250 lb)] 113.4 kg (250 lb) (03/02 0334) INTAKE / OUTPUT:  Intake/Output Summary (Last 24 hours) at 01/05/15 0950 Last data filed at 01/05/15 0800  Gross per 24 hour  Intake 3434.32 ml  Output   3385 ml  Net  49.32 ml    PHYSICAL EXAMINATION: General: sedated, intubated Neuro:  RASS -3, MAEs HEENT: WNL Cardiovascular: reg, no M Lungs: clear anteriorly  Abdomen:  Soft, wound site clean Ext: 2+ BUE edema, no LE edema  LABS: I have reviewed all of today's lab results. Relevant abnormalities are discussed in the A/P section  CXR: improving edema, persistent bibasilar atx   ASSESSMENT / PLAN:  PULMONARY ETT 2/11 >> 2/15 ETT 2/25 >>  A: Acute respiratory failure, recurrent Hx of OSA P:   Cont vent support - settings reviewed and/or adjusted Wean in PSV mode as tolerated Cont vent bundle Daily SBT if/when meets criteria  CARDIOVASCULAR Rt femoral CVL 2/25 >> 2/29  RUE PICC 2/29 >>  Rt radial A-line 2/27 >> A: Shock - multifactorial PAF, RVR > NSR AAA - nondissecting. Hx of CAD s/p CABG, HTN, HLD. P:  Cont NE as needed to maintain MAP > 65 mmHg  RENAL A:   AKI - not a candidate for HD (see below) Lactic acidosis, resolved Hyponatremia, resolved P:   Monitor  BMET intermittently Monitor I/Os Correct electrolytes as indicated  GASTROINTESTINAL A:   Ischemic/infarcted bowel Laparotomy with bowel resection 2/11 Laparotomy for fascial closure 2/13  Wound dehiscence 2/18 Cirrhosis - EtOH/NASH Ascites P:   SUP: Pantoprazole Cont TPN Cont trickle feeds per CCS Post op and wound mgmt per CCS  HEMATOLOGIC A:   Anemia without overt bleeding Thrombocytopenia, resolved P:  DVT px: SQ heparin Monitor CBC intermittently Transfuse per usual ICU guidelines   INFECTIOUS A:   Septic shock 2nd to peritonitis New severe sepsis 2/25, of unclear etiology Elevated PCT of unclear etiology P:   All micro reviewed  vanc 2/25>>>2/27 Zosyn 2/20 >> 2/29  Resp cx 3/01 >>    ENDOCRINE A:   Stress induced hyperglycemia P:   Continue SSI while on TPN  NEUROLOGIC A:   Hx of depression Post-op pain Deconditioning Agitated delirium, resolved Stupor/obtundation - suspect hepatic encephalopathy P:   RASS goal: 0 DC dexmedetomidine  Decrease dose of PRN fentanyl PRN fentanyl Begin lactulose and rifaximin Daily WUA  Wife updated @ bedside. She expresses desire to make him DNR in event of cardiac arrest based on his previously expressed wishes  CC time 35 minutes.  Billy Fischeravid Gualberto Wahlen, MD ; [facility] Mobile [phone].  After 5:30 PM or weekends, call [phone]

## 2015-01-05 NOTE — Telephone Encounter (Signed)
-----   Message from Nada LibmanVance W Brabham, MD sent at 01/03/2015  6:03 PM EST ----- Regarding: RE: Referral That would be fine ----- Message -----    From: Fredrich Birksana P Millikan    Sent: 12/28/2014  10:36 AM      To: Nada LibmanVance W Brabham, MD Subject: Referral                                       Dr Myra GianottiBrabham,  Mr Kyle West is scheduled to see you in August to follow up on his AAA after his recent hospitalization. However he is from [place] [facility] His wife is asking that you "OK" a referral to a vascular [place] in [place].  Thanks, [place]rest[place]

## 2015-01-05 NOTE — Consult Note (Signed)
WOC wound consult note Reason for Consult: Changed Eakin pouch; previous one applied on Mon. CCS following for assessment and plan of care to abd wound. Wound type: Full thickness post-op abd wound to midline; retention sutures must remain in place for a few more weeks, according to CCS. Wound bed: Beefy red Drainage (amount, consistency, odor) Large amt yellow drainage in bedside drainage bag. Periwound: Retention sutures surrounding wound, skin macerated around wound edges to 5 cm surrounding wound. Difficult to maintain pouch seal R/T sutures. Dressing procedure/placement/frequency: Applied large Eakin pouch to contain drainage. Supplies and directions left at bedside for staff nurses if leakage occurs. Cammie Mcgeeawn Falon Huesca MSN, RN, CWOCN, [place]CN-AP, CNS [phone]

## 2015-01-05 NOTE — Progress Notes (Signed)
Patient ID: Kyle West, male   DOB: [birth year], 64 y.o.   MRN: [id number] S:still unresponsive O:BP 109/44 mmHg  Pulse 92  Temp(Src) 99 F (37.2 C) (Oral)  Resp 18  Ht 6\' 1"  (1.854 m)  Wt 113.4 kg (250 lb)  BMI 32.99 kg/m2  SpO2 98%  Intake/Output Summary (Last 24 hours) at 01/05/15 0813 Last data filed at 01/05/15 0600  Gross per 24 hour  Intake 3094.58 ml  Output   3325 ml  Net -230.42 ml   Intake/Output: I/O last 3 completed shifts: In: 5163.2 [I.V.:943.2; NG/GT:720] Out: 6000 [Urine:1750; Drains:750; Stool:3500]  Intake/Output this shift:    Weight change: -1.1 kg (-2 lb 6.8 oz) WUJ:WJXBJYNWGNFGen:chronically ill-appearing WM intubated and unresponsibe CVS:no rub Resp:scattered rhonchi AOZ:HYQMAbd:open wound with eakin's pouch in place,  Ext:+edema/anasarca   Recent Labs Lab 12/30/14 0430  12/30/14 1915 12/31/14 0520  01/01/15 0330  01/01/15 2100 01/02/15 0001 01/02/15 0345 01/03/15 0330 01/04/15 0340 01/04/15 1359 01/05/15 0420  NA 122*  < > 120* 117*  < > 118*  < > 124* 126* 127* 135 143 145 144  K 3.7  < > 3.5 3.1*  < > 3.4*  < > 3.7 3.8 3.7 3.5 4.3 4.8 5.0  CL 99  < > 97 88*  < > 86*  < > 94* 97 97 104 111 115* 115*  CO2 14*  < > 11* 18*  < > 25  < > 25 25 24 27 22 24 22   GLUCOSE 179*  < > 147* 195*  < > 200*  < > 180* 187* 201* 204* 180* 185* 206*  BUN 93*  < > 95* 95*  < > 93*  < > 95* 94* 89* 90* 105* 112* 129*  CREATININE 2.28*  < > 2.28* 2.27*  < > 2.22*  < > 2.09* 2.03* 1.99* 1.82* 1.99* 2.12* 2.41*  ALBUMIN 1.4*  --  1.3* 1.3*  --  1.0*  --   --   --  1.0* <1.0*  --  <1.0* 1.0*  CALCIUM 7.1*  < > 6.6* 6.4*  < > 6.6*  < > 6.6* 6.6* 6.7* 7.3* 7.6* 7.6* 7.6*  PHOS 5.6*  --  5.8* 5.3*  --   --   --   --   --   --  6.2* 6.9* 6.9* 7.3*  AST 72*  --  90* 130*  --  136*  --   --   --  121* 117*  --   --  186*  ALT 34  --  39 53  --  57*  --   --   --  49 48  --   --  52  < > = values in this interval not displayed. Liver Function Tests:  Recent Labs Lab  01/02/15 0345 01/03/15 0330 01/04/15 1359 01/05/15 0420  AST 121* 117*  --  186*  ALT 49 48  --  52  ALKPHOS 114 154*  --  201*  BILITOT 2.5* 3.0*  --  3.6*  PROT 3.3* 3.6*  --  3.8*  ALBUMIN 1.0* <1.0* <1.0* 1.0*   No results for input(s): LIPASE, AMYLASE in the last 168 hours.  Recent Labs Lab 12/30/14 0800  AMMONIA 43*   CBC:  Recent Labs Lab 12/30/14 0744  01/01/15 0330 01/02/15 0345 01/03/15 0330 01/04/15 0340 01/05/15 0420  WBC 31.5*  < > 26.6* 23.1* 17.9* 18.0* 17.5*  NEUTROABS 26.1*  --   --   --  13.0*  --   --  HGB 8.8*  < > 7.8* 7.7* 7.5* 7.4* 7.7*  HCT 24.7*  < > 21.5* 21.6* 21.1* 21.0* 22.0*  MCV 96.1  < > 93.5 92.3 93.8 94.6 96.5  PLT 154  < > 195 185 160 151 145*  < > = values in this interval not displayed. Cardiac Enzymes:  Recent Labs Lab 12/29/14 1000 12/30/14 0917 12/30/14 1400 12/30/14 1915  TROPONINI <0.03 <0.03 0.06* 0.05*   CBG:  Recent Labs Lab 01/04/15 1116 01/04/15 1505 01/04/15 1952 01/04/15 2358 01/05/15 0421  GLUCAP 172* 187* 174* 154* 179*    Iron Studies: No results for input(s): IRON, TIBC, TRANSFERRIN, FERRITIN in the last 72 hours. Studies/Results: Dg Chest Port 1 View  01/05/2015   CLINICAL DATA:  Respiratory failure  EXAM: PORTABLE CHEST - 1 VIEW  COMPARISON:  Portable chest x-ray of 01/04/2015 and 01/03/2015  FINDINGS: The tip of the endotracheal tube is approximately 3.0 cm above the carina. A right PICC line remains with the tip overlying the mid SVC. Bibasilar opacities persist some a which is due to atelectasis but pneumonia particularly in the left lung base cannot be excluded. Cardiomegaly is stable.  IMPRESSION: 1. Tip of endotracheal tube 3.0 cm above the carina. 2. PICC line tip overlies the mid SVC. 3. Persistent bibasilar opacities consistent with atelectasis. Pneumonia at the left lung base cannot be excluded   Electronically Signed   By: Dwyane Dee M.D.   On: 01/05/2015 07:16   Dg Chest Port 1  View  01/04/2015   CLINICAL DATA:  Respiratory failure.  Shortness of breath.  EXAM: PORTABLE CHEST - 1 VIEW  COMPARISON:  01/03/2015.  FINDINGS: Interim placement of right PICC line, its tip is in the superior vena cava. Endotracheal tube and feeding tube in good anatomic position. Prior CABG. Stable cardiomegaly with normal pulmonary vascularity. Interim continued clearing of pulmonary edema. Persistent bibasilar atelectasis and/or edema remain. Small left pleural effusion cannot be excluded. No pneumothorax.  IMPRESSION: 1. Interim placement of right PICC line, its tip is in the superior vena cava. 2. Endotracheal tube and feeding tube in stable position. 3. Interim continued clearing of pulmonary edema with bibasilar infiltrates and atelectasis remaining. 4. Prior CABG. Stable cardiomegaly. Pulmonary vascularity is normal.   Electronically Signed   By: Maisie Fus  Register   On: 01/04/2015 07:24   . antiseptic oral rinse  7 mL Mouth Rinse QID  . chlorhexidine  15 mL Mouth Rinse BID  . feeding supplement (PIVOT 1.5 CAL)  1,000 mL Per Tube Q24H  . heparin subcutaneous  5,000 Units Subcutaneous 3 times per day  . insulin aspart  0-15 Units Subcutaneous 6 times per day  . ipratropium-albuterol  3 mL Nebulization Q6H  . pantoprazole sodium  40 mg Per Tube Q24H  . sodium chloride  10-40 mL Intracatheter Q12H    BMET    Component Value Date/Time   NA 144 01/05/2015 0420   K 5.0 01/05/2015 0420   CL 115* 01/05/2015 0420   CO2 22 01/05/2015 0420   GLUCOSE 206* 01/05/2015 0420   BUN 129* 01/05/2015 0420   CREATININE 2.41* 01/05/2015 0420   CALCIUM 7.6* 01/05/2015 0420   GFRNONAA 27* 01/05/2015 0420   GFRAA 31* 01/05/2015 0420   CBC    Component Value Date/Time   WBC 17.5* 01/05/2015 0420   RBC 2.28* 01/05/2015 0420   HGB 7.7* 01/05/2015 0420   HCT 22.0* 01/05/2015 0420   PLT 145* 01/05/2015 0420   MCV 96.5 01/05/2015 0420  MCH 33.8 01/05/2015 0420   MCHC 35.0 01/05/2015 0420   RDW 17.3*  01/05/2015 0420   LYMPHSABS 1.0 01/03/2015 0330   MONOABS 3.1* 01/03/2015 0330   EOSABS 0.6 01/03/2015 0330   BASOSABS 0.1 01/03/2015 0330     Assessment/Plan:  1. AKI/CKD- had been improving toward his baseline (1.3-1.5), decreased pressor requirements but now with rising Scr.  Worrisome for hepatorenal syndrome.  Will check FeNa, continue with current management.  He is not a candidate for dialysis and would not offer as this would do nothing to reverse the underlying problem which is cirrhosis and profound protein malnutrition and anasarca. 2. Hyponatremia- improving with added solutes to TNA.  1. Discontinued sodium citrate po yesterday but had hypotension and increased pressors  2. Stop IV NS and cont with added sodium to TNA and follow serum Na levels.  3. 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 Will need to discuss with surgery if it is safe for him to have lactulose enema vs. Via NGT 4. VDRF-per PCCM 5. SIRS- weaning pressors as tolerated 6. S/p bowel resection- still with sig drainage from open wound, has eakin's pouch per CCS 7. Severe protein malnutrition- on tna 8. ABLA- follow and transfuse prn 9. Dispo- poor prognosis with multi-organ failure and severe malnutrition recommend palliative care/hospice consult Lilliane Sposito A

## 2015-01-05 NOTE — Progress Notes (Signed)
Patient ID: Kyle West, male   DOB: December 01, 1950, 64 y.o.   MRN: [id number]     [address] [place], [place] [address]    Phone: [phone] FAX: [phone]     Subjective: 142m out of eakin's.  Febrile.  WBC down.    Objective:  Vital signs:  Filed Vitals:   01/05/15 0430 01/05/15 0500 01/05/15 0600 01/05/15 0700  BP:      Pulse: 96 95 94 92  Temp:      TempSrc:      Resp: 19 18 20 18   Height:      Weight:      SpO2: 97% 98% 98% 98%    Last BM Date: 12/30/14  Intake/Output   Yesterday:  03/01 0701 - 03/02 0700 In: 3418.4 [I.V.:638.4; NG/GT:480; TPN:2300] Out: 38891 [QXIHW:3888 Drains:750; SKCMKL:4917]This shift: I/O last 3 completed shifts: In: 5163.2 [I.V.:943.2; NG/GT:720] Out: 6000 [Urine:1750; Drains:750; Stool:3500]   Physical Exam: General: on vent  Abdomen: Soft. Nondistended. Non tender. Midline wound with retention sutures in place. Eakin's pouch with serous, output. No evidence of peritonitis. No incarcerated hernias.  Problem List:   Active Problems:   Abdominal pain   Ischemic bowel disease   Acute respiratory failure   Sepsis   AKI (acute kidney injury)   Peritonitis   Acute respiratory failure, unspecified whether with hypoxia or hypercapnia   SOB (shortness of breath)    Results:   Labs: Results for orders placed or performed during the hospital encounter of 12/22/2014 (from the past 48 hour(s))  Glucose, capillary     Status: Abnormal   Collection Time: 01/03/15  7:53 AM  Result Value Ref Range   Glucose-Capillary 180 (H) 70 - 99 mg/dL   Comment 1 Arterial Specimen   Glucose, capillary     Status: Abnormal   Collection Time: 01/03/15 11:50 AM  Result Value Ref Range   Glucose-Capillary 177 (H) 70 - 99 mg/dL   Comment 1 Arterial Specimen   Glucose, capillary     Status: Abnormal   Collection Time: 01/03/15  4:39 PM  Result Value Ref Range   Glucose-Capillary 179 (H) 70 - 99 mg/dL   Comment 1 Arterial Specimen   Glucose, capillary     Status: Abnormal   Collection Time: 01/03/15  7:11 PM  Result Value Ref Range   Glucose-Capillary 188 (H) 70 - 99 mg/dL   Comment 1 Arterial Specimen   Glucose, capillary     Status: Abnormal   Collection Time: 01/03/15 11:06 PM  Result Value Ref Range   Glucose-Capillary 156 (H) 70 - 99 mg/dL   Comment 1 Arterial Specimen   Glucose, capillary     Status: Abnormal   Collection Time: 01/04/15  3:39 AM  Result Value Ref Range   Glucose-Capillary 171 (H) 70 - 99 mg/dL  Basic metabolic panel     Status: Abnormal   Collection Time: 01/04/15  3:40 AM  Result Value Ref Range   Sodium 143 135 - 145 mmol/L    Comment: DELTA CHECK NOTED   Potassium 4.3 3.5 - 5.1 mmol/L    Comment: DELTA CHECK NOTED NO VISIBLE HEMOLYSIS    Chloride 111 96 - 112 mmol/L   CO2 22 19 - 32 mmol/L   Glucose, Bld 180 (H) 70 - 99 mg/dL   BUN 105 (H) 6 - 23 mg/dL   Creatinine, Ser 1.99 (H) 0.50 - 1.35 mg/dL  Calcium 7.6 (L) 8.4 - 10.5 mg/dL   GFR calc non Af Amer 34 (L) >90 mL/min   GFR calc Af Amer 39 (L) >90 mL/min    Comment: (NOTE) The eGFR has been calculated using the CKD EPI equation. This calculation has not been validated in all clinical situations. eGFR's persistently <90 mL/min signify possible Chronic Kidney Disease.    Anion gap 10 5 - 15  CBC     Status: Abnormal   Collection Time: 01/04/15  3:40 AM  Result Value Ref Range   WBC 18.0 (H) 4.0 - 10.5 K/uL   RBC 2.22 (L) 4.22 - 5.81 MIL/uL   Hemoglobin 7.4 (L) 13.0 - 17.0 g/dL   HCT 21.0 (L) 39.0 - 52.0 %   MCV 94.6 78.0 - 100.0 fL   MCH 33.3 26.0 - 34.0 pg   MCHC 35.2 30.0 - 36.0 g/dL   RDW 16.8 (H) 11.5 - 15.5 %   Platelets 151 150 - 400 K/uL  Procalcitonin     Status: None   Collection Time: 01/04/15  3:40 AM  Result Value Ref Range   Procalcitonin 5.18 ng/mL    Comment:        Interpretation: PCT > 2 ng/mL: Systemic infection (sepsis)  is likely, unless other causes are known. (NOTE)         ICU PCT Algorithm               Non ICU PCT Algorithm    ----------------------------     ------------------------------         PCT < 0.25 ng/mL                 PCT < 0.1 ng/mL     Stopping of antibiotics            Stopping of antibiotics       strongly encouraged.               strongly encouraged.    ----------------------------     ------------------------------       PCT level decrease by               PCT < 0.25 ng/mL       >= 80% from peak PCT       OR PCT 0.25 - 0.5 ng/mL          Stopping of antibiotics                                             encouraged.     Stopping of antibiotics           encouraged.    ----------------------------     ------------------------------       PCT level decrease by              PCT >= 0.25 ng/mL       < 80% from peak PCT        AND PCT >= 0.5 ng/mL            Continuing antibiotics                                               encouraged.       Continuing antibiotics  encouraged.    ----------------------------     ------------------------------     PCT level increase compared          PCT > 0.5 ng/mL         with peak PCT AND          PCT >= 0.5 ng/mL             Escalation of antibiotics                                          strongly encouraged.      Escalation of antibiotics        strongly encouraged.   Phosphorus     Status: Abnormal   Collection Time: 01/04/15  3:40 AM  Result Value Ref Range   Phosphorus 6.9 (H) 2.3 - 4.6 mg/dL  Magnesium     Status: None   Collection Time: 01/04/15  3:40 AM  Result Value Ref Range   Magnesium 2.3 1.5 - 2.5 mg/dL  Glucose, capillary     Status: Abnormal   Collection Time: 01/04/15  7:56 AM  Result Value Ref Range   Glucose-Capillary 172 (H) 70 - 99 mg/dL   Comment 1 Venous Specimen   Glucose, capillary     Status: Abnormal   Collection Time: 01/04/15 11:16 AM  Result Value Ref Range   Glucose-Capillary 172 (H) 70 -  99 mg/dL   Comment 1 Arterial Specimen   Renal function panel     Status: Abnormal   Collection Time: 01/04/15  1:59 PM  Result Value Ref Range   Sodium 145 135 - 145 mmol/L   Potassium 4.8 3.5 - 5.1 mmol/L   Chloride 115 (H) 96 - 112 mmol/L   CO2 24 19 - 32 mmol/L   Glucose, Bld 185 (H) 70 - 99 mg/dL   BUN 112 (H) 6 - 23 mg/dL   Creatinine, Ser 2.12 (H) 0.50 - 1.35 mg/dL   Calcium 7.6 (L) 8.4 - 10.5 mg/dL   Phosphorus 6.9 (H) 2.3 - 4.6 mg/dL   Albumin <1.0 (L) 3.5 - 5.2 g/dL    Comment: REPEATED TO VERIFY   GFR calc non Af Amer 31 (L) >90 mL/min   GFR calc Af Amer 36 (L) >90 mL/min    Comment: (NOTE) The eGFR has been calculated using the CKD EPI equation. This calculation has not been validated in all clinical situations. eGFR's persistently <90 mL/min signify possible Chronic Kidney Disease.    Anion gap 6 5 - 15  Glucose, capillary     Status: Abnormal   Collection Time: 01/04/15  3:05 PM  Result Value Ref Range   Glucose-Capillary 187 (H) 70 - 99 mg/dL   Comment 1 Arterial Specimen   Glucose, capillary     Status: Abnormal   Collection Time: 01/04/15  7:52 PM  Result Value Ref Range   Glucose-Capillary 174 (H) 70 - 99 mg/dL  Glucose, capillary     Status: Abnormal   Collection Time: 01/04/15 11:58 PM  Result Value Ref Range   Glucose-Capillary 154 (H) 70 - 99 mg/dL  CBC     Status: Abnormal   Collection Time: 01/05/15  4:20 AM  Result Value Ref Range   WBC 17.5 (H) 4.0 - 10.5 K/uL   RBC 2.28 (L) 4.22 - 5.81 MIL/uL   Hemoglobin 7.7 (L) 13.0 - 17.0 g/dL   HCT  22.0 (L) 39.0 - 52.0 %   MCV 96.5 78.0 - 100.0 fL   MCH 33.8 26.0 - 34.0 pg   MCHC 35.0 30.0 - 36.0 g/dL   RDW 17.3 (H) 11.5 - 15.5 %   Platelets 145 (L) 150 - 400 K/uL  Comprehensive metabolic panel     Status: Abnormal   Collection Time: 01/05/15  4:20 AM  Result Value Ref Range   Sodium 144 135 - 145 mmol/L   Potassium 5.0 3.5 - 5.1 mmol/L   Chloride 115 (H) 96 - 112 mmol/L   CO2 22 19 - 32 mmol/L    Glucose, Bld 206 (H) 70 - 99 mg/dL   BUN 129 (H) 6 - 23 mg/dL   Creatinine, Ser 2.41 (H) 0.50 - 1.35 mg/dL   Calcium 7.6 (L) 8.4 - 10.5 mg/dL   Total Protein 3.8 (L) 6.0 - 8.3 g/dL   Albumin 1.0 (L) 3.5 - 5.2 g/dL   AST 186 (H) 0 - 37 U/L   ALT 52 0 - 53 U/L   Alkaline Phosphatase 201 (H) 39 - 117 U/L   Total Bilirubin 3.6 (H) 0.3 - 1.2 mg/dL   GFR calc non Af Amer 27 (L) >90 mL/min   GFR calc Af Amer 31 (L) >90 mL/min    Comment: (NOTE) The eGFR has been calculated using the CKD EPI equation. This calculation has not been validated in all clinical situations. eGFR's persistently <90 mL/min signify possible Chronic Kidney Disease.    Anion gap 7 5 - 15  Magnesium     Status: Abnormal   Collection Time: 01/05/15  4:20 AM  Result Value Ref Range   Magnesium 2.7 (H) 1.5 - 2.5 mg/dL  Phosphorus     Status: Abnormal   Collection Time: 01/05/15  4:20 AM  Result Value Ref Range   Phosphorus 7.3 (H) 2.3 - 4.6 mg/dL  Glucose, capillary     Status: Abnormal   Collection Time: 01/05/15  4:21 AM  Result Value Ref Range   Glucose-Capillary 179 (H) 70 - 99 mg/dL    Imaging / Studies: Dg Chest Port 1 View  01/05/2015   CLINICAL DATA:  Respiratory failure  EXAM: PORTABLE CHEST - 1 VIEW  COMPARISON:  Portable chest x-ray of 01/04/2015 and 01/03/2015  FINDINGS: The tip of the endotracheal tube is approximately 3.0 cm above the carina. A right PICC line remains with the tip overlying the mid SVC. Bibasilar opacities persist some a which is due to atelectasis but pneumonia particularly in the left lung base cannot be excluded. Cardiomegaly is stable.  IMPRESSION: 1. Tip of endotracheal tube 3.0 cm above the carina. 2. PICC line tip overlies the mid SVC. 3. Persistent bibasilar opacities consistent with atelectasis. Pneumonia at the left lung base cannot be excluded   Electronically Signed   By: Ivar Drape M.D.   On: 01/05/2015 07:16   Dg Chest Port 1 View  01/04/2015   CLINICAL DATA:  Respiratory  failure.  Shortness of breath.  EXAM: PORTABLE CHEST - 1 VIEW  COMPARISON:  01/03/2015.  FINDINGS: Interim placement of right PICC line, its tip is in the superior vena cava. Endotracheal tube and feeding tube in good anatomic position. Prior CABG. Stable cardiomegaly with normal pulmonary vascularity. Interim continued clearing of pulmonary edema. Persistent bibasilar atelectasis and/or edema remain. Small left pleural effusion cannot be excluded. No pneumothorax.  IMPRESSION: 1. Interim placement of right PICC line, its tip is in the superior vena cava. 2. Endotracheal tube  and feeding tube in stable position. 3. Interim continued clearing of pulmonary edema with bibasilar infiltrates and atelectasis remaining. 4. Prior CABG. Stable cardiomegaly. Pulmonary vascularity is normal.   Electronically Signed   By: Marcello Moores  Register   On: 01/04/2015 07:24    Medications / Allergies:  Scheduled Meds: . antiseptic oral rinse  7 mL Mouth Rinse QID  . chlorhexidine  15 mL Mouth Rinse BID  . feeding supplement (PIVOT 1.5 CAL)  1,000 mL Per Tube Q24H  . heparin subcutaneous  5,000 Units Subcutaneous 3 times per day  . insulin aspart  0-15 Units Subcutaneous 6 times per day  . ipratropium-albuterol  3 mL Nebulization Q6H  . pantoprazole sodium  40 mg Per Tube Q24H  . sodium chloride  10-40 mL Intracatheter Q12H   Continuous Infusions: . Marland KitchenTPN (CLINIMIX-E) Adult 100 mL/hr at 01/04/15 1659  . sodium chloride 10 mL/hr at 01/01/15 2012  . dexmedetomidine 0.3 mcg/kg/hr (01/05/15 0334)  . norepinephrine (LEVOPHED) Adult infusion 15 mcg/min (01/05/15 0600)   PRN Meds:.Place/Maintain arterial line **AND** sodium chloride, fentaNYL, levalbuterol, ondansetron (ZOFRAN) IV, sodium chloride, sodium chloride, white petrolatum  Antibiotics: Anti-infectives    Start     Dose/Rate Route Frequency Ordered Stop   12/30/14 1000  vancomycin (VANCOCIN) 1,250 mg in sodium chloride 0.9 % 250 mL IVPB  Status:  Discontinued      1,250 mg 166.7 mL/hr over 90 Minutes Intravenous Every 24 hours 12/30/14 0939 01/01/15 0645   12/26/14 0930  vancomycin (VANCOCIN) 1,250 mg in sodium chloride 0.9 % 250 mL IVPB  Status:  Discontinued     1,250 mg 166.7 mL/hr over 90 Minutes Intravenous Every 24 hours 12/25/14 0912 12/28/14 0850   12/25/14 1000  piperacillin-tazobactam (ZOSYN) IVPB 3.375 g     3.375 g 12.5 mL/hr over 240 Minutes Intravenous Every 8 hours 12/25/14 0921 01/03/15 2359   12/25/14 0930  vancomycin (VANCOCIN) 1,750 mg in sodium chloride 0.9 % 500 mL IVPB     1,750 mg 250 mL/hr over 120 Minutes Intravenous  Once 12/25/14 0912 12/25/14 1208   12/25/14 0900  piperacillin-tazobactam (ZOSYN) IVPB 3.375 g  Status:  Discontinued     3.375 g 100 mL/hr over 30 Minutes Intravenous 3 times per day 12/25/14 0852 12/25/14 0921   12/14/2014 2200  [MAR Hold]  rifaximin (XIFAXAN) tablet 550 mg  Status:  Discontinued     (MAR Hold since 12/07/2014 1721)   550 mg Oral 2 times daily 12/10/2014 1634 12/06/2014 1726   12/22/2014 1430  piperacillin-tazobactam (ZOSYN) IVPB 3.375 g     3.375 g 12.5 mL/hr over 240 Minutes Intravenous 3 times per day 12/09/2014 1403 12/24/14 0347       Assessment/Plan Ischemic bowel  Septic shock POD 20/19/13, s/p ex lap with open abdomen and SBR, s/p relook and closure, s/p ex lap and closure with retentions for dehiscence -Eakin's pouch -I&Os to keep up with fluid loss -may increase tube feeds, decrease TPN -leave retention sutures in place  MMP per CCM-VDRF, Hepatorenal failure   Erby Pian, ANP-BC [facility] Pager [phone](7A-4:30P) For consults and floor pages call [phone](7A-4:30P)  01/05/2015 7:30 AM

## 2015-01-05 NOTE — Progress Notes (Addendum)
NUTRITION FOLLOW UP  Intervention:    -Recommend increase Pivot 1.5 to goal of 50 ml/hr via post-pyloric tube.  -Recommend 60 ml Prostat BID  -Tube feeding regimen provides 2200 kcal (96% of needs), 172 grams of protein, and 910 ml of H2O.    Nutrition Dx:   Inadequate oral intake related to inability to eat as evidenced by NPO status; ongoing  Goal:   Intake to meet >90% of estimated nutrition needs; met.  Monitor:   TF tolerance, vent status, labs, weight trend.  Assessment:   64yo male with hx HTN, CAD s/p CABG, cirrhosis, reported recent hx pancreatitis. Presented with 2 day hx worsening abd pain, nausea and vomiting.  SP ex lap with small bowel resection due to ischemic bowel and application of wound VAC on 2/11.  Pt tx to 2H due to trouble breathing, pt intubated 2/25 now in septic shock.Pt started on TPN 2/12 due to ileus.   Patient is currently intubated on ventilator support MV: 11.6 L/min Temp (24hrs), Avg:99.6 F (37.6 C), Min:98.2 F (36.8 C), Max:101.2 F (38.4 C)  TPN regimen (Clinimix with LYTES 5/15) was decreased to 60 mL/hr, lipids d/c'ed. TPN regimen provides a total of 1022 kcal and 72 gm protein.   Post pyloric tube was placed and trickle feeds initiated on 01/02/15- good position confirmed by CXR. Pivot 1.5 increased to 40 ml/hr 3/2 (which provides 1440 kcals, 90 grams protein, and 728 ml fluid).   Total Nutrition Regimen provides:  2462 kcal and 162 grams protein  Pt remains with eakin's pouch to measure peritoneal output and keep up with fluid loss. Output remains less than 1.5 L (3/1 1400 ml). Pouch was changed this AM per Auxier RN.   Labs reviewed. Magnesium, Phosphorus, BUN, and Cr elevated CBG's: 164-206  Per MD note Pt now DNR, continue aggressive support but without escalation (no HD).   Height: Ht Readings from Last 1 Encounters:  12/28/14 6' 1"  (1.854 m)    Weight Status:   Wt Readings from Last 1 Encounters:  01/05/15 250 lb (113.4  kg)   12/31/14 244 lb 4.3 oz (110.8 kg)    12/21/14 252 lb 10.4 oz (114.6 kg)       12/17/14 235 lb 14.3 oz (107 kg)           Re-estimated needs:  Kcal: 2297 Protein: 170-185 grams Fluid: >2.2 L  Skin: SDTI on rt nose, stage II pressure ulcer on bilateral buttocks, closed abdominal incision  Diet Order: .TPN (CLINIMIX-E) Adult .TPN (CLINIMIX-E) Adult   Intake/Output Summary (Last 24 hours) at 01/05/15 1501 Last data filed at 01/05/15 0800  Gross per 24 hour  Intake 2560.5 ml  Output   2685 ml  Net -124.5 ml    Last BM: 2/26  Labs:   Recent Labs Lab 01/03/15 0330 01/04/15 0340 01/04/15 1359 01/05/15 0420  NA 135 143 145 144  K 3.5 4.3 4.8 5.0  CL 104 111 115* 115*  CO2 27 22 24 22   BUN 90* 105* 112* 129*  CREATININE 1.82* 1.99* 2.12* 2.41*  CALCIUM 7.3* 7.6* 7.6* 7.6*  MG 2.1 2.3  --  2.7*  PHOS 6.2* 6.9* 6.9* 7.3*  GLUCOSE 204* 180* 185* 206*    CBG (last 3)   Recent Labs  01/05/15 0421 01/05/15 0812 01/05/15 1210  GLUCAP 179* 170* 164*    Scheduled Meds: . antiseptic oral rinse  7 mL Mouth Rinse QID  . chlorhexidine  15 mL Mouth Rinse BID  . [  START ON 01/06/2015] feeding supplement (PIVOT 1.5 CAL)  1,000 mL Per Tube Q24H  . heparin subcutaneous  5,000 Units Subcutaneous 3 times per day  . insulin aspart  0-15 Units Subcutaneous 6 times per day  . ipratropium-albuterol  3 mL Nebulization Q6H  . lactulose  30 g Per Tube BID  . pantoprazole sodium  40 mg Per Tube Q24H  . rifaximin  550 mg Per Tube Q12H  . sodium chloride  10-40 mL Intracatheter Q12H    Continuous Infusions: . Marland KitchenTPN (CLINIMIX-E) Adult 100 mL/hr at 01/05/15 0800  . Marland KitchenTPN (CLINIMIX-E) Adult    . sodium chloride 10 mL/hr at 01/05/15 0800  . dexmedetomidine 0.7 mcg/kg/hr (01/05/15 1318)  . norepinephrine (LEVOPHED) Adult infusion 15.04 mcg/min (01/05/15 0800)    [place], [place], CN[facility] [id number] Pager [phone] After Hours Pager

## 2015-01-05 NOTE — Progress Notes (Signed)
Inpatient Rehabilitation  I have been following pt. at a distance.  He remains intubated at this time.  It is unlikely that he would be able to tolerate IP rehab even if he successfully weans from the vent.     I spoke with his wife Marylene Landngela and informed her of the [facility] through [facility] Department [facility].  Per Spiritual Care,  currently there is no one occupying the home and they would be glad to discuss with Mrs. Linwood DibblesHargis if she would like to consider staying locally and not have to drive 1 1/2 hours one way.  She says she will give this some thought and will inform the RN if she decides to move forward.  These accomodations are at no cost.    I will sign off.    Weldon PickingSusan Brittie Whisnant PT Inpatient Rehab Admissions Coordinator Cell [phone] Office [phone]

## 2015-01-05 NOTE — Progress Notes (Signed)
PARENTERAL NUTRITION CONSULT NOTE - FOLLOW UP  Pharmacy Consult for TPN Indication: Prolonged ileus  Allergies  Allergen Reactions  . Oxycodone Hives and Other (See Comments)    Hallucinations   Patient Measurements: Height: 6\' 1"  (185.4 cm) Weight: 250 lb (113.4 kg) IBW/kg (Calculated) : 79.9  Vital Signs: Temp: 99 F (37.2 C) (03/02 0334) Temp Source: Oral (03/02 0334) Pulse Rate: 92 (03/02 0700) Intake/Output from previous day: 03/01 0701 - 03/02 0700 In: 3418.4 [I.V.:638.4; NG/GT:480; TPN:2300] Out: 3425 [Urine:1275; Drains:750; Stool:1400]  Labs:  Recent Labs  01/03/15 0330 01/04/15 0340 01/05/15 0420  WBC 17.9* 18.0* 17.5*  HGB 7.5* 7.4* 7.7*  HCT 21.1* 21.0* 22.0*  PLT 160 151 145*    Recent Labs  01/03/15 0330 01/04/15 0340 01/04/15 1359 01/05/15 0420  NA 135 143 145 144  K 3.5 4.3 4.8 5.0  CL 104 111 115* 115*  CO2 27 22 24 22   GLUCOSE 204* 180* 185* 206*  BUN 90* 105* 112* 129*  CREATININE 1.82* 1.99* 2.12* 2.41*  CALCIUM 7.3* 7.6* 7.6* 7.6*  MG 2.1 2.3  --  2.7*  PHOS 6.2* 6.9* 6.9* 7.3*  PROT 3.6*  --   --  3.8*  ALBUMIN <1.0*  --  <1.0* 1.0*  AST 117*  --   --  186*  ALT 48  --   --  52  ALKPHOS 154*  --   --  201*  BILITOT 3.0*  --   --  3.6*  PREALBUMIN <3.0*  --   --   --   TRIG 50  --   --   --    Estimated Creatinine Clearance: 40.9 mL/min (by C-G formula based on Cr of 2.41).   Recent Labs  01/04/15 1952 01/04/15 2358 01/05/15 0421  GLUCAP 174* 154* 179*   Insulin Requirements in the past 24 hours:  12 units sensitive SSI, 25 units insulin in TPN  Admit:  [name] transferred from outside hospital for concern of ischemic bowel. He underwent surgery on 2/11 - exploratory laparotomy, small bowel resection and application of a wound vac. TPN was started in anticipation of prolonged bowel rest/ileus.   GI:  S/P ex. Lap with SBR on 2/11. Also history of pancreatitis and cirrhosis child pugh B. ZOX:[id number]:Lactulose, PPI,  Rifaximin, spironolactione, Ursodiol. S/P side-to-side small bowel anastomosis and closure of abdomen on 2/13.Continues to have ascites from wound- wound in tact with Eakin pouch to contain drainage. +BS. Baseline prealbumin low at 6.4 and now < 3. Patient decompensated on 2/27 and calorie count and diet were stopped. Tolerating trickle tube feeds. Increasing to 40 mL/hr per CCM   Endo: No history of diabetes. CBGs 154-187- has 25 units regular insulin in TPN bag along with SSI ordered.   Lytes: Changed TPN to include electrolytes and additional sodium. Na now stable at 144 mmol/L today; K 5, Mg 2.7, Phos 7.3. Sodium citrate discontinued   Renal: AKI s/p surgery which was resolving. SCr trended up to 2.41. UOP 0.5 mL/kg/hr. Net +137 mL yesterday  Pulm: Intubated, 30% FiO2   Cards: History of CAD. VSS.On norepinephrine to maintain MAP > 65, Did have episode of PAF after surgery which has since resolved.  Hepatobil: history of cirrhosis/NASH.alkp phos 154, AST (186),  ALT (52), Tbili at 3.6 INR 1.67 (2/13). TG 50.  Ammonia 27 (2/19; improved on lactulose- now stopped). Per RN, patient displaying s/s of jaundice (yellowing of eyes)  Neuro: Intubated on IV fentanyl and started on Precedex with hopes  to extubate soon. RASS -1, GCS 10   ID: WBC remains elevated (18) - Zosyn for intra-abdominal coverage (2/11>>), vanc dc'd. Afebrile, PCT 5.7, LA 1.3. CDiff(-) 2/16, re-checked 2/24 and was negative as well.  New CVC line (2/25), PIV line (2/25)  Best Practices: SCDs, mouthcare, Hep SQ, PPI   TPN Access: Triple Lumen IJ - changed to rt. Femoral triple lumen (2/25) TPN day#:19; (12/17/14 >> )  Nutritional Goals: (per RD note on 2/26) Kcal 2177 and Protein of 160-180 gm  Current Nutrition:  -Clinimix E 5/15 at 156ml/hr, providing 120g protein and 1700 kCal  - Pivot 1.5 increased to 40 mL/hr (current rate provides ~ 90 gm protein and 1440 kcal)  Plan:  -Decrease Clinimix with LYTES 5/15  to 60 mL/hr to provide a total of 72 gm protein and 1022 kcal. Pivot 1.5kcal and protein makes total of 162 gm protein, and 2462 kcal.(little higher than recommended). F/u plans to advance tube feeds and decrease TPN  -Omit Lipids since patient is on tube feeds  -Per nephrology, continue sodium in TPN bag as isotonic  -Daily MVI and M/W/F TE in TPN to prevent further increase in bilirubin. Consider holding TE if T Bili increases above 6  -Continue SSI and continue insulin at 25 units in TPN -MIVF as per MD orders  -F/U am labs  Vinnie Level, PharmD., BCPS Clinical Pharmacist Phone [phone]

## 2015-01-05 NOTE — Telephone Encounter (Signed)
I have tried to reach pt or pts wife at home # listed to let them know that we can [facility]. However, the number listed gives a busy signal. I have tried to call 3 different times and have be unsuccessful in reaching the [name] Will leave this info in EPIC in hopes that Mr Kyle West calls back to inquire about this information, dpm

## 2015-01-06 ENCOUNTER — Inpatient Hospital Stay (HOSPITAL_COMMUNITY): Payer: BLUE CROSS/BLUE SHIELD

## 2015-01-06 LAB — BASIC METABOLIC PANEL
Anion gap: 6 (ref 5–15)
BUN: 149 mg/dL — AB (ref 6–23)
CALCIUM: 7.8 mg/dL — AB (ref 8.4–10.5)
CO2: 22 mmol/L (ref 19–32)
Chloride: 125 mmol/L — ABNORMAL HIGH (ref 96–112)
Creatinine, Ser: 2.35 mg/dL — ABNORMAL HIGH (ref 0.50–1.35)
GFR calc non Af Amer: 28 mL/min — ABNORMAL LOW (ref >90)
GFR, EST AFRICAN AMERICAN: 32 mL/min — AB (ref >90)
GLUCOSE: 152 mg/dL — AB (ref 70–99)
Potassium: 4.6 mmol/L (ref 3.5–5.1)
Sodium: 153 mmol/L — ABNORMAL HIGH (ref 135–145)

## 2015-01-06 LAB — CULTURE, RESPIRATORY W GRAM STAIN

## 2015-01-06 LAB — COMPREHENSIVE METABOLIC PANEL
ALBUMIN: 1.1 g/dL — AB (ref 3.5–5.2)
ALK PHOS: 202 U/L — AB (ref 39–117)
ALT: 60 U/L — ABNORMAL HIGH (ref 0–53)
AST: 221 U/L — ABNORMAL HIGH (ref 0–37)
Anion gap: 8 (ref 5–15)
BUN: 144 mg/dL — ABNORMAL HIGH (ref 6–23)
CHLORIDE: 123 mmol/L — AB (ref 96–112)
CO2: 21 mmol/L (ref 19–32)
Calcium: 7.7 mg/dL — ABNORMAL LOW (ref 8.4–10.5)
Creatinine, Ser: 2.27 mg/dL — ABNORMAL HIGH (ref 0.50–1.35)
GFR calc Af Amer: 33 mL/min — ABNORMAL LOW (ref >90)
GFR calc non Af Amer: 29 mL/min — ABNORMAL LOW (ref >90)
Glucose, Bld: 201 mg/dL — ABNORMAL HIGH (ref 70–99)
POTASSIUM: 4.7 mmol/L (ref 3.5–5.1)
Sodium: 152 mmol/L — ABNORMAL HIGH (ref 135–145)
TOTAL PROTEIN: 4.1 g/dL — AB (ref 6.0–8.3)
Total Bilirubin: 4.9 mg/dL — ABNORMAL HIGH (ref 0.3–1.2)

## 2015-01-06 LAB — PROCALCITONIN: PROCALCITONIN: 7.46 ng/mL

## 2015-01-06 LAB — CULTURE, RESPIRATORY

## 2015-01-06 LAB — CBC
HEMATOCRIT: 23.4 % — AB (ref 39.0–52.0)
Hemoglobin: 8.1 g/dL — ABNORMAL LOW (ref 13.0–17.0)
MCH: 33.5 pg (ref 26.0–34.0)
MCHC: 34.6 g/dL (ref 30.0–36.0)
MCV: 96.7 fL (ref 78.0–100.0)
PLATELETS: 132 10*3/uL — AB (ref 150–400)
RBC: 2.42 MIL/uL — ABNORMAL LOW (ref 4.22–5.81)
RDW: 17.5 % — AB (ref 11.5–15.5)
WBC: 17.3 10*3/uL — AB (ref 4.0–10.5)

## 2015-01-06 LAB — GLUCOSE, CAPILLARY
GLUCOSE-CAPILLARY: 90 mg/dL (ref 70–99)
Glucose-Capillary: 202 mg/dL — ABNORMAL HIGH (ref 70–99)
Glucose-Capillary: 205 mg/dL — ABNORMAL HIGH (ref 70–99)
Glucose-Capillary: 128 mg/dL — ABNORMAL HIGH (ref 70–99)
Glucose-Capillary: 138 mg/dL — ABNORMAL HIGH (ref 70–99)
Glucose-Capillary: 262 mg/dL — ABNORMAL HIGH (ref 70–99)
Glucose-Capillary: 162 mg/dL — ABNORMAL HIGH (ref 70–99)

## 2015-01-06 LAB — PHOSPHORUS: Phosphorus: 7.5 mg/dL — ABNORMAL HIGH (ref 2.3–4.6)

## 2015-01-06 LAB — PROTIME-INR
INR: 1.9 — AB (ref 0.00–1.49)
PROTHROMBIN TIME: 21.9 s — AB (ref 11.6–15.2)

## 2015-01-06 LAB — MAGNESIUM: Magnesium: 2.9 mg/dL — ABNORMAL HIGH (ref 1.5–2.5)

## 2015-01-06 MED ORDER — DEXTROSE 5 % IV SOLN
INTRAVENOUS | Status: DC
  Administered 2015-01-06 – 2015-01-07 (×2): via INTRAVENOUS

## 2015-01-06 MED ORDER — HALOPERIDOL LACTATE 5 MG/ML IJ SOLN
1.0000 mg | INTRAMUSCULAR | Status: DC | PRN
  Administered 2015-01-06 (×2): 4 mg via INTRAVENOUS
  Filled 2015-01-06 (×2): qty 1

## 2015-01-06 MED ORDER — PIVOT 1.5 CAL PO LIQD
1000.0000 mL | ORAL | Status: DC
  Filled 2015-01-06 (×3): qty 1000

## 2015-01-06 MED ORDER — FREE WATER
250.0000 mL | Status: DC
  Administered 2015-01-06 (×2): 250 mL

## 2015-01-06 NOTE — Progress Notes (Signed)
   RN called eMD unable to advance panda  Plan Hold panda and orals Bedside > 7am MD to assess  Dr. Kalman ShanMurali Evon Lopezperez, M.D., [facility]P Pulmonary and Critical Care Medicine Staff Physician [facility] Pager: [phone], If no answer or between  15:00h - 7:00h: call [phone]  01/06/2015 11:40 PM

## 2015-01-06 NOTE — Progress Notes (Signed)
PULMONARY / CRITICAL CARE MEDICINE   Name: Kyle West MRN: [id number] DOB: 1951-08-30    ADMISSION DATE:  01/07/15   REFERRING MD :  OSH  CHIEF COMPLAINT:  abd pain   INITIAL PRESENTATION:  64yo male with hx HTN, CAD s/p CABG, cirrhosis of unclear etiology (followed at [facility]), reported recent hx pancreatitis.  Presented to OSH 2/11 with 2 day hx worsening abd pain, nausea and vomiting.  CT at outside hospital revealed pneumatosis, hepatic and portal venous gas and infrarenal AAA.  He was tx to [facility] and taken to OR 2/11 for exp lap r/t ischemic bowel/SBO.  Course c/b Afib with RVR and abd wound dehiscence on 2/18 with return to OR.  On 2/25 developed AMS, worsening hypotension and acidosis and PCCM re-consulted.   MAJOR EVENTS: 2/11 exploratory laparotomy, small bowel resection, application of wound vac (Dr Luisa Hart). Left intubated post op. Post op hypotension requiring CVL placement, volume resuscitation, vasopressors 2/12 sinus tachycardia, new onset AFRVR. Vasopressors changed from NE to PE. Low dose metoprolol initiated 2/13 back to OR, side-to-side small bowel anastomosis & closure of fascia.  Persistent AFib with RVR. Levo added. 2/15 extubated, off pressors 2/16 d/c amiodarone 2/18 Abd wound dehiscence, Return to OR  2/25 AMS, acidotic, hypotensive, tx ICU and PCCM re consulted.   2/27 off 3% NS 2/29 Dexmedetomidine initiated 3/02 Persistent obtundation. Precedex DC'd. Suspected hepatic encephalopathy. Begin lactulose and rifaximin. Pt's wife raised issue of advanced directives and reported pt's previous wishes for DNR in event of arrest. Plan to continue aggressive support but without escalation (including no HD of any form) and no ACLS/CPR 3/03 Extubated. Mild - mod agitated delirium > PRN Haldol  TESTS: 2/11 CT abd/pelvis >> pneumatosis SB with moderate ascites, infrarenal AAA, cirrhosis 2/24 CTAP: No findings specific for bowel ischemia on this noncontrast examination.  No pneumatosis. Scattered areas of small bowel and colonic wall thickening,No evidence of intra abdominal abscess. Small volume pneumoperitoneum with scattered foci of free air beneath the anterior abdominal wall, likely related to recent laparotomy. 4.2 cm infrarenal abdominal aortic aneurysm. Cirrhosis 2/27 Echo >> EF 55 to 60%, grade 1 diastolic dysfx  SUBJECTIVE:  RASS -1. Not F/C. Passed SBT, extubated. Intermittent agitation. PRN Haldol ordered  VITAL SIGNS: Temp:  [94.4 F (34.7 C)-98.6 F (37 C)] 94.4 F (34.7 C) (03/03 0745) Pulse Rate:  [79-112] 112 (03/03 1000) Resp:  [17-25] 18 (03/03 1000) BP: (161)/(58) 161/58 mmHg (03/03 0731) SpO2:  [92 %-100 %] 93 % (03/03 1000) Arterial Line BP: (75-173)/(38-71) 162/66 mmHg (03/03 1000) FiO2 (%):  [30 %-32 %] 32 % (03/03 0922) Weight:  [113.3 kg (249 lb 12.5 oz)] 113.3 kg (249 lb 12.5 oz) (03/03 0441) INTAKE / OUTPUT:  Intake/Output Summary (Last 24 hours) at 01/06/15 1411 Last data filed at 01/06/15 1026  Gross per 24 hour  Intake 2930.99 ml  Output   4460 ml  Net -1529.01 ml    PHYSICAL EXAMINATION: General: Not F/C, intermittent agitation Neuro:  RASS - to +1, MAEs HEENT: WNL Cardiovascular: reg, no M Lungs: clear anteriorly  Abdomen:  Soft, wound site clean Ext: 2+ BUE edema, no LE edema  LABS: I have reviewed all of today's lab results. Relevant abnormalities are discussed in the A/P section  CXR: NSC   ASSESSMENT / PLAN:  PULMONARY ETT 2/11 >> 2/15 ETT 2/25 >>  A: Acute respiratory failure, recurrent Hx of OSA P:   Supp O2 to maintain SpO2 > 93% Cont bronchodilators Need to clarify with  wife DNI status. It was discussed 3/03 but she seemed uncertain  CARDIOVASCULAR Rt femoral CVL 2/25 >> 2/29 RUE PICC 2/29 >>  Rt radial A-line 2/27 >> 3/03 A: Shock - multifactorial PAF, RVR > NSR AAA - nondissecting Hx of CAD s/p CABG, HTN, HLD P:  Cont NE as needed to maintain MAP > 60 mmHg DNR if  arrests  RENAL A:   AKI - not a candidate for HD (see below) Lactic acidosis, resolved Hyponatremia, resolved Hypernatremia P:   Monitor BMET intermittently Monitor I/Os Correct electrolytes as indicated Increase free water 3/03  GASTROINTESTINAL A:   Ischemic/infarcted bowel Laparotomy with bowel resection 2/11 Laparotomy for fascial closure 2/13  Wound dehiscence 2/18 Cirrhosis - EtOH/NASH Ascites Dysphagia due to AMS P:   SUP: Pantoprazole DC TPN after current bag Advance TFs to goal Post op and wound mgmt per CCS  HEMATOLOGIC A:   Anemia without overt bleeding Thrombocytopenia, resolved P:  DVT px: SQ heparin Monitor CBC intermittently Transfuse per usual ICU guidelines  INFECTIOUS A:   Septic shock 2nd to peritonitis New severe sepsis 2/25, of unclear etiology Elevated PCT of unclear etiology P:   All micro reviewed  vanc 2/25>>>2/27 Zosyn 2/20 >> 2/29  Resp cx 3/01 >> few candida  ENDOCRINE A:   Stress, TPN induced hyperglycemia P:   Continue SSI   NEUROLOGIC A:   Hx of depression Post-op pain Deconditioning Mixed delirium Stupor/obtundation - suspect hepatic encephalopathy, improved P:   RASS goal: 0 Cont lactulose and rifaximin   Wife updated @ bedside.    CC time 35 minutes.  Billy Fischeravid Jakel Alphin, MD ; [facility] service Mobile [phone].  After 5:30 PM or weekends, call [phone]

## 2015-01-06 NOTE — Progress Notes (Signed)
eLink Physician-Brief Progress Note Patient Name: Romana Juniperathaniel Steinbach DOB: Mar 21, 1951 MRN: [id number]   Date of Service  01/06/2015  HPI/Events of Note  Called to review KUB for Panda tube placement. Panda tube in proximal stomach with side port near GE junction.   eICU Interventions   Please advance Panda to small bowel if possible and repeat KUB.     Intervention Category Intermediate Interventions: Communication with other healthcare providers and/or family  Lenell AntuSommer,Kai Calico Eugene 01/06/2015, 10:16 PM

## 2015-01-06 NOTE — Progress Notes (Signed)
14 Days Post-Op  Subjective: Agitated, tolerating tube feeds Objective: Vital signs in last 24 hours: Temp:  [97.8 F (36.6 C)-98.6 F (37 C)] 97.8 F (36.6 C) (03/03 0400) Pulse Rate:  [80-133] 84 (03/03 0731) Resp:  [14-37] 22 (03/03 0731) BP: (161-179)/(58-65) 161/58 mmHg (03/03 0731) SpO2:  [93 %-100 %] 97 % (03/03 0731) Arterial Line BP: (75-207)/(38-76) 114/46 mmHg (03/03 0600) FiO2 (%):  [30 %] 30 % (03/03 0732) Weight:  [249 lb 12.5 oz (113.3 kg)] 249 lb 12.5 oz (113.3 kg) (03/03 0441) Last BM Date: 12/30/14  Intake/Output from previous day: 03/02 0701 - 03/03 0700 In: 2537.2 [I.V.:1157.2; NG/GT:520; TPN:860] Out: 3110 [Urine:1610; Stool:1500] Intake/Output this shift:    GI: eakins pouch in place with serous drainage, retentions in place, skin macerated  Lab Results:   Recent Labs  01/05/15 0420 01/06/15 0445  WBC 17.5* 17.3*  HGB 7.7* 8.1*  HCT 22.0* 23.4*  PLT 145* 132*   BMET  Recent Labs  01/05/15 0420 01/06/15 0445  NA 144 152*  K 5.0 4.7  CL 115* 123*  CO2 22 21  GLUCOSE 206* 201*  BUN 129* 144*  CREATININE 2.41* 2.27*  CALCIUM 7.6* 7.7*   PT/INR  Recent Labs  01/06/15 0445  LABPROT 21.9*  INR 1.90*   ABG No results for input(s): PHART, HCO3 in the last 72 hours.  Invalid input(s): PCO2, PO2  Studies/Results: Dg Chest Port 1 View  01/06/2015   CLINICAL DATA:  Shortness of breath, respiratory failure  EXAM: PORTABLE CHEST - 1 VIEW  COMPARISON:  Portable chest x-ray of 01/05/2015  FINDINGS: There is little change in bibasilar opacities most consistent with atelectasis. Small effusions cannot be excluded. The tip of the endotracheal tube is unchanged in position as is the right PICC line. Cardiomegaly is stable.  IMPRESSION: No significant change in bibasilar opacities most consistent with atelectasis and possible small effusions. No change in position of endotracheal tube.   Electronically Signed   By: Dwyane Dee M.D.   On: 01/06/2015  07:05   Dg Chest Port 1 View  01/05/2015   CLINICAL DATA:  Respiratory failure  EXAM: PORTABLE CHEST - 1 VIEW  COMPARISON:  Portable chest x-ray of 01/04/2015 and 01/03/2015  FINDINGS: The tip of the endotracheal tube is approximately 3.0 cm above the carina. A right PICC line remains with the tip overlying the mid SVC. Bibasilar opacities persist some a which is due to atelectasis but pneumonia particularly in the left lung base cannot be excluded. Cardiomegaly is stable.  IMPRESSION: 1. Tip of endotracheal tube 3.0 cm above the carina. 2. PICC line tip overlies the mid SVC. 3. Persistent bibasilar opacities consistent with atelectasis. Pneumonia at the left lung base cannot be excluded   Electronically Signed   By: Dwyane Dee M.D.   On: 01/05/2015 07:16    Anti-infectives: Anti-infectives    Start     Dose/Rate Route Frequency Ordered Stop   01/05/15 1100  rifaximin (XIFAXAN) tablet 550 mg     550 mg Per Tube Every 12 hours 01/05/15 1002     12/30/14 1000  vancomycin (VANCOCIN) 1,250 mg in sodium chloride 0.9 % 250 mL IVPB  Status:  Discontinued     1,250 mg 166.7 mL/hr over 90 Minutes Intravenous Every 24 hours 12/30/14 0939 01/01/15 0645   12/26/14 0930  vancomycin (VANCOCIN) 1,250 mg in sodium chloride 0.9 % 250 mL IVPB  Status:  Discontinued     1,250 mg 166.7 mL/hr over 90  Minutes Intravenous Every 24 hours 12/25/14 0912 12/28/14 0850   12/25/14 1000  piperacillin-tazobactam (ZOSYN) IVPB 3.375 g     3.375 g 12.5 mL/hr over 240 Minutes Intravenous Every 8 hours 12/25/14 0921 01/03/15 2359   12/25/14 0930  vancomycin (VANCOCIN) 1,750 mg in sodium chloride 0.9 % 500 mL IVPB     1,750 mg 250 mL/hr over 120 Minutes Intravenous  Once 12/25/14 0912 12/25/14 1208   12/25/14 0900  piperacillin-tazobactam (ZOSYN) IVPB 3.375 g  Status:  Discontinued     3.375 g 100 mL/hr over 30 Minutes Intravenous 3 times per day 12/25/14 0852 12/25/14 0921   12/07/2014 2200  [MAR Hold]  rifaximin (XIFAXAN)  tablet 550 mg  Status:  Discontinued     (MAR Hold since 12/15/2014 1721)   550 mg Oral 2 times daily 12/26/2014 1634 12/26/2014 1726   12/09/2014 1430  piperacillin-tazobactam (ZOSYN) IVPB 3.375 g     3.375 g 12.5 mL/hr over 240 Minutes Intravenous 3 times per day 12/17/2014 1403 12/24/14 0347      Assessment/Plan: POD 21/20/14 s/p elap with sbr from ischemia, second look/closure, dehiscence with repair  1. No good way to manage dehiscence now, retentions need to stay in to prevent evisceration and eakins pouch only real way to do this.  Still with 1500 cc drainage related to other medical issues.   2. Can increase tube feeds as tolerated with attempt to get off tna 3. Can give lactulose via ng or enema   [facility],Kyle West 01/06/2015

## 2015-01-06 NOTE — Procedures (Signed)
Extubation Procedure Note  Patient Details:   Name: Romana Juniperathaniel Regner DOB: [birth year] MRN: [id number]   Airway Documentation:     Evaluation  O2 sats: stable throughout Complications: No apparent complications Patient did tolerate procedure well. Bilateral Breath Sounds: Clear, Diminished Suctioning: Airway Yes   Patient extubated to 4lnc. Vital signs stable at this time. No complications. Patient tolerated well. Wife and RN at bedside. RT will continue to monitor.  Ave Filterdkins, Lunette Tapp Williams 01/06/2015, 9:28 AM

## 2015-01-06 NOTE — Progress Notes (Signed)
Serum sodium on PM labs up to 153 Currently getting D5W at 50/hr Has only received 1 bolus of free water via tube today at 8:30 this AM despite Q4H order ( "waiting for KUB" )(just done non-obstr BGP) Will increase D5W to 75/hour until can consistently get free water enterally  Camille Balynthia Rileyann Florance, MD [facility] [phone] Pager 01/06/2015, 7:19 PM

## 2015-01-06 NOTE — Progress Notes (Signed)
Patient ID: Kyle West, male   DOB: Aug 02, 1951, 64 y.o.   MRN: [id number] S:no changes overnight O:BP 161/58 mmHg  Pulse 92  Temp(Src) 94.4 F (34.7 C) (Oral)  Resp 23  Ht  (1.854 m)  Wt 113.3 kg (249 lb 12.5 oz)  BMI 32.96 kg/m2  SpO2 94%  Intake/Output Summary (Last 24 hours) at 01/06/15 0818 Last data filed at 01/06/15 0800  Gross per 24 hour  Intake 2331.19 ml  Output   3210 ml  Net -878.81 ml   Intake/Output: I/O last 3 completed shifts: In: 4208.1 [I.V.:1408.1; NG/GT:780] Out: 5185 [Urine:2235; Drains:750; Stool:2200]  Intake/Output this shift:  Total I/O In: 144.8 [I.V.:44.8; NG/GT:40; TPN:60] Out: 160 [Urine:160] Weight change: -0.1 kg (-3.5 oz) NWG:[id number] ill-appearing WM intubted and unresponsive CVS:no rub Resp:scattered rhonchi QIO:NGEXB'M pouch in place, +BS Ext:+anasarca   Recent Labs Lab 12/30/14 1915 12/31/14 0520  01/01/15 0330  01/02/15 0001 01/02/15 0345 01/03/15 0330 01/04/15 0340 01/04/15 1359 01/05/15 0420 01/06/15 0445  NA 120* 117*  < > 118*  < > 126* 127* 135 143 145 144 152*  K 3.5 3.1*  < > 3.4*  < > 3.8 3.7 3.5 4.3 4.8 5.0 4.7  CL 97 88*  < > 86*  < > 97 97 104 111 115* 115* 123*  CO2 11* 18*  < > 25  < > GLUCOSE 147* 195*  < > 200*  < > 187* 201* 204* 180* 185* 206* 201*  BUN 95* 95*  < > 93*  < > 94* 89* 90* 105* 112* 129* 144*  CREATININE 2.28* 2.27*  < > 2.22*  < > 2.03* 1.99* 1.82* 1.99* 2.12* 2.41* 2.27*  ALBUMIN 1.3* 1.3*  --  1.0*  --   --  1.0* <1.0*  --  <1.0* 1.0* 1.1*  CALCIUM 6.6* 6.4*  < > 6.6*  < > 6.6* 6.7* 7.3* 7.6* 7.6* 7.6* 7.7*  PHOS 5.8* 5.3*  --   --   --   --   --  6.2* 6.9* 6.9* 7.3* 7.5*  AST 90* 130*  --  136*  --   --  121* 117*  --   --  186* 221*  ALT 39 53  --  57*  --   --  49 48  --   --  52 60*  < > = values in this interval not displayed. Liver Function Tests:  Recent Labs Lab 01/03/15 0330 01/04/15 1359 01/05/15 0420 01/06/15 0445  AST 117*  --  186*  221*  ALT 48  --  52 60*  ALKPHOS 154*  --  201* 202*  BILITOT 3.0*  --  3.6* 4.9*  PROT 3.6*  --  3.8* 4.1*  ALBUMIN <1.0* <1.0* 1.0* 1.1*   No results for input(s): LIPASE, AMYLASE in the last 168 hours. No results for input(s): AMMONIA in the last 168 hours. CBC:  Recent Labs Lab 01/02/15 0345 01/03/15 0330 01/04/15 0340 01/05/15 0420 01/06/15 0445  WBC 23.1* 17.9* 18.0* 17.5* 17.3*  NEUTROABS  --  13.0*  --   --   --   HGB 7.7* 7.5* 7.4* 7.7* 8.1*  HCT 21.6* 21.1* 21.0* 22.0* 23.4*  MCV 92.3 93.8 94.6 96.5 96.7  PLT 185 160 151 145* 132*   Cardiac Enzymes:  Recent Labs Lab 12/30/14 0917 12/30/14 1400 12/30/14 1915  TROPONINI <0.03 0.06* 0.05*   CBG:  Recent Labs Lab 01/05/15 1210 01/05/15 1622 01/05/15 2024  01/05/15 2347 01/06/15 0448  GLUCAP 164* 200* 193* 179* 202*    Iron Studies: No results for input(s): IRON, TIBC, TRANSFERRIN, FERRITIN in the last 72 hours. Studies/Results: Dg Chest Port 1 View  01/06/2015   CLINICAL DATA:  Shortness of breath, respiratory failure  EXAM: PORTABLE CHEST - 1 VIEW  COMPARISON:  Portable chest x-ray of 01/05/2015  FINDINGS: There is little change in bibasilar opacities most consistent with atelectasis. Small effusions cannot be excluded. The tip of the endotracheal tube is unchanged in position as is the right PICC line. Cardiomegaly is stable.  IMPRESSION: No significant change in bibasilar opacities most consistent with atelectasis and possible small effusions. No change in position of endotracheal tube.   Electronically Signed   By: Dwyane Dee M.D.   On: 01/06/2015 07:05   Dg Chest Port 1 View  01/05/2015   CLINICAL DATA:  Respiratory failure  EXAM: PORTABLE CHEST - 1 VIEW  COMPARISON:  Portable chest x-ray of 01/04/2015 and 01/03/2015  FINDINGS: The tip of the endotracheal tube is approximately 3.0 cm above the carina. A right PICC line remains with the tip overlying the mid SVC. Bibasilar opacities persist some a which is  due to atelectasis but pneumonia particularly in the left lung base cannot be excluded. Cardiomegaly is stable.  IMPRESSION: 1. Tip of endotracheal tube 3.0 cm above the carina. 2. PICC line tip overlies the mid SVC. 3. Persistent bibasilar opacities consistent with atelectasis. Pneumonia at the left lung base cannot be excluded   Electronically Signed   By: Dwyane Dee M.D.   On: 01/05/2015 07:16   . antiseptic oral rinse  7 mL Mouth Rinse QID  . chlorhexidine  15 mL Mouth Rinse BID  . feeding supplement (PIVOT 1.5 CAL)  1,000 mL Per Tube Q24H  . heparin subcutaneous  5,000 Units Subcutaneous 3 times per day  . insulin aspart  0-15 Units Subcutaneous 6 times per day  . ipratropium-albuterol  3 mL Nebulization Q6H  . lactulose  30 g Per Tube BID  . pantoprazole sodium  40 mg Per Tube Q24H  . rifaximin  550 mg Per Tube Q12H  . sodium chloride  10-40 mL Intracatheter Q12H    BMET    Component Value Date/Time   NA 152* 01/06/2015 0445   K 4.7 01/06/2015 0445   CL 123* 01/06/2015 0445   CO2 21 01/06/2015 0445   GLUCOSE 201* 01/06/2015 0445   BUN 144* 01/06/2015 0445   CREATININE 2.27* 01/06/2015 0445   CALCIUM 7.7* 01/06/2015 0445   GFRNONAA 29* 01/06/2015 0445   GFRAA 33* 01/06/2015 0445   CBC    Component Value Date/Time   WBC 17.3* 01/06/2015 0445   RBC 2.42* 01/06/2015 0445   HGB 8.1* 01/06/2015 0445   HCT 23.4* 01/06/2015 0445   PLT 132* 01/06/2015 0445   MCV 96.7 01/06/2015 0445   MCH 33.5 01/06/2015 0445   MCHC 34.6 01/06/2015 0445   RDW 17.5* 01/06/2015 0445   LYMPHSABS 1.0 01/03/2015 0330   MONOABS 3.1* 01/03/2015 0330   EOSABS 0.6 01/03/2015 0330   BASOSABS 0.1 01/03/2015 0330     Assessment/Plan:  1. AKI/CKD- had been improving toward his baseline (1.3-1.5), decreased pressor requirements but now with rising Scr. Worrisome for hepatorenal syndrome. Will check FeNa, continue with current management. He is not a candidate for dialysis and would not offer as  this would do nothing to reverse the underlying problem which is cirrhosis and profound protein malnutrition and anasarca.  1. Scr improved somewhat, continue with conservative therapy 2. Hyponatremia- improved with added solutes to TNA and now with hypernatremia.  1. Discontinue sodium added to TNA for today and recheck labs tonight and in am and reconsider sodium for next TNA 2. Free water deficit is ~ 5.8l   3. Add free water boluses via NGT 250cc q4 hrs. 3. 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 lactulose Via NGT per primary svc 4. VDRF-per PCCM 5. SIRS- weaning pressors as tolerated 6. S/p bowel resection- still with sig drainage from open wound, has eakin's pouch per CCS 7. Severe protein malnutrition- on tna 8. ABLA- follow and transfuse prn 9. Dispo- poor prognosis with multi-organ failure and severe malnutrition recommend palliative care/hospice consult  Rishita Petron A

## 2015-01-07 DIAGNOSIS — I959 Hypotension, unspecified: Secondary | ICD-10-CM

## 2015-01-07 LAB — COMPREHENSIVE METABOLIC PANEL
ALK PHOS: 178 U/L — AB (ref 39–117)
ALT: 64 U/L — ABNORMAL HIGH (ref 0–53)
ANION GAP: 12 (ref 5–15)
AST: 221 U/L — AB (ref 0–37)
Albumin: 1.1 g/dL — ABNORMAL LOW (ref 3.5–5.2)
BILIRUBIN TOTAL: 6.1 mg/dL — AB (ref 0.3–1.2)
BUN: 133 mg/dL — ABNORMAL HIGH (ref 6–23)
CHLORIDE: 113 mmol/L — AB (ref 96–112)
CO2: 16 mmol/L — AB (ref 19–32)
Calcium: 7.5 mg/dL — ABNORMAL LOW (ref 8.4–10.5)
Creatinine, Ser: 2.24 mg/dL — ABNORMAL HIGH (ref 0.50–1.35)
GFR calc Af Amer: 34 mL/min — ABNORMAL LOW (ref >90)
GFR, EST NON AFRICAN AMERICAN: 29 mL/min — AB (ref >90)
GLUCOSE: 485 mg/dL — AB (ref 70–99)
POTASSIUM: 4.3 mmol/L (ref 3.5–5.1)
SODIUM: 141 mmol/L (ref 135–145)
Total Protein: 4.1 g/dL — ABNORMAL LOW (ref 6.0–8.3)

## 2015-01-07 LAB — CBC
HEMATOCRIT: 22.2 % — AB (ref 39.0–52.0)
HEMOGLOBIN: 7.6 g/dL — AB (ref 13.0–17.0)
MCH: 32.6 pg (ref 26.0–34.0)
MCHC: 34.2 g/dL (ref 30.0–36.0)
MCV: 95.3 fL (ref 78.0–100.0)
Platelets: 122 10*3/uL — ABNORMAL LOW (ref 150–400)
RBC: 2.33 MIL/uL — ABNORMAL LOW (ref 4.22–5.81)
RDW: 18.2 % — AB (ref 11.5–15.5)
WBC: 16 10*3/uL — AB (ref 4.0–10.5)

## 2015-01-07 LAB — GLUCOSE, CAPILLARY
GLUCOSE-CAPILLARY: 122 mg/dL — AB (ref 70–99)
GLUCOSE-CAPILLARY: 233 mg/dL — AB (ref 70–99)
GLUCOSE-CAPILLARY: 126 mg/dL — AB (ref 70–99)
GLUCOSE-CAPILLARY: 396 mg/dL — AB (ref 70–99)
Glucose-Capillary: 300 mg/dL — ABNORMAL HIGH (ref 70–99)
Glucose-Capillary: 134 mg/dL — ABNORMAL HIGH (ref 70–99)

## 2015-01-07 MED ORDER — FREE WATER: 250.0000 mL | Freq: Four times a day (QID) | Status: DC

## 2015-01-07 MED ORDER — HALOPERIDOL LACTATE 5 MG/ML IJ SOLN: 1.0000 mg | INTRAMUSCULAR | Status: DC | PRN

## 2015-01-07 MED ORDER — FAMOTIDINE IN NACL 20-0.9 MG/50ML-% IV SOLN
20.0000 mg | INTRAVENOUS | Status: DC
  Administered 2015-01-07: 20 mg via INTRAVENOUS
  Filled 2015-01-07 (×2): qty 50

## 2015-01-07 MED ORDER — HYDROMORPHONE HCL 1 MG/ML IJ SOLN
1.0000 mg | INTRAMUSCULAR | Status: DC | PRN
  Administered 2015-01-07 (×2): 1 mg via INTRAVENOUS
  Filled 2015-01-07 (×2): qty 1

## 2015-01-07 MED ORDER — INSULIN ASPART 100 UNIT/ML ~~LOC~~ SOLN
0.0000 [IU] | Freq: Three times a day (TID) | SUBCUTANEOUS | Status: DC
  Administered 2015-01-07: 2 [IU] via SUBCUTANEOUS

## 2015-01-07 MED ORDER — INSULIN ASPART 100 UNIT/ML ~~LOC~~ SOLN: 0.0000 [IU] | Freq: Every day | SUBCUTANEOUS | Status: DC

## 2015-01-07 MED ORDER — DEXTROSE-NACL 5-0.45 % IV SOLN
INTRAVENOUS | Status: DC
  Administered 2015-01-07 (×2): via INTRAVENOUS

## 2015-01-07 MED ORDER — SODIUM CHLORIDE 0.9 % IV SOLN
510.0000 mg | INTRAVENOUS | Status: DC
  Administered 2015-01-07: 510 mg via INTRAVENOUS
  Filled 2015-01-07: qty 17

## 2015-01-07 NOTE — Evaluation (Signed)
Physical Therapy Evaluation Patient Details Name: Kyle West MRN: [id number] DOB: 1951-08-05 Today's Date: 01/07/2015   History of Present Illness  Pt presents with Ischemic bowel and has had an ex lap with re-exploration, followed by dehisence.  Pt with sepsis and VDRF reintubated on 2/25 and extubated on 01/06/15. pt with hx of Cirrhosis.HTN, CAD s/p CABG.    Clinical Impression  Pt admitted with above diagnosis. Pt currently with functional limitations due to the deficits listed below (see PT Problem List).  Pt will benefit from skilled PT to increase their independence and safety with mobility to allow discharge to the venue listed below.       Follow Up Recommendations CIR (if activity tolerance improves.)    Equipment Recommendations  Other (comment) (to be assessed.)    Recommendations for Other Services OT consult     Precautions / Restrictions Precautions Precautions: Fall Precaution Comments: Open abdomen with suction.      Mobility  Bed Mobility Overal bed mobility: Needs Assistance Bed Mobility: Supine to Sit;Sit to Supine;Rolling Rolling: Mod assist   Supine to sit: HOB elevated;+2 for physical assistance;Total assist Sit to supine: +2 for physical assistance;Total assist   General bed mobility comments: Assist bring legs off of bed and elevate trunk into sitting. Assist to bring legs back up into bed and lower trunk. Pt did perform bridge when we were straightening pads under hips.  Transfers                    Ambulation/Gait                Stairs            Wheelchair Mobility    Modified Rankin (Stroke Patients Only)       Balance Overall balance assessment: Needs assistance Sitting-balance support: Bilateral upper extremity supported Sitting balance-Leahy Scale: Poor Sitting balance - Comments: Sat EOB x 8 minutes with min A. Verbal cues to sit more erect and hold head up.                                      Pertinent Vitals/Pain Pain Assessment: Faces Faces Pain Scale: No hurt    Home Living Family/patient expects to be discharged to:: Unsure Living Arrangements: Spouse/significant other               Additional Comments: Unsure. Pt not answering questions.    Prior Function Level of Independence: Independent               Hand Dominance   Dominant Hand: Right    Extremity/Trunk Assessment   Upper Extremity Assessment: Defer to OT evaluation           Lower Extremity Assessment: RLE deficits/detail;LLE deficits/detail RLE Deficits / Details: grossly 2+/5 LLE Deficits / Details: grossly 2+/5  Cervical / Trunk Assessment: Kyphotic  Communication   Communication: HOH  Cognition Arousal/Alertness: Awake/alert Behavior During Therapy: Flat affect Overall Cognitive Status: Difficult to assess Area of Impairment: Following commands;Problem solving       Following Commands: Follows one step commands inconsistently     Problem Solving: Slow processing;Decreased initiation;Requires verbal cues;Requires tactile cues      General Comments      Exercises        Assessment/Plan    PT Assessment Patient needs continued PT services  PT Diagnosis Difficulty walking;Generalized weakness   PT Problem List  Decreased strength;Decreased activity tolerance;Decreased balance;Decreased mobility;Decreased cognition;Decreased knowledge of use of DME;Decreased knowledge of precautions;Decreased safety awareness  PT Treatment Interventions DME instruction;Gait training;Functional mobility training;Therapeutic activities;Therapeutic exercise;Balance training;Patient/family education;Cognitive remediation   PT Goals (Current goals can be found in the Care Plan section) Acute Rehab PT Goals Patient Stated Goal: pt did not state PT Goal Formulation: Patient unable to participate in goal setting Time For Goal Achievement: 01/21/15 Potential to Achieve Goals: Fair     Frequency Min 3X/week   Barriers to discharge        Co-evaluation               End of Session   Activity Tolerance: Patient limited by fatigue Patient left: in bed;with call bell/phone within reach;with bed alarm set Nurse Communication: Mobility status         Time: [id number] PT Time Calculation (min) (ACUTE ONLY): 15 min   Charges:   PT Evaluation $Initial PT Evaluation Tier I: 1 Procedure     PT G Codes:        Mainor Hellmann 01/07/2015, 2:58 PM  [facility] Rehfeldt PT [phone]

## 2015-01-07 NOTE — Progress Notes (Signed)
After drinking some milk and sprite, pt gurgling in throat.  Encouraged to cough and clear throat.  Suctioned mouth.  Pt still gurgling.  O2 sats 93% on Rm air.  Notified EL[facility] nurse who will pass message on to MD.  Pt also c/o pain in abdominal incision.  Surgeon notified and orders received.

## 2015-01-07 NOTE — Progress Notes (Signed)
Patient temperature 92.7, confirmed via rectal thermometer.  Urine output has decreased to approximately 10 mL/hr.  Called [employer] and Web designerinformed secretary of same, Diplomatic Services operational officersecretary advised will pass message along to MD. [name], Valorie Mcgrory A, RN

## 2015-01-07 NOTE — Progress Notes (Signed)
PULMONARY / CRITICAL CARE MEDICINE   Name: Kyle West MRN: [id number] DOB: 11-15-1950    ADMISSION DATE:  12/28/2014   REFERRING MD :  [facility]  CHIEF COMPLAINT:  abd pain   INITIAL PRESENTATION:  64yo male with hx HTN, CAD s/p CABG, cirrhosis of unclear etiology ([facility]), reported recent hx pancreatitis.  Presented to OSH 2/11 with 2 day hx worsening abd pain, nausea and vomiting.  CT at outside hospital revealed pneumatosis, hepatic and portal venous gas and infrarenal AAA.  [facility] and taken to OR 2/11 for exp lap r/t ischemic bowel/SBO.  Course c/b Afib with RVR and abd wound dehiscence on 2/18 with return to OR.  On 2/25 developed AMS, worsening hypotension and acidosis and PCCM re-consulted.   MAJOR EVENTS: 2/11 exploratory laparotomy, small bowel resection, application of wound vac (Dr [name]). Left intubated post op. Post op hypotension requiring CVL placement, volume resuscitation, vasopressors 2/12 sinus tachycardia, new onset AFRVR. Vasopressors changed from NE to PE. Low dose metoprolol initiated 2/13 back to OR, side-to-side small bowel anastomosis & closure of fascia.  Persistent AFib with RVR. Levo added. 2/15 extubated, off pressors 2/16 d/c amiodarone 2/18 Abd wound dehiscence, Return to OR  2/25 AMS, acidotic, hypotensive, tx ICU and PCCM re consulted.   2/27 off 3% NS 2/29 Dexmedetomidine initiated 3/02 Persistent obtundation. Precedex DC'd. Suspected hepatic encephalopathy. Begin lactulose and rifaximin. Pt's wife raised issue of advanced directives and reported pt's previous wishes for DNR in event of arrest. Plan to continue aggressive support but without escalation (including no HD of any form) and no ACLS/CPR 3/03 Extubated. Mild - mod agitated delirium > PRN Haldol 3/04 Tolerating extubation. Comfortable on RA. Remains on norepinephrine. LOC improved. + F/C. Removed his own NGT. SLP eval ordered  TESTS: 2/11 CT abd/pelvis >> pneumatosis SB  with moderate ascites, infrarenal AAA, cirrhosis 2/24 CTAP: No findings specific for bowel ischemia on this noncontrast examination. No pneumatosis. Scattered areas of small bowel and colonic wall thickening,No evidence of intra abdominal abscess. Small volume pneumoperitoneum with scattered foci of free air beneath the anterior abdominal wall, likely related to recent laparotomy. 4.2 cm infrarenal abdominal aortic aneurysm. Cirrhosis 2/27 Echo >> EF 55 to 60%, grade 1 diastolic dysfx  SUBJECTIVE:  RASS 0. + F/C.   VITAL SIGNS: Temp:  [95.9 F (35.5 C)-98.1 F (36.7 C)] 95.9 F (35.5 C) (03/04 1200) Pulse Rate:  [96-128] 96 (03/04 1300) Resp:  [17-28] 20 (03/04 1300) BP: (81-127)/(26-58) 109/36 mmHg (03/04 1300) SpO2:  [97 %-100 %] 100 % (03/04 1300) Weight:  [108 kg (238 lb 1.6 oz)] 108 kg (238 lb 1.6 oz) (03/04 0500) INTAKE / OUTPUT:  Intake/Output Summary (Last 24 hours) at 01/07/15 1431 Last data filed at 01/07/15 1300  Gross per 24 hour  Intake 3396.08 ml  Output   3795 ml  Net -398.92 ml    PHYSICAL EXAMINATION: General: NAD Neuro:  Diffusely weak, no focal deficits HEENT: WNL Cardiovascular: reg, no M Lungs: clear anteriorly  Abdomen:  Soft, open wound site clean  Ext: 1+ BUE edema, no LE edema  LABS: I have reviewed all of today's lab results. Relevant abnormalities are discussed in the A/P section  CXR: NNF   ASSESSMENT / PLAN:  PULMONARY ETT 2/11 >> 2/15 ETT 2/25 >> 3/03 A: Acute respiratory failure, recurrent - appears resolved Hx of OSA P:   Supp O2 to maintain SpO2 > 93% Cont bronchodilators DNI status needs to be confirmed with  wife (was discussed prior to extubation but she seemed a little ambivalent)  CARDIOVASCULAR Rt femoral CVL 2/25 >> 2/29 RUE PICC 2/29 >>  Rt radial A-line 2/27 >> 3/03 A: Shock/persistent hypotension - unclear etiology PAF/RVR > NSR AAA - nondissecting Hx of CAD s/p CABG, HTN, HLD P:  Cont NE as needed to maintain  MAP > 60 mmHg Recheck PCT 3/05 DNR if arrests  RENAL A:   AKI - not a candidate for HD Lactic acidosis, resolved Hyponatremia, resolved Hypernatremia, resolved P:   Monitor BMET intermittently Monitor I/Os Correct electrolytes as indicated IVFs adjusted 3/04  GASTROINTESTINAL A:   Ischemic/infarcted bowel Laparotomy with bowel resection 2/11 Laparotomy for fascial closure 2/13  Wound dehiscence 2/18 Cirrhosis - EtOH/NASH Ascites  Dysphagia due to AMS Difficult NGT placement P:   SUP: Famotidine SLP eval  if fails, replace NGT and resume TFs and meds per tube  If passes, advance diet and change meds to PO Post op and wound mgmt per CCS  HEMATOLOGIC A:   Anemia without overt bleeding Thrombocytopenia, resolved P:  DVT px: SQ heparin Monitor CBC intermittently Transfuse per usual ICU guidelines  INFECTIOUS A:   Septic shock  Peritonitis, appears resolved New severe sepsis 2/25, of unclear etiology - apepars resolved Elevated PCT of unclear etiology P:   All micro reviewed  vanc 2/25>>>2/27 Zosyn 2/20 >> 2/29  Resp cx 3/01 >> few candida  Recheck PCT 3/05  ENDOCRINE A:   Hyperglycemia without prior dx of DM P:   Continue SSI  Consider adding Lantus when nutrition resumed  NEUROLOGIC A:   Hx of depression Post-op pain Deconditioning Mixed delirium Suspect hepatic encephalopathy, improved P:   RASS goal: 0 Cont lactulose and rifaximin when able   Billy Fischer, MD ; [facility] service Mobile [phone].  After 5:30 PM or weekends, call [phone]

## 2015-01-07 NOTE — Progress Notes (Addendum)
Elink MD called at [phone]. Asked Elink MD to advise on placement of panda tube based on xray. Elink MD asked RN to advance panda 10-15 cm. When RN walked back into pt room, pt partially pulled out Panda tube. RN tried to advance, without success. RN attempted to insert new panda tube, but CO2 was detected, so RN pulled tube out. Elink MD Marchelle Gearing(Ramaswamy) asked RN to hold panda and oral meds, and to have AM MD's reassess. Will continue to monitor patient.

## 2015-01-07 NOTE — Progress Notes (Signed)
[employer]-Link nurse camera'd in room, discussed with RN that pt has gurgling in throat.  Per day shift RN, this occurred after patient drank some milk and sprite. O2 sats 94% on room air. E-Link nurse will pass message on to MD. [name], Annie Saephan A, RN

## 2015-01-07 NOTE — Evaluation (Signed)
Clinical/Bedside Swallow Evaluation Patient Details  Name: Kyle West MRN: [id number] Date of Birth: [birth year]  Today's Date: 01/07/2015 Time: SLP Start Time (ACUTE ONLY): 1510 SLP Stop Time (ACUTE ONLY): 1535 SLP Time Calculation (min) (ACUTE ONLY): 25 min  Past Medical History:  Past Medical History  Diagnosis Date  . Cirrhosis   . CAD (coronary artery disease)   . Hypertension   . Pancreatitis   . Depression   . AAA (abdominal aortic aneurysm) without rupture    Past Surgical History:  Past Surgical History  Procedure Laterality Date  . Hernia repair    . Coronary artery bypass graft    . Laparotomy N/A 12/30/2014    Procedure: EXPLORATORY LAPAROTOMY;  Surgeon: Harriette Bouillon, MD;  Location: [facility] OR;  Service: General;  Laterality: N/A;  . Bowel resection N/A 12/07/2014    Procedure: SMALL BOWEL RESECTION;  Surgeon: Harriette Bouillon, MD;  Location: [facility] OR;  Service: General;  Laterality: N/A;  . Application of wound vac N/A 12/13/2014    Procedure: APPLICATION OF WOUND VAC;  Surgeon: Harriette Bouillon, MD;  Location: [facility] OR;  Service: General;  Laterality: N/A;  . Laparotomy N/A 12/11/2014    Procedure: EXPLORATORY LAPAROTOMY;  Surgeon: Harriette Bouillon, MD;  Location[facility] OR;  Service: General;  Laterality: N/A;  . Laparotomy N/A 2015-01-05    Procedure: EXPLORATORY LAPAROTOMY WITH CLOSURE OF EVISCERATION;  Surgeon: Harriette Bouillon, MD;  Location: [facility] OR;  Service: General;  Laterality: N/A;   HPI:  Pt presents with Ischemic bowel and has had an ex lap with re-exploration, followed by dehisence. Pt with sepsis and VDRF reintubated on 2/25 and extubated on 01/06/15. pt with hx of Cirrhosis.HTN, CAD s/p CABG.    Assessment / Plan / Recommendation Clinical Impression  Patient presents with a oropharyngeal dysphagia that appears primarily cognitive-based. He pulled his PANDA out yesterday and so MD wanted to have a BSE to determine if he was ready for P.O.'s. Patient tolerated cup and straw  sips of thin liquids, required tactile and visual cues to initiate taking sips. He did not exhibit any overt s/s aspiration with thin liquids, however he can be impulsive with intake so that will need to be monitored. Patient exhibited oral delays with puree solids, with minimal oral residue post swallow, which cleared with cued second swallow. Patient exhibited impaired mastication and manipulation of solid bolus, with mild-moderate oral residuals post swallow.    Aspiration Risk  Mild    Diet Recommendation Thin liquid   Liquid Administration via: Cup;Straw Medication Administration: Whole meds with puree Supervision: Staff to assist with self feeding;Full supervision/cueing for compensatory strategies Compensations: Slow rate;Small sips/bites;Check for pocketing Postural Changes and/or Swallow Maneuvers: Seated upright 90 degrees    Other  Recommendations Oral Care Recommendations: Oral care BID   Follow Up Recommendations  24 hour supervision/assistance;Inpatient Rehab    Frequency and Duration min 2x/week  2 weeks   Pertinent Vitals/Pain     SLP Swallow Goals     Swallow Study Prior Functional Status       General Date of Onset: 12/17/2014 HPI: Pt presents with Ischemic bowel and has had an ex lap with re-exploration, followed by dehisence. Pt with sepsis and VDRF reintubated on 2/25 and extubated on 01/06/15. pt with hx of Cirrhosis.HTN, CAD s/p CABG.  Type of Study: Bedside swallow evaluation Previous Swallow Assessment: N/A Diet Prior to this Study: NPO Temperature Spikes Noted: No History of Recent Intubation: Yes Length of Intubations (days): 8 days Date extubated: 01/06/15  Behavior/Cognition: Alert;Confused;Requires cueing;Doesn't follow directions;Hard of hearing;Decreased sustained attention Oral Cavity - Dentition: Missing dentition;Dentures, not available Self-Feeding Abilities: Total assist;Needs assist Patient Positioning: Upright in bed Baseline Vocal  Quality: Other (comment) (mostly aphonic but he did vocalize/verbalize once and this appeared clear) Volitional Cough: Weak Volitional Swallow: Unable to elicit    Oral/Motor/Sensory Function Overall Oral Motor/Sensory Function: Other (comment) (difficult to fully assess secondary to patient's poor attention and ability to follow directions) Labial ROM: Within Functional Limits Labial Symmetry: Within Functional Limits Lingual Symmetry: Within Functional Limits Facial ROM: Within Functional Limits   Ice Chips Ice chips: Not tested   Thin Liquid Thin Liquid: Impaired Presentation: Cup;Straw Pharyngeal  Phase Impairments: Suspected delayed Swallow    Nectar Thick Nectar Thick Liquid: Not tested   Honey Thick Honey Thick Liquid: Not tested   Puree Puree: Impaired Presentation: Spoon Oral Phase Impairments: Reduced lingual movement/coordination;Impaired anterior to posterior transit Oral Phase Functional Implications: Oral holding   Solid   GO    Solid: Impaired Oral Phase Impairments: Impaired anterior to posterior transit;Impaired mastication;Poor awareness of bolus;Reduced lingual movement/coordination Oral Phase Functional Implications: Oral residue       Pablo Lawrencereston, Bartley Vuolo Tarrell 01/07/2015,5:09 PM   Pablo LawrenceJohn Tarrell Khiem Gargis, MA, CCC-SLP

## 2015-01-07 NOTE — Progress Notes (Signed)
NUTRITION FOLLOW UP  Intervention:    -If advanced to a diet and not eating well, recommend Boost Plus BID providing 260 kcal and 14 g protein if on thin liquids. -If on thickened liquids provide Magic Cup TID  -If need for initiation of tube feeding, recommend initiating Pivot 1.5 via NGT @ 20 ml/hr, increase by 10 ml every 4 hours until at goal rate of 60 ml/hr providing 2160 kcal, 135 g protein and 1092 ml free water daily   Nutrition Dx:   Inadequate oral intake related to inability to eat as evidenced by NPO status; ongoing  Goal:   Intake to meet >90% of estimated nutrition needs; not met.  Monitor:   TF tolerance, vent status, labs, weight trends.  Assessment:   64yo male with hx HTN, CAD s/p CABG, cirrhosis, reported recent hx pancreatitis. Presented with 2 day hx worsening abd pain, nausea and vomiting.  SP ex lap with small bowel resection due to ischemic bowel and application of wound VAC on 2/11.  Pt tx to 2H due to trouble breathing, pt intubated 2/25 now in septic shock.Pt started on TPN 2/12 due to ileus.   TPN regimen (Clinimix with LYTES 5/15) was decreased to 60 mL/hr, lipids d/c'ed. TPN regimen provides a total of 1022 kcal and 72 gm protein.   Post pyloric tube was placed and trickle feeds initiated on 01/02/15- good position confirmed by CXR. Pivot 1.5 increased to 40 ml/hr 3/2 (which provides 1440 kcals, 90 grams protein, and 728 ml fluid).   Total Nutrition Regimen provides:  2462 kcal and 162 grams protein   3/4: pt extubated 3/3 remains NPO. Pt pulled out NG tube last night.  SLP will evaluate swallowing, may be advanced to diet or tube replaced.  Will leave recommendations for both situations. TPN d/c'd.  Spoke with daughter,  Pt drinks chocolate Boost at home.  Pt remains with eakin's pouch to measure peritoneal output and keep up with fluid loss. Output remains less than 1.5 L (3/1 1400 ml). Pouch was changed this AM per [place] RN.   Labs reviewed.  Magnesium, Phosphorus, BUN, and Cr elevated CBG's: 164-233  Per MD note Pt now DNR, continue aggressive support but without escalation (no HD).   Height: Ht Readings from Last 1 Encounters:  12/28/14 _0  (1.854 m)    Weight Status:   Wt Readings from Last 1 Encounters:  01/07/15 238 lb 1.6 oz (108 kg)   12/31/14 244 lb 4.3 oz (110.8 kg)    12/21/14 252 lb 10.4 oz (114.6 kg)       12/17/14 235 lb 14.3 oz (107 kg)           Re-estimated needs:  Kcal: 2000-2200 kcal Protein: 130-145 grams Fluid: >2.2 L  Skin: SDTI on rt nose, stage II pressure ulcer on bilateral buttocks, closed abdominal incision  Diet Order:     Intake/Output Summary (Last 24 hours) at 01/07/15 0921 Last data filed at 01/07/15 0600  Gross per 24 hour  Intake 2924.42 ml  Output   4335 ml  Net -1410.58 ml    Last BM: 2/26  Labs:   Recent Labs Lab 01/04/15 0340 01/04/15 1359 01/05/15 0420 01/06/15 0445 01/06/15 1825 01/07/15 0500  NA 143 145 144 152* 153* 141  K 4.3 4.8 5.0 4.7 4.6 4.3  CL 111 115* 115* 123* 125* 113*  CO2 _1 16*  BUN 105* 112* 129* 144* 149* 133*  CREATININE 1.99* 2.12* 2.41* 2.27*  2.35* 2.24*  CALCIUM 7.6* 7.6* 7.6* 7.7* 7.8* 7.5*  MG 2.3  --  2.7* 2.9*  --   --   PHOS 6.9* 6.9* 7.3* 7.5*  --   --   GLUCOSE 180* 185* 206* 201* 152* 485*    CBG (last 3)   Recent Labs  01/06/15 2322 01/07/15 0315 01/07/15 0803  GLUCAP 162* 122* 233*    Scheduled Meds: . antiseptic oral rinse  7 mL Mouth Rinse QID  . chlorhexidine  15 mL Mouth Rinse BID  . feeding supplement (PIVOT 1.5 CAL)  1,000 mL Per Tube Q24H  . free water  250 mL Per Tube Q4H  . heparin subcutaneous  5,000 Units Subcutaneous 3 times per day  . insulin aspart  0-15 Units Subcutaneous 6 times per day  . ipratropium-albuterol  3 mL Nebulization Q6H  . lactulose  30 g Per Tube BID  . pantoprazole sodium  40 mg Per Tube Q24H  . rifaximin  550 mg Per Tube Q12H  . sodium  chloride  10-40 mL Intracatheter Q12H    Continuous Infusions: . sodium chloride Stopped (01/06/15 1026)  . dextrose 75 mL/hr at 01/07/15 0348  . norepinephrine (LEVOPHED) Adult infusion 17 mcg/min (01/07/15 2182)    Elmer Picker MS Dietetic Intern Pager Number [phone]

## 2015-01-07 NOTE — Progress Notes (Signed)
Patient ID: Kyle West, male   DOB: 12/06/1950, 64 y.o.   MRN: [id number] S:extubated but confused O:BP 93/41 mmHg  Pulse 99  Temp(Src) 96.1 F (35.6 C) (Axillary)  Resp 22  Ht 6\' 1"  (1.854 m)  Wt 108 kg (238 lb 1.6 oz)  BMI 31.42 kg/m2  SpO2 99%  Intake/Output Summary (Last 24 hours) at 01/07/15 1116 Last data filed at 01/07/15 1000  Gross per 24 hour  Intake 2932.61 ml  Output   3370 ml  Net -437.39 ml   Intake/Output: I/O last 3 completed shifts: In: 5135.7 [I.V.:2215.7; NG/GT:1180] Out: 6195 [Urine:1795; Stool:4400]  Intake/Output this shift:  Total I/O In: 268.9 [I.V.:268.9] Out: 285 [Urine:185; Stool:100] Weight change: -5.3 kg (-11 lb 11 oz) Gen:WD disheveled WM awake but confused CVS:no rub Resp:decreased BS at bases Abd:no change Ext:+edema   Recent Labs Lab 01/01/15 0330  01/02/15 0345 01/03/15 0330 01/04/15 0340 01/04/15 1359 01/05/15 0420 01/06/15 0445 01/06/15 1825 01/07/15 0500  NA 118*  < > 127* 135 143 145 144 152* 153* 141  K 3.4*  < > 3.7 3.5 4.3 4.8 5.0 4.7 4.6 4.3  CL 86*  < > 97 104 111 115* 115* 123* 125* 113*  CO2 25  < > 24 27 22 24 22 21 22  16*  GLUCOSE 200*  < > 201* 204* 180* 185* 206* 201* 152* 485*  BUN 93*  < > 89* 90* 105* 112* 129* 144* 149* 133*  CREATININE 2.22*  < > 1.99* 1.82* 1.99* 2.12* 2.41* 2.27* 2.35* 2.24*  ALBUMIN 1.0*  --  1.0* <1.0*  --  <1.0* 1.0* 1.1*  --  1.1*  CALCIUM 6.6*  < > 6.7* 7.3* 7.6* 7.6* 7.6* 7.7* 7.8* 7.5*  PHOS  --   --   --  6.2* 6.9* 6.9* 7.3* 7.5*  --   --   AST 136*  --  121* 117*  --   --  186* 221*  --  221*  ALT 57*  --  49 48  --   --  52 60*  --  64*  < > = values in this interval not displayed. Liver Function Tests:  Recent Labs Lab 01/05/15 0420 01/06/15 0445 01/07/15 0500  AST 186* 221* 221*  ALT 52 60* 64*  ALKPHOS 201* 202* 178*  BILITOT 3.6* 4.9* 6.1*  PROT 3.8* 4.1* 4.1*  ALBUMIN 1.0* 1.1* 1.1*   No results for input(s): LIPASE, AMYLASE in the last 168 hours. No  results for input(s): AMMONIA in the last 168 hours. CBC:  Recent Labs Lab 01/03/15 0330 01/04/15 0340 01/05/15 0420 01/06/15 0445 01/07/15 0500  WBC 17.9* 18.0* 17.5* 17.3* 16.0*  NEUTROABS 13.0*  --   --   --   --   HGB 7.5* 7.4* 7.7* 8.1* 7.6*  HCT 21.1* 21.0* 22.0* 23.4* 22.2*  MCV 93.8 94.6 96.5 96.7 95.3  PLT 160 151 145* 132* 122*   Cardiac Enzymes: No results for input(s): CKTOTAL, CKMB, CKMBINDEX, TROPONINI in the last 168 hours. CBG:  Recent Labs Lab 01/06/15 1939 01/06/15 1941 01/06/15 2322 01/07/15 0315 01/07/15 0803  GLUCAP 262* 138* 162* 122* 233*    Iron Studies: No results for input(s): IRON, TIBC, TRANSFERRIN, FERRITIN in the last 72 hours. Studies/Results: Dg Chest Port 1 View  01/06/2015   CLINICAL DATA:  Shortness of breath, respiratory failure  EXAM: PORTABLE CHEST - 1 VIEW  COMPARISON:  Portable chest x-ray of 01/05/2015  FINDINGS: There is little change in bibasilar opacities most  consistent with atelectasis. Small effusions cannot be excluded. The tip of the endotracheal tube is unchanged in position as is the right PICC line. Cardiomegaly is stable.  IMPRESSION: No significant change in bibasilar opacities most consistent with atelectasis and possible small effusions. No change in position of endotracheal tube.   Electronically Signed   By: Dwyane Dee M.D.   On: 01/06/2015 07:05   Dg Abd Portable 1v  01/06/2015   CLINICAL DATA:  Nasogastric tube placement.  EXAM: PORTABLE ABDOMEN - 1 VIEW  COMPARISON:  01/02/2015.  FINDINGS: 1821 hours. A feeding tube is visualized with its tip in the left upper quadrant, consistent with position in the proximal stomach. No nasogastric tube visualized. The bowel gas pattern is nonobstructive.  IMPRESSION: Feeding tube tip in the proximal stomach.   Electronically Signed   By: Carey Bullocks M.D.   On: 01/06/2015 18:38   . antiseptic oral rinse  7 mL Mouth Rinse QID  . chlorhexidine  15 mL Mouth Rinse BID  . famotidine  (PEPCID) IV  20 mg Intravenous Q24H  . feeding supplement (PIVOT 1.5 CAL)  1,000 mL Per Tube Q24H  . free water  250 mL Per Tube Q4H  . heparin subcutaneous  5,000 Units Subcutaneous 3 times per day  . insulin aspart  0-15 Units Subcutaneous 6 times per day  . ipratropium-albuterol  3 mL Nebulization Q6H  . lactulose  30 g Per Tube BID  . rifaximin  550 mg Per Tube Q12H  . sodium chloride  10-40 mL Intracatheter Q12H    BMET    Component Value Date/Time   NA 141 01/07/2015 0500   K 4.3 01/07/2015 0500   CL 113* 01/07/2015 0500   CO2 16* 01/07/2015 0500   GLUCOSE 485* 01/07/2015 0500   BUN 133* 01/07/2015 0500   CREATININE 2.24* 01/07/2015 0500   CALCIUM 7.5* 01/07/2015 0500   GFRNONAA 29* 01/07/2015 0500   GFRAA 34* 01/07/2015 0500   CBC    Component Value Date/Time   WBC 16.0* 01/07/2015 0500   RBC 2.33* 01/07/2015 0500   HGB 7.6* 01/07/2015 0500   HCT 22.2* 01/07/2015 0500   PLT 122* 01/07/2015 0500   MCV 95.3 01/07/2015 0500   MCH 32.6 01/07/2015 0500   MCHC 34.2 01/07/2015 0500   RDW 18.2* 01/07/2015 0500   LYMPHSABS 1.0 01/03/2015 0330   MONOABS 3.1* 01/03/2015 0330   EOSABS 0.6 01/03/2015 0330   BASOSABS 0.1 01/03/2015 0330    Assessment/Plan:  1. AKI/CKD- had been improving toward his baseline (1.3-1.5), decreased pressor requirements but now with rising Scr. Worrisome for hepatorenal syndrome. Will check FeNa, continue with current management. He is not a candidate for dialysis and would not offer as this would do nothing to reverse the underlying problem which is cirrhosis and profound protein malnutrition and anasarca. 1. Scr improved somewhat, continue with conservative therapy 2. Hyponatremia- improved with added solutes to TNA and now with hypernatremia.  1. Discontinued sodium added to TNA for today recheck labs tonight and in am and adjust sodium content for next TNA 2. Due to significant drop in serum sodium after sodium free TNA was given last  night will decrease free water boluses via NGT to 250cc q6 hrs 01/06/15.  3. 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 lactulose Via NGT per primary svc 4. VDRF-per PCCM 5. SIRS- weaning pressors as tolerated 6. S/p bowel resection- still with sig drainage from open wound, has eakin's pouch per CCS 7. Severe protein malnutrition-  on tna 8. ABLA- follow and transfuse prn  9. Iron deficiency- would recommend IV Feraheme  IV x 2 doses over the next 8 days 10. Dispo- poor prognosis with multi-organ failure and severe malnutrition recommend palliative care/hospice consult  Marsia Cino A

## 2015-01-07 NOTE — Progress Notes (Signed)
eLink Physician-Brief Progress Note Patient Name: Kyle West DOB: [birth year] MRN: [id number]   Date of Service  01/07/2015  HPI/Events of Note  Nurse calling with report of hypothermia temp of 92.2.  Also concern that patient aspirated earlier and has been coughing unable to clear secretions.  Sats are 94% on room air.  Scarlette ShortsOliguira but has advanced renal failure.  eICU Interventions  Plan: Bair Hugger for hypothermia NTS prn Monitor UOP Patient is a limited code        DETERDING,ELIZABETH 01/07/2015, 9:32 PM

## 2015-01-07 NOTE — Consult Note (Signed)
WOC wound consult note Reason for Consult: Changed Eakin pouch; previous one applied on Wed. CCS following for assessment and plan of care to abd wound. Emina, PA at bedside to assess wound. Wound type: Full thickness post-op abd wound to midline; retention sutures must remain in place for a few more weeks, according to CCS. Wound bed: Beefy red; 19X3X8cm in deepest portion which is at 6:00 o'clock of wound when swab inserted. Drainage (amount, consistency, odor) Large amt yellow drainage in bedside drainage bag. Periwound: Retention sutures surrounding wound, beginning to erode skin around sutures. Skin macerated around wound edges to 5 cm surrounding wound. Difficult to maintain pouch seal R/T sutures. Dressing procedure/placement/frequency: Applied large Eakin pouch to contain drainage.Applied low wall suction with NG inserted into pouch to maintain seal. Supplies and directions left at bedside for staff nurses if leakage occurs. Cammie Mcgeeawn Raihana Balderrama MSN, RN, CWOCN, [place]AP, CNS [phone]

## 2015-01-07 NOTE — Progress Notes (Signed)
15 Days Post-Op  Subjective: Extubated, not talking but nods appropriately  Objective: Vital signs in last 24 hours: Temp:  [97.4 F (36.3 C)-98.1 F (36.7 C)] 98.1 F (36.7 C) (03/04 0400) Pulse Rate:  [85-128] 104 (03/04 0600) Resp:  [17-28] 21 (03/04 0600) BP: (96-127)/(26-58) 101/45 mmHg (03/04 0600) SpO2:  [92 %-100 %] 100 % (03/04 0738) Arterial Line BP: (122-162)/(52-66) 145/57 mmHg (03/03 1200) FiO2 (%):  [32 %] 32 % (03/03 0922) Weight:  [238 lb 1.6 oz (108 kg)] 238 lb 1.6 oz (108 kg) (03/04 0500) Last BM Date: 01/07/15  Intake/Output from previous day: 03/03 0701 - 03/04 0700 In: 3319.2 [I.V.:1599.2; NG/GT:700; TPN:1020] Out: 4495 [Urine:945; Stool:3550] Intake/Output this shift:    GI: eakins pouch in place with maceration retention sutures  Lab Results:   Recent Labs  01/06/15 0445 01/07/15 0500  WBC 17.3* 16.0*  HGB 8.1* 7.6*  HCT 23.4* 22.2*  PLT 132* 122*   BMET  Recent Labs  01/06/15 1825 01/07/15 0500  NA 153* 141  K 4.6 4.3  CL 125* 113*  CO2 22 16*  GLUCOSE 152* 485*  BUN 149* 133*  CREATININE 2.35* 2.24*  CALCIUM 7.8* 7.5*   PT/INR  Recent Labs  01/06/15 0445  LABPROT 21.9*  INR 1.90*   ABG No results for input(s): PHART, HCO3 in the last 72 hours.  Invalid input(s): PCO2, PO2  Studies/Results: Dg Chest Port 1 View  01/06/2015   CLINICAL DATA:  Shortness of breath, respiratory failure  EXAM: PORTABLE CHEST - 1 VIEW  COMPARISON:  Portable chest x-ray of 01/05/2015  FINDINGS: There is little change in bibasilar opacities most consistent with atelectasis. Small effusions cannot be excluded. The tip of the endotracheal tube is unchanged in position as is the right PICC line. Cardiomegaly is stable.  IMPRESSION: No significant change in bibasilar opacities most consistent with atelectasis and possible small effusions. No change in position of endotracheal tube.   Electronically Signed   By: Dwyane Dee M.D.   On: 01/06/2015 07:05    Dg Abd Portable 1v  01/06/2015   CLINICAL DATA:  Nasogastric tube placement.  EXAM: PORTABLE ABDOMEN - 1 VIEW  COMPARISON:  01/02/2015.  FINDINGS: 1821 hours. A feeding tube is visualized with its tip in the left upper quadrant, consistent with position in the proximal stomach. No nasogastric tube visualized. The bowel gas pattern is nonobstructive.  IMPRESSION: Feeding tube tip in the proximal stomach.   Electronically Signed   By: Carey Bullocks M.D.   On: 01/06/2015 18:38    Anti-infectives: Anti-infectives    Start     Dose/Rate Route Frequency Ordered Stop   01/05/15 1100  rifaximin (XIFAXAN) tablet 550 mg     550 mg Per Tube Every 12 hours 01/05/15 1002     12/30/14 1000  vancomycin (VANCOCIN) 1,250 mg in sodium chloride 0.9 % 250 mL IVPB  Status:  Discontinued     1,250 mg 166.7 mL/hr over 90 Minutes Intravenous Every 24 hours 12/30/14 0939 01/01/15 0645   12/26/14 0930  vancomycin (VANCOCIN) 1,250 mg in sodium chloride 0.9 % 250 mL IVPB  Status:  Discontinued     1,250 mg 166.7 mL/hr over 90 Minutes Intravenous Every 24 hours 12/25/14 0912 12/28/14 0850   12/25/14 1000  piperacillin-tazobactam (ZOSYN) IVPB 3.375 g     3.375 g 12.5 mL/hr over 240 Minutes Intravenous Every 8 hours 12/25/14 0921 01/03/15 2359   12/25/14 0930  vancomycin (VANCOCIN) 1,750 mg in sodium chloride 0.9 %  500 mL IVPB     1,750 mg 250 mL/hr over 120 Minutes Intravenous  Once 12/25/14 0912 12/25/14 1208   12/25/14 0900  piperacillin-tazobactam (ZOSYN) IVPB 3.375 g  Status:  Discontinued     3.375 g 100 mL/hr over 30 Minutes Intravenous 3 times per day 12/25/14 0852 12/25/14 0921   09/06/2015 2200  [MAR Hold]  rifaximin (XIFAXAN) tablet 550 mg  Status:  Discontinued     (MAR Hold since 09/06/2015 1721)   550 mg Oral 2 times daily 09/06/2015 1634 09/06/2015 1726   12/25/2014 1430  piperacillin-tazobactam (ZOSYN) IVPB 3.375 g     3.375 g 12.5 mL/hr over 240 Minutes Intravenous 3 times per day 12/09/2014 1403 12/24/14  0347      Assessment/Plan: POD 22/21/15 s/p elap with sbr from ischemia, second look/closure, dehiscence with repair  1. No good way to manage dehiscence now, retentions need to stay in to prevent evisceration and eakins pouch only real way to do this. when changed today please call so we can evaluate 2. Either needs tube replaced with feeds or passes swallow and can try to take diet   [facility] CenterWAKEFIELD,Chaos Carlile 01/07/2015

## 2015-01-07 NOTE — Progress Notes (Signed)
Discussed with MD patient's temperature, urine output, and concern that patient may have aspirated.  Patient is a limited code, MD reviewed BUN and creatinine levels and advised no new orders for urine output at this time.  Orders received for nasotracheal suctioning and warming blanket. [name], Melita Villalona A, RN

## 2015-01-08 LAB — CBC
HCT: 24.8 % — ABNORMAL LOW (ref 39.0–52.0)
Hemoglobin: 8.5 g/dL — ABNORMAL LOW (ref 13.0–17.0)
MCH: 33.1 pg (ref 26.0–34.0)
MCHC: 34.3 g/dL (ref 30.0–36.0)
MCV: 96.5 fL (ref 78.0–100.0)
Platelets: 130 10*3/uL — ABNORMAL LOW (ref 150–400)
RBC: 2.57 MIL/uL — ABNORMAL LOW (ref 4.22–5.81)
RDW: 18.4 % — ABNORMAL HIGH (ref 11.5–15.5)
WBC: 25.5 10*3/uL — ABNORMAL HIGH (ref 4.0–10.5)

## 2015-01-08 LAB — PROCALCITONIN: Procalcitonin: 5.78 ng/mL

## 2015-01-08 LAB — COMPREHENSIVE METABOLIC PANEL
ALT: 84 U/L — AB (ref 0–53)
AST: 286 U/L — ABNORMAL HIGH (ref 0–37)
Albumin: 1.2 g/dL — ABNORMAL LOW (ref 3.5–5.2)
Alkaline Phosphatase: 194 U/L — ABNORMAL HIGH (ref 39–117)
Anion gap: 16 — ABNORMAL HIGH (ref 5–15)
BUN: 158 mg/dL — ABNORMAL HIGH (ref 6–23)
CALCIUM: 8.6 mg/dL (ref 8.4–10.5)
CHLORIDE: 118 mmol/L — AB (ref 96–112)
CO2: 13 mmol/L — ABNORMAL LOW (ref 19–32)
Creatinine, Ser: 3.25 mg/dL — ABNORMAL HIGH (ref 0.50–1.35)
GFR calc Af Amer: 22 mL/min — ABNORMAL LOW (ref >90)
GFR, EST NON AFRICAN AMERICAN: 19 mL/min — AB (ref >90)
Glucose, Bld: 163 mg/dL — ABNORMAL HIGH (ref 70–99)
Potassium: 6.4 mmol/L (ref 3.5–5.1)
SODIUM: 147 mmol/L — AB (ref 135–145)
TOTAL PROTEIN: 4.4 g/dL — AB (ref 6.0–8.3)
Total Bilirubin: 8.6 mg/dL — ABNORMAL HIGH (ref 0.3–1.2)

## 2015-01-12 ENCOUNTER — Telehealth: Payer: Self-pay

## 2015-01-12 NOTE — Telephone Encounter (Signed)
Received [facility] 3[facility]1/493/9/[facility].  Will send to E-Link for Dr. Sung AmabileSimonds to sign.  Received back 01/12/15.  [facility] 01/13/15.

## 2015-01-28 NOTE — Discharge Summary (Signed)
DEATH SUMMARY  DATE OF ADMISSION:  12/21/2014  DATE OF DISCHARGE/DEATH:  01/08/15  ADMISSION DIAGNOSES:   Severe sepsis Cirrhosis Small bowel pneumatosis Ascites AAA AKI CAD Hyperlipidemia DM 2 Hypotension Lactic acidosis  DISCHARGE DIAGNOSES:   Severe sepsis Cirrhosis Small bowel pneumatosis Small bowel infarction Wound dehiscence Ascites AAA AKI CAD Hyperlipidemia DM 2 Hypotension Lactic acidosis VDRF Hx of OSA Septic shock  Recurrent severe sepsis Peritonitis PAF with RVR Hyponatremia Hypernatremia Dysphagia Anemia without overt blood loss Thrombocytopenia Delirium Deconditioning Hepatic encephalopathy Post op pain    PRESENTATION:   [facility] with the following HPI and the above admission diagnoses:   64yo male with hx HTN, CAD s/p CABG, cirrhosis, reported recent hx pancreatitis. Presented to OSH 2/11 with 2 day hx worsening abd pain, nausea and vomiting. CT at outside hospital revealed pneumatosis and infrarenal AAA. [facility] with concern for ischemic bowel. Has had intermittent generalized abdominal pain, lasting about 1.5 hours each time, then would resolve on its own associated with nausea and vomiting. Initially, he was admitted with a plan for expectant mgmt but with rising lactate and was taken to OR for exploratory laparotomy  HOSPITAL COURSE:   MAJOR EVENTS: 2/11 exploratory laparotomy, small bowel resection, application of wound vac (Dr [name]). Left intubated post op. Post op hypotension requiring CVL placement, volume resuscitation, vasopressors 2/12 sinus tachycardia, new onset AFRVR. Vasopressors changed from NE to PE. Low dose metoprolol initiated 2/13 back to OR, side-to-side small bowel anastomosis & closure of fascia. Persistent AFib with RVR. Levo added. 2/15 extubated, off pressors 2/16 d/c amiodarone 2/18 Abd wound dehiscence, Return to OR  2/25  AMS, acidotic, hypotensive, tx ICU and PCCM re consulted.  2/29 Dexmedetomidine initiated 3/02 Persistent obtundation. Precedex DC'd. Suspected hepatic encephalopathy. Begin lactulose and rifaximin. Pt's wife raised issue of advanced directives and reported pt's previous wishes for DNR in event of arrest. Plan to continue aggressive support but without escalation (including no HD of any form) and no ACLS/CPR 3/03 Extubated. Mild - mod agitated delirium > PRN Haldol 3/04 Tolerating extubation. Comfortable on RA. Remains on norepinephrine. LOC improved. + F/C. Removed his own NGT. SLP eval ordered 3/05 Passed SLP eval and diet initiated. However, suffered apparent aspiration and respiratory distress, oliguria hypothermia, progressive hypotension refractory to vasopressors with subsequent progression to asystole. No ACLS was undertaken per prior discussions. He passed away comfortably. No autopsy was performed   TESTS: 2/11 CTAP:  pneumatosis SB with moderate ascites, infrarenal AAA, cirrhosis 2/24 CTAP: No findings specific for bowel ischemia on this noncontrast examination. No pneumatosis. Scattered areas of small bowel and colonic wall thickening,No evidence of intra abdominal abscess. Small volume pneumoperitoneum with scattered foci of free air beneath the anterior abdominal wall, likely related to recent laparotomy. 4.2 cm infrarenal abdominal aortic aneurysm. Cirrhosis 2/27 Echo: EF 55 to 60%, grade 1 diastolic dysfx   ETT 2/11 >> 2/15 ETT 2/25 >> 3/03 Rt femoral CVL 2/25 >> 2/29 RUE PICC 2/29 >>  Rt radial A-line 2/27 >> 3/03   Billy Fischeravid Agnes Brightbill, MD ; PCCM service

## 2015-02-04 NOTE — Progress Notes (Signed)
CRITICAL VALUE ALERT  Critical value received:  Potassium 6.4  Date of notification: 01/21/2015  Time of notification:  5:56 AM   Critical value read back:Yes.    Nurse who received alert:  Kavin LeechSondra Jacobo Moncrief RN  Meredith Pelalled ELink, advised secretary that I was calling to report a critical lab value.  Secretary connected me to Newell Rubbermaidicki, Charity fundraiserN, reported critical value.  [name], Allsion Nogales A, RN

## 2015-02-04 NOTE — Progress Notes (Signed)
Called MD to inform patient experiencing low BP and Levophed currently at 40 mcg.  MD Deterding advised patient is a DNR and per notes no escalation of care including no additional pressors. [name], Monta Maiorana A, RN

## 2015-02-04 NOTE — Progress Notes (Signed)
Patient's blood pressure is continuing to decrease, Levophed at 50 mcg.  Patient's urine output has significantly decreased, heart rate tachycardic in the low 100's, respiratory rate increased to the 40's.  Called and spoke with MD Deterding to inform of same.  MD advised no escalation in plan of care as patient is a DNR.  Called patient's wife to inform of patient's status.  Mrs. Kyle West requested I call patient's daughter as well, called and informed [name], patient's daughter, of patient's status. [name], [name], RN

## 2015-02-04 NOTE — Progress Notes (Addendum)
Pt asystole on monitor.  Unable to get BP.  No heart sounds or breath sounds.  Confirmed by 2 RNs.  Time of death [id number].  Family at bedside and aware.  Called chaplain.  Dr. Kendrick FriesMcQuaid aware.

## 2015-02-04 NOTE — Progress Notes (Signed)
Pt had died and RN requested I meet with family.  Pt had long hx of heart and liver problems and had been hospitalized for some time.  Wife, 2 daughters and friend present.  I offered pastoral care and comfort and prayer.  Family very appreciative.Launa Flightrnold, Baran Kuhrt, Chaplain  01/09/2015  3:34 PM

## 2015-02-04 NOTE — Progress Notes (Signed)
Pt sats 60-70s, HR 60s, unresponsive. Increased O2 to 4L and placed face mask.  Dr. Kendrick FriesMcQuaid notified and is coming to bedside.  Pt is DNR.  Family at bedside.

## 2015-02-04 DEATH — deceased

## 2015-04-29 IMAGING — US US RENAL
1 series · 14 of 25 positions shown · non-contrast
Comparison: CT abdomen and pelvis 12/29/2014.

CLINICAL DATA: Acute renal injury. Multiple recent laparotomies.
History of cirrhosis, hypertension, pancreatitis.

EXAM:
RENAL/URINARY TRACT ULTRASOUND COMPLETE

[Series 1: us renal · 0.25mm/px · 14 of 26 slices shown]
[im 1/26]
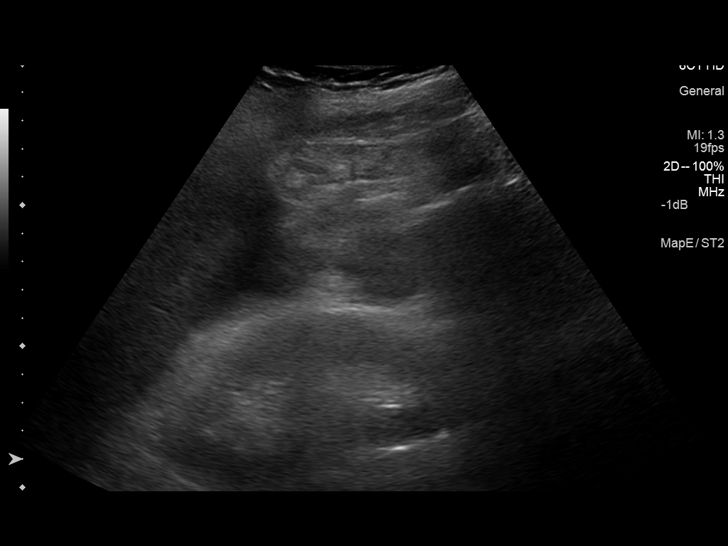
[im 3/26]
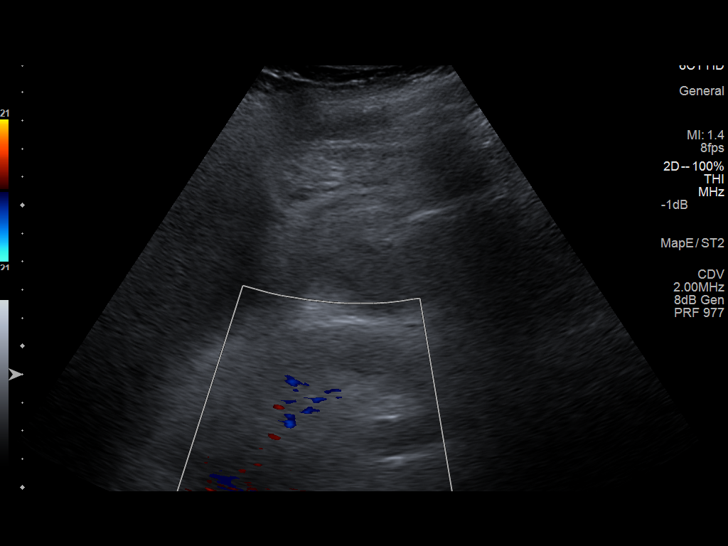
[im 5/26]
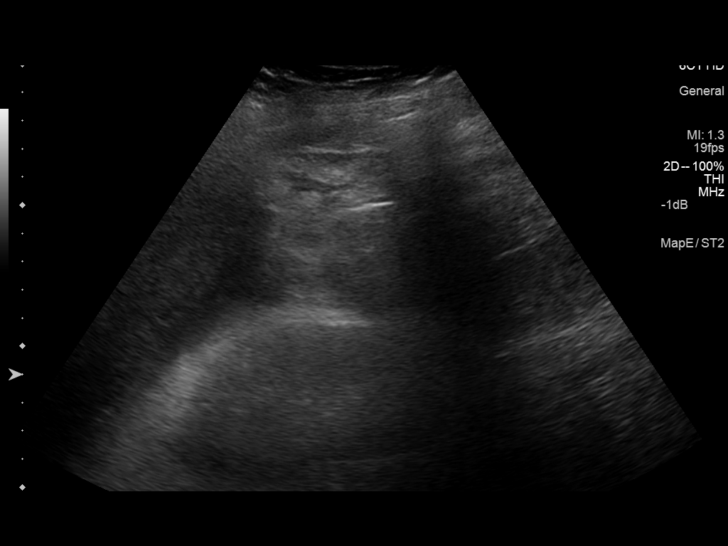
[im 7/26]
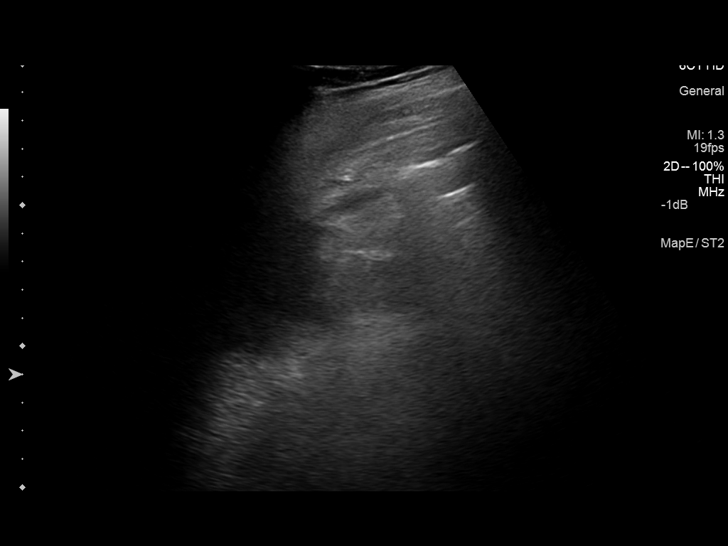
[im 9/26]
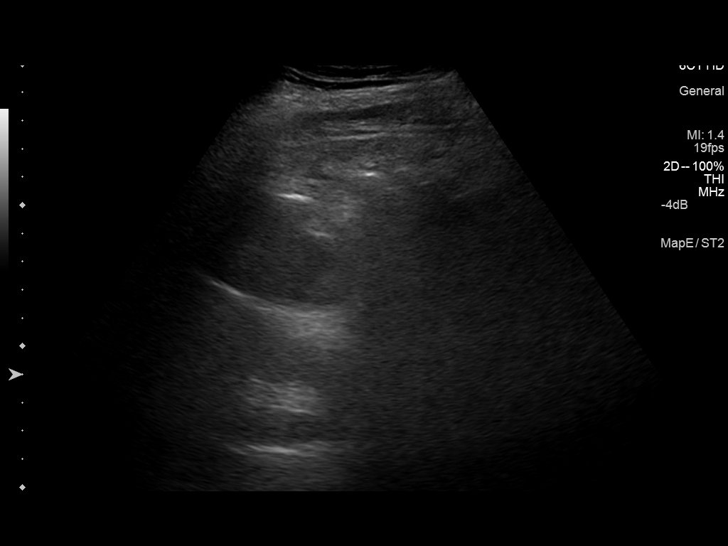
[im 10/26]
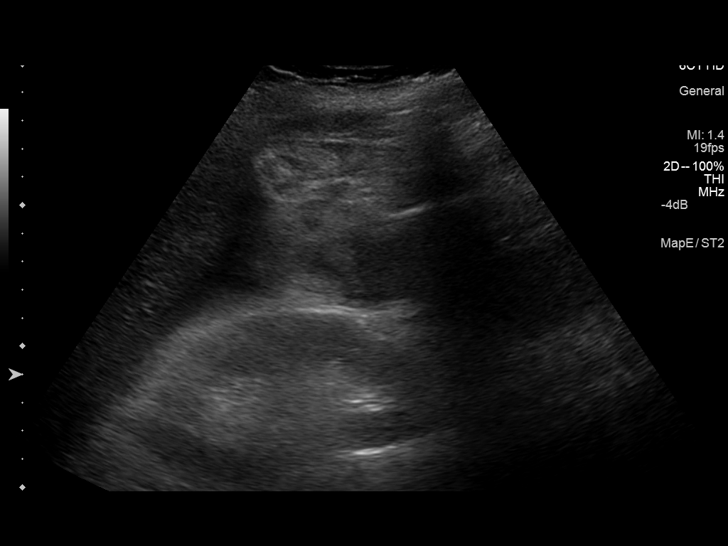
[im 12/26]
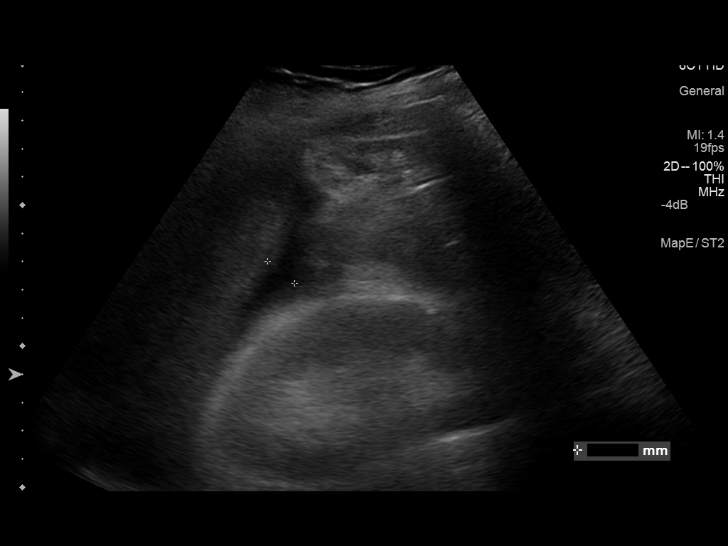
[im 14/26]
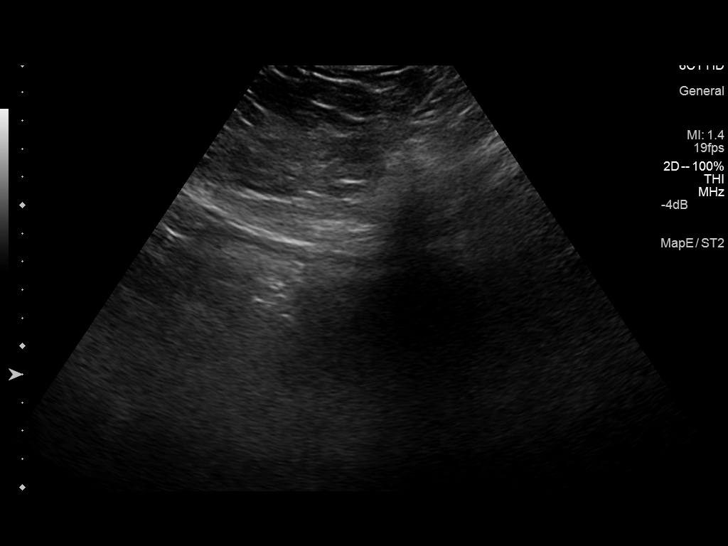
[im 16/26]
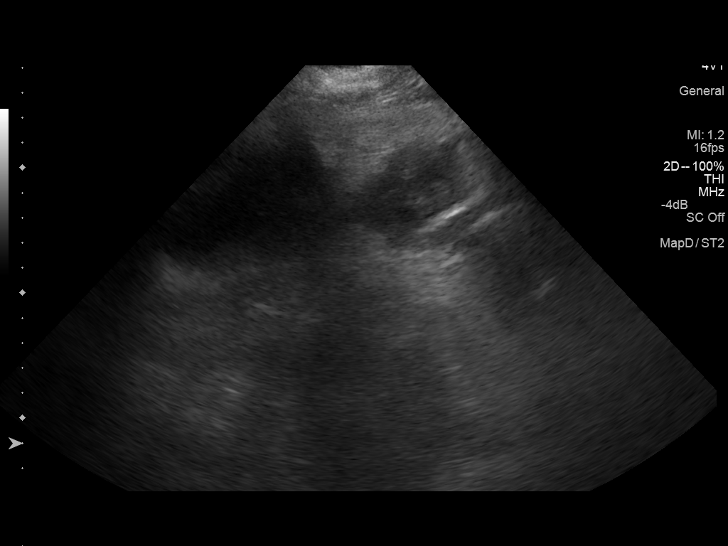
[im 17/26]
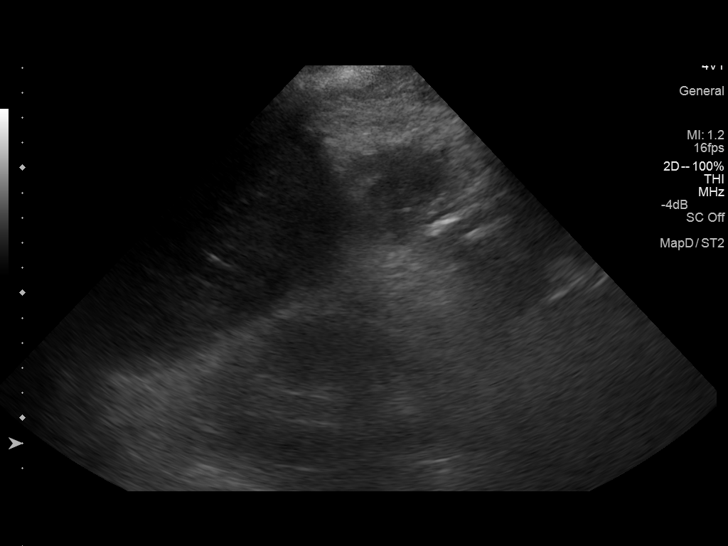
[im 19/26]
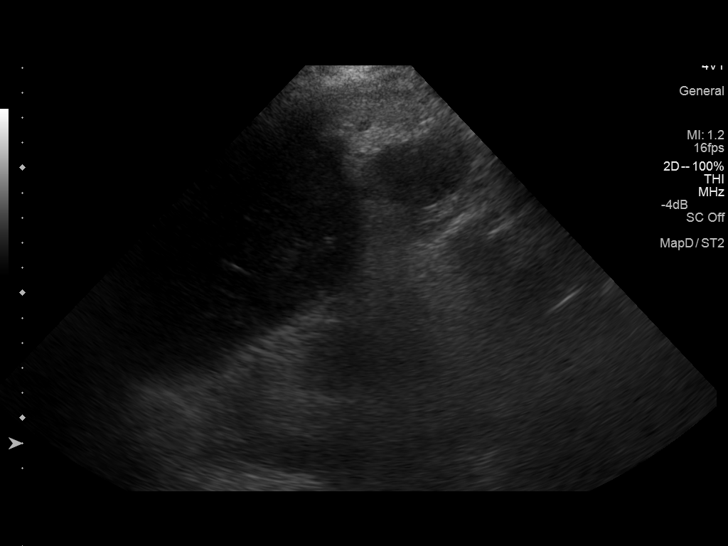
[im 21/26]
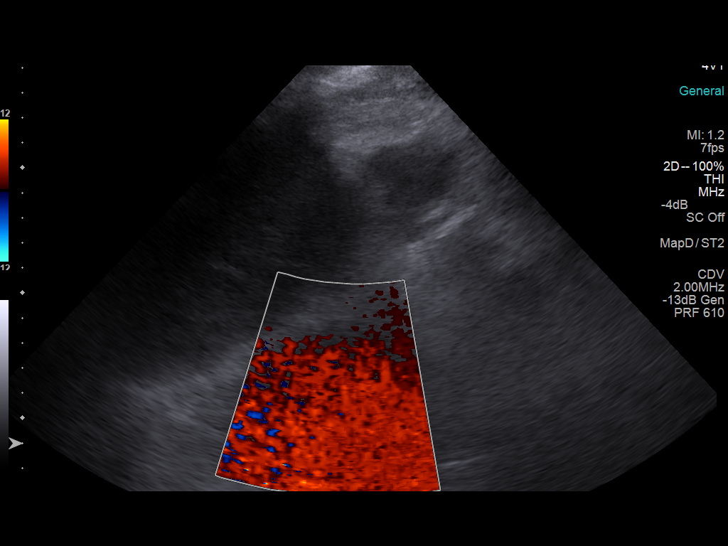
[im 23/26]
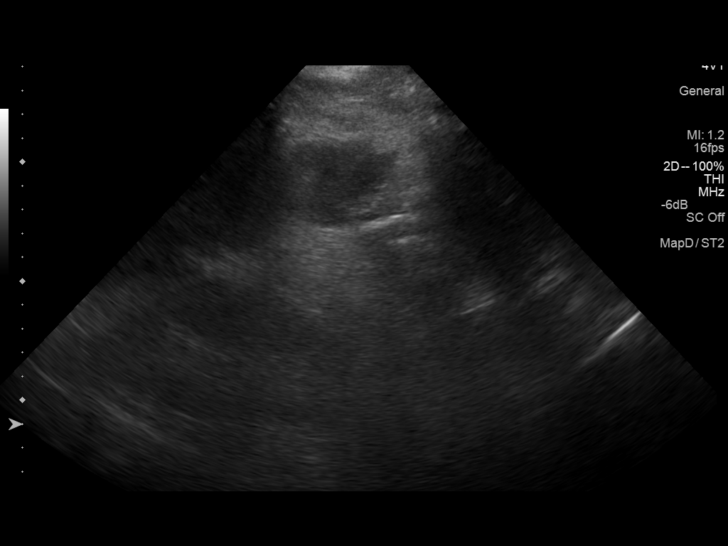
[im 26/26]
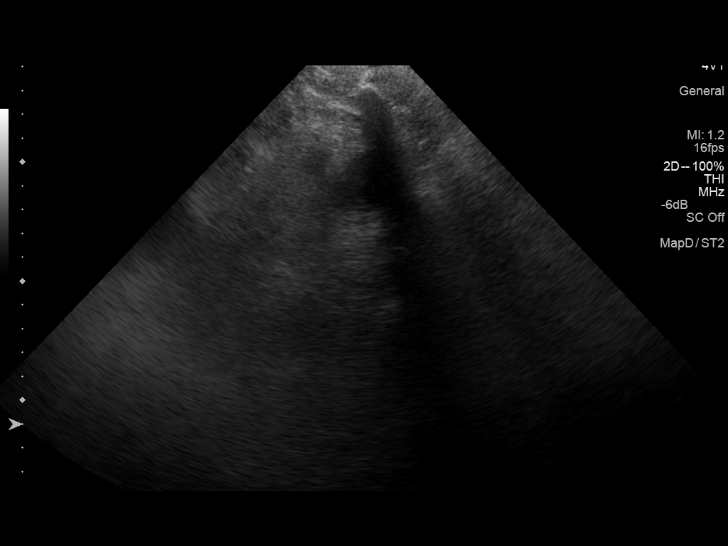

[14 of 25 positions shown; findings below may reference images not displayed]

FINDINGS: Right Kidney:

Length: 12.2 cm. Renal parenchymal echotexture appears diffusely
increased suggesting chronic medical renal disease. No
hydronephrosis or solid mass.

Left Kidney:

Length: 10 cm. Renal parenchymal echotexture appears diffusely
increased suggesting chronic medical renal disease. No
hydronephrosis or solid mass.

Bladder:

Bladder is decompressed with a Foley catheter in is not evaluated.

Incidental note of focal areas of free fluid around the liver
suggesting ascites.
IMPRESSION: Diffusely increased renal parenchymal echotexture suggesting chronic
medical renal disease. No hydronephrosis. Small amount of free fluid
suggesting ascites.

## 2015-05-03 IMAGING — CR DG CHEST 1V PORT
1 series · 1 of 1 positions shown · non-contrast
Comparison: Portable chest x-ray January 02, 2015

CLINICAL DATA: Respiratory failure

EXAM:
PORTABLE CHEST - 1 VIEW

[AP]
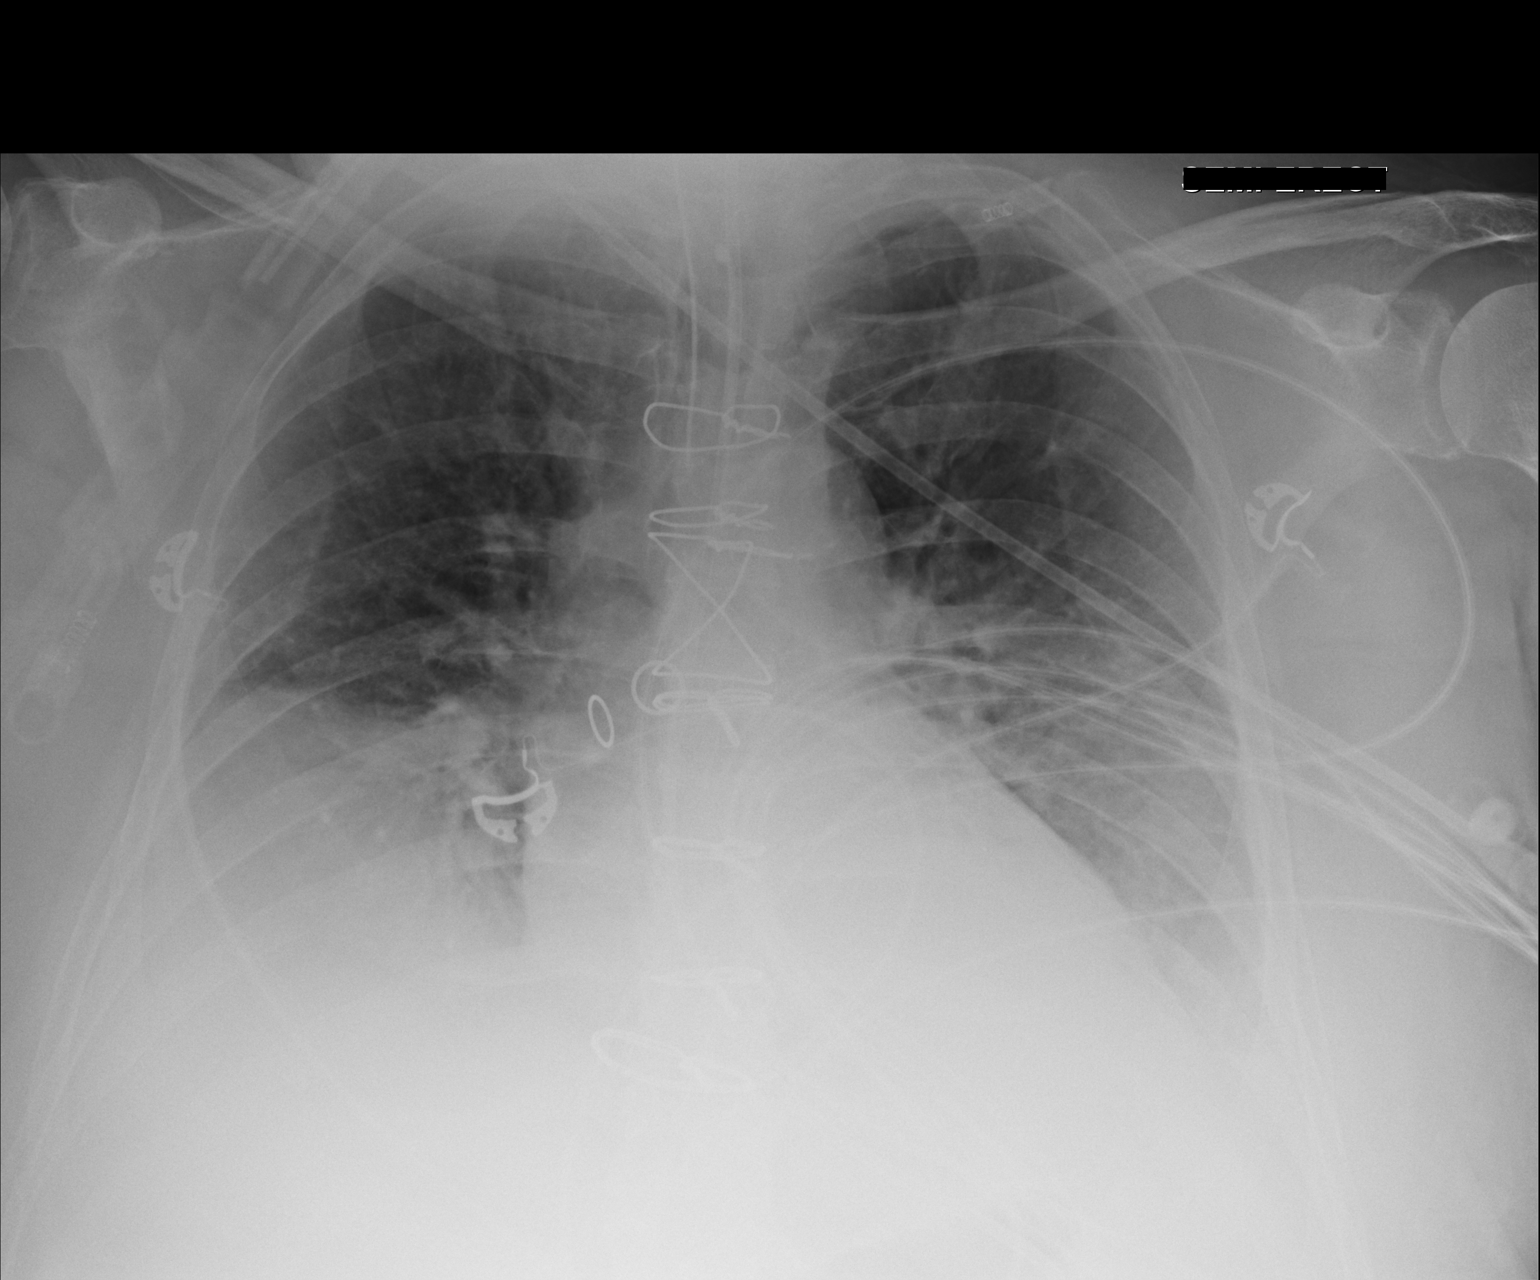

[1 of 1 positions shown; findings below may reference images not displayed]

FINDINGS: Lungs are reasonably well inflated. The interstitial markings are
less conspicuous today. The hemidiaphragms remain obscured. The
cardiac silhouette remains enlarged. The central pulmonary
vascularity is more distinct today. The endotracheal tube tip lies
3.9 cm above the carina. The esophagogastric tube tip projects below
the inferior margin of the image.
IMPRESSION: Slight interval improvement in the appearance of the pulmonary
interstitium consistent with resolving edema. There remain bibasilar
atelectasis and small effusions.

## 2015-05-05 IMAGING — CR DG CHEST 1V PORT
1 series · 1 of 1 positions shown · non-contrast
Comparison: Portable chest x-ray of 01/04/2015 and 01/03/2015

CLINICAL DATA: Respiratory failure

EXAM:
PORTABLE CHEST - 1 VIEW

[AP]
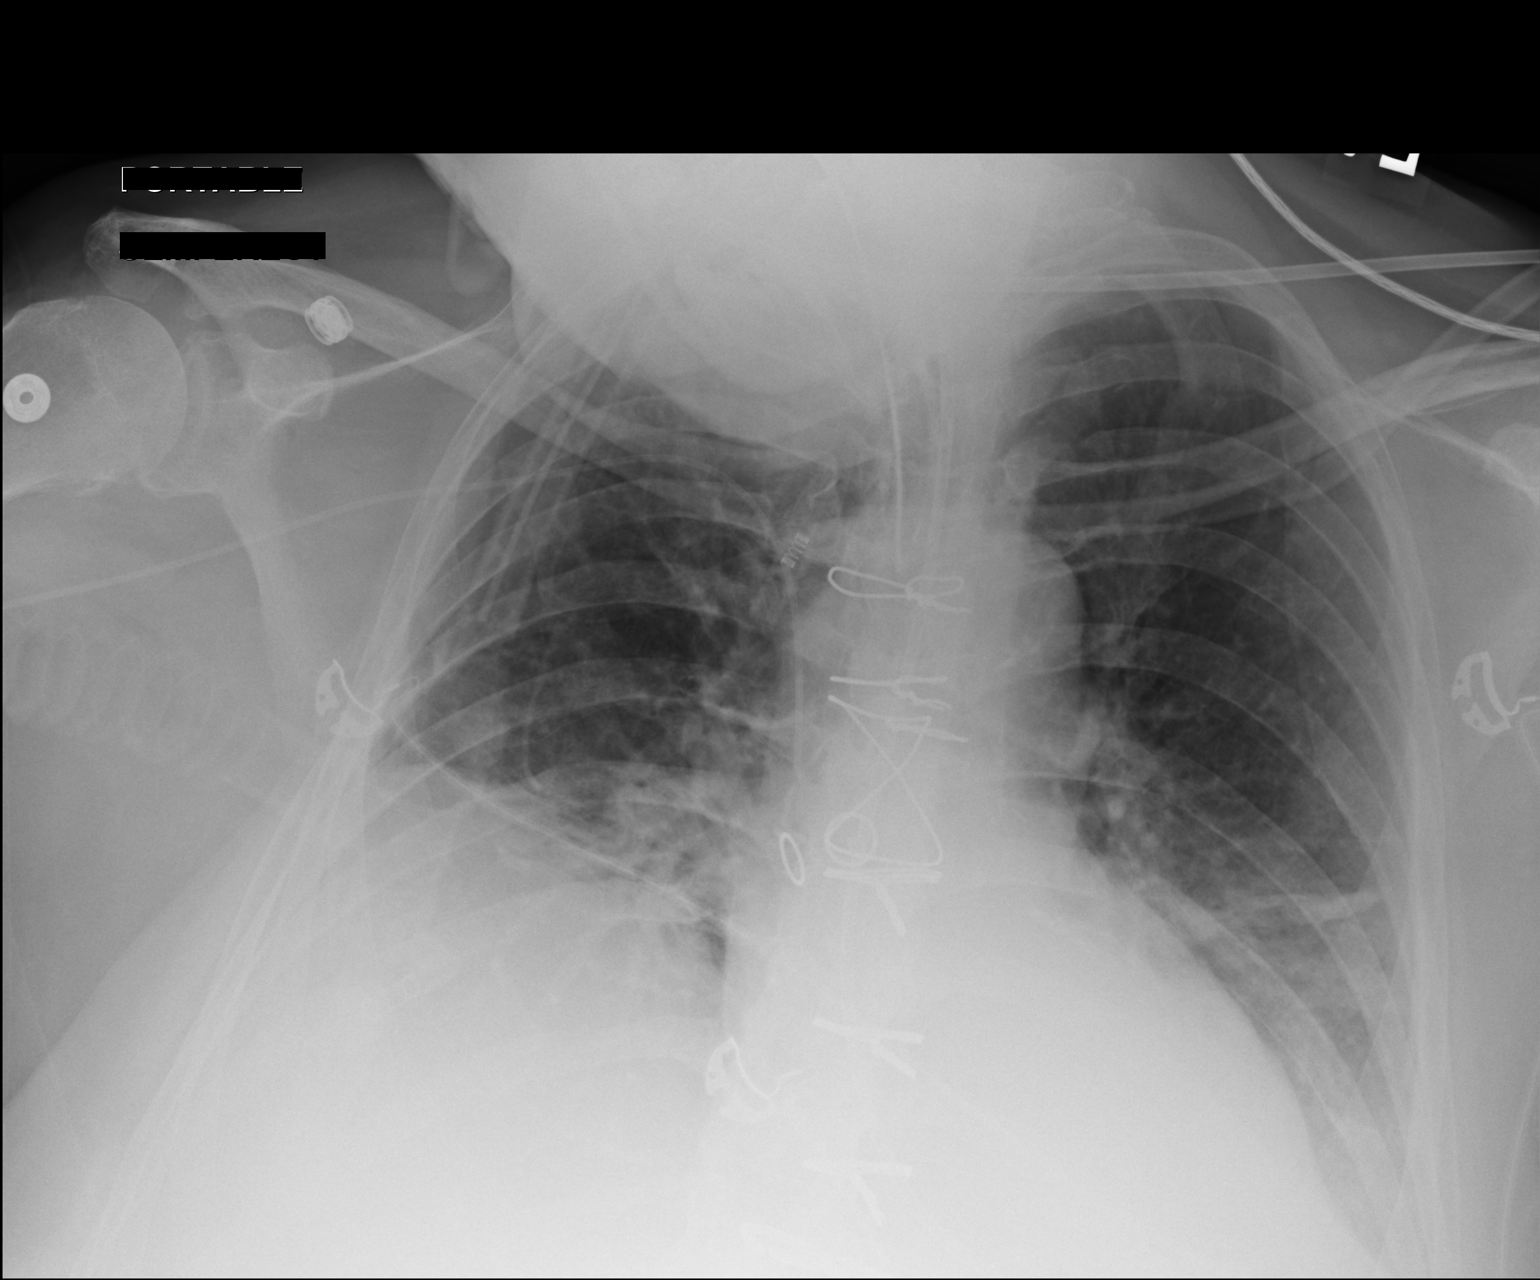

[1 of 1 positions shown; findings below may reference images not displayed]

FINDINGS: The tip of the endotracheal tube is approximately 3.0 cm above the
carina. A right PICC line remains with the tip overlying the mid
SVC. Bibasilar opacities persist some a which is due to atelectasis
but pneumonia particularly in the left lung base cannot be excluded.
Cardiomegaly is stable.
IMPRESSION: 1. Tip of endotracheal tube 3.0 cm above the carina.
2. PICC line tip overlies the mid SVC.
3. Persistent bibasilar opacities consistent with atelectasis.
Pneumonia at the left lung base cannot be excluded

## 2015-06-27 ENCOUNTER — Other Ambulatory Visit (HOSPITAL_COMMUNITY): Payer: Self-pay

## 2015-06-27 ENCOUNTER — Ambulatory Visit: Payer: Self-pay | Admitting: Surgery
